# Patient Record
Sex: Male | Born: 1985 | Race: Black or African American | Hispanic: No | Smoking: Current every day smoker
Health system: Southern US, Community
[De-identification: ages and names within clinical notes are randomized; demographics above are authoritative.]

## PROBLEM LIST (undated history)

## (undated) DIAGNOSIS — I10 Essential (primary) hypertension: Secondary | ICD-10-CM

## (undated) DIAGNOSIS — F1910 Other psychoactive substance abuse, uncomplicated: Secondary | ICD-10-CM

## (undated) DIAGNOSIS — K219 Gastro-esophageal reflux disease without esophagitis: Secondary | ICD-10-CM

## (undated) DIAGNOSIS — R569 Unspecified convulsions: Secondary | ICD-10-CM

## (undated) DIAGNOSIS — J45909 Unspecified asthma, uncomplicated: Secondary | ICD-10-CM

## (undated) DIAGNOSIS — K859 Acute pancreatitis without necrosis or infection, unspecified: Secondary | ICD-10-CM

## (undated) DIAGNOSIS — F1020 Alcohol dependence, uncomplicated: Secondary | ICD-10-CM

## (undated) HISTORY — PX: NO PAST SURGERIES: SHX2092

---

## 2004-05-09 ENCOUNTER — Emergency Department: Payer: Self-pay | Admitting: Emergency Medicine

## 2011-08-25 ENCOUNTER — Emergency Department: Payer: Self-pay | Admitting: Internal Medicine

## 2014-08-11 ENCOUNTER — Emergency Department: Payer: Self-pay | Admitting: Student

## 2014-08-13 ENCOUNTER — Emergency Department: Payer: Self-pay | Admitting: Emergency Medicine

## 2014-10-01 LAB — COMPREHENSIVE METABOLIC PANEL
ALT: 60 U/L
ANION GAP: 18 — AB (ref 7–16)
Albumin: 4 g/dL
Alkaline Phosphatase: 159 U/L — ABNORMAL HIGH
BUN: 6 mg/dL
Bilirubin,Total: 0.8 mg/dL
CO2: 22 mmol/L
CREATININE: 0.67 mg/dL
Calcium, Total: 8.9 mg/dL
Chloride: 97 mmol/L — ABNORMAL LOW
Glucose: 126 mg/dL — ABNORMAL HIGH
POTASSIUM: 2.9 mmol/L — AB
SGOT(AST): 172 U/L — ABNORMAL HIGH
SODIUM: 137 mmol/L
Total Protein: 7.9 g/dL

## 2014-10-01 LAB — CBC WITH DIFFERENTIAL/PLATELET
BASOS PCT: 0.3 %
Basophil #: 0.1 10*3/uL (ref 0.0–0.1)
EOS PCT: 0 %
Eosinophil #: 0 10*3/uL (ref 0.0–0.7)
HCT: 52 % (ref 40.0–52.0)
HGB: 17.4 g/dL (ref 13.0–18.0)
Lymphocyte #: 1.9 10*3/uL (ref 1.0–3.6)
Lymphocyte %: 11.1 %
MCH: 32.9 pg (ref 26.0–34.0)
MCHC: 33.4 g/dL (ref 32.0–36.0)
MCV: 99 fL (ref 80–100)
Monocyte #: 0.6 x10 3/mm (ref 0.2–1.0)
Monocyte %: 3.8 %
NEUTROS PCT: 84.8 %
Neutrophil #: 14.1 10*3/uL — ABNORMAL HIGH (ref 1.4–6.5)
Platelet: 206 10*3/uL (ref 150–440)
RBC: 5.28 10*6/uL (ref 4.40–5.90)
RDW: 15.4 % — ABNORMAL HIGH (ref 11.5–14.5)
WBC: 16.7 10*3/uL — AB (ref 3.8–10.6)

## 2014-10-01 LAB — TROPONIN I: Troponin-I: <0.03 ng/mL

## 2014-10-01 LAB — LIPASE, BLOOD: Lipase: 189 U/L — ABNORMAL HIGH

## 2014-10-02 ENCOUNTER — Inpatient Hospital Stay
Admit: 2014-10-02 | Disposition: A | Discharge status: Home / Self Care | Payer: Self-pay | Attending: Internal Medicine | Admitting: Internal Medicine

## 2014-10-02 LAB — BASIC METABOLIC PANEL
ANION GAP: 8 (ref 7–16)
CREATININE: 0.68 mg/dL
Calcium, Total: 8 mg/dL — ABNORMAL LOW
Chloride: 101 mmol/L
Co2: 27 mmol/L
EGFR (African American): >60
EGFR (Non-African Amer.): >60
GLUCOSE: 102 mg/dL — AB
POTASSIUM: 3.6 mmol/L
SODIUM: 136 mmol/L

## 2014-10-02 LAB — URINALYSIS, COMPLETE
BLOOD: NEGATIVE
Bilirubin,UR: NEGATIVE
Glucose,UR: 50 mg/dL (ref 0–75)
Hyaline Cast: 16
LEUKOCYTE ESTERASE: NEGATIVE
NITRITE: NEGATIVE
Ph: 5 (ref 4.5–8.0)
Protein: 100
RBC,UR: 1 /HPF (ref 0–5)
Specific Gravity: 1.026 (ref 1.003–1.030)
Squamous Epithelial: <1

## 2014-10-02 LAB — ETHANOL: Ethanol: 114 mg/dL

## 2014-10-02 LAB — DRUG SCREEN, URINE
Amphetamines, Ur Screen: NEGATIVE
Barbiturates, Ur Screen: NEGATIVE
Benzodiazepine, Ur Scrn: POSITIVE
Cannabinoid 50 Ng, Ur ~~LOC~~: POSITIVE
Cocaine Metabolite,Ur ~~LOC~~: NEGATIVE
MDMA (Ecstasy)Ur Screen: NEGATIVE
METHADONE, UR SCREEN: NEGATIVE
OPIATE, UR SCREEN: POSITIVE
PHENCYCLIDINE (PCP) UR S: NEGATIVE
TRICYCLIC, UR SCREEN: NEGATIVE

## 2014-10-02 LAB — LIPASE, BLOOD: LIPASE: 132 U/L — AB

## 2014-10-02 LAB — CLOSTRIDIUM DIFFICILE(ARMC)

## 2014-10-03 LAB — CBC WITH DIFFERENTIAL/PLATELET
BASOS ABS: 0 10*3/uL (ref 0.0–0.1)
BASOS PCT: 0.2 %
EOS ABS: 0.2 10*3/uL (ref 0.0–0.7)
Eosinophil %: 1.4 %
HCT: 36.6 % — ABNORMAL LOW (ref 40.0–52.0)
HGB: 12.3 g/dL — AB (ref 13.0–18.0)
Lymphocyte #: 1.4 10*3/uL (ref 1.0–3.6)
Lymphocyte %: 9.5 %
MCH: 32.8 pg (ref 26.0–34.0)
MCHC: 33.6 g/dL (ref 32.0–36.0)
MCV: 98 fL (ref 80–100)
Monocyte #: 0.8 x10 3/mm (ref 0.2–1.0)
Monocyte %: 5.4 %
NEUTROS PCT: 83.5 %
Neutrophil #: 12.4 10*3/uL — ABNORMAL HIGH (ref 1.4–6.5)
Platelet: 114 10*3/uL — ABNORMAL LOW (ref 150–440)
RBC: 3.75 10*6/uL — ABNORMAL LOW (ref 4.40–5.90)
RDW: 15 % — AB (ref 11.5–14.5)
WBC: 14.8 10*3/uL — ABNORMAL HIGH (ref 3.8–10.6)

## 2014-10-03 LAB — MAGNESIUM: Magnesium: 1.4 mg/dL — ABNORMAL LOW

## 2014-10-03 LAB — BASIC METABOLIC PANEL
Anion Gap: 10 (ref 7–16)
BUN: <5 mg/dL
CALCIUM: 8.3 mg/dL — AB
Chloride: 101 mmol/L
Co2: 27 mmol/L
Creatinine: 0.64 mg/dL
EGFR (African American): >60
EGFR (Non-African Amer.): >60
Glucose: 57 mg/dL — ABNORMAL LOW
POTASSIUM: 2.8 mmol/L — AB
SODIUM: 138 mmol/L

## 2014-10-03 LAB — LIPASE, BLOOD: LIPASE: 89 U/L — AB

## 2014-10-04 LAB — WBCS, STOOL

## 2014-10-06 LAB — STOOL CULTURE

## 2014-10-07 LAB — CULTURE, BLOOD (SINGLE)

## 2014-10-30 NOTE — H&P (Signed)
PATIENT NAME:  Johnathan Rivera, Johnathan Rivera MR#:  [id number] DATE OF BIRTH:  Jul 04, 1985  DATE OF ADMISSION:  10/02/2014  REFERRING DOCTOR: Caryn Bee A. Paduchowski, MD  PRIMARY CARE PHYSICIAN: None.   ADMITTING DOCTOR: Crissie Figures, MD  CHIEF COMPLAINT: Abdominal pain with associated nausea, vomiting and diarrhea since the morning.    HISTORY OF PRESENT ILLNESS: This is a 29 year old African American male with a past medical history of hypertension, not on any medications; alcohol abuse; history of recreational drug usage, that is marijuana usage, who presents to the Emergency Room with complaints of epigastric pain associated with nausea, vomiting and diarrhea since yesterday morning. The patient stated that he consumed alcohol that is liquor 24 hours prior following which he developed some upper abdominal pain with associated nausea and vomiting and also developed some diarrhea. Hence, he came to the Emergency Room for further evaluation. He denies any fever. No blood in the vomitus or stools. No mucus in the vomitus or stools. The patient stated he has been doing fine before the onset of the symptoms. He denies any urinary symptoms. No chest pain. No shortness of breath. No cough. No focal weakness or numbness. No headaches. In the Emergency Room, the patient was evaluated by the ED physician and was noted to have lab abnormalities significant for leukocytosis, elevated lipase of 189 and elevated liver functions with an AST of 172 and alkaline phosphatase of 159. The patient underwent a right upper quadrant abdominal sonogram which was reported as heterogeneous appearance of the pancreas, consistent with acute pancreatitis. The patient was also noted to have hypokalemia. In view of these abnormalities, the hospitalist service was consulted for further evaluation and management. The patient received multiple doses of IV pain medications and IV fluids in the Emergency Room. He still remains tachycardic, but he  is afebrile and the blood pressure is slightly high in the range of 185/120s.   PAST MEDICAL HISTORY:  1.  Hypertension, not on any medications.  2.  Alcohol usage on a daily basis.  3.  History of marijuana usage.  4.  History of anxiety disorder, not on any medications.   PAST SURGICAL HISTORY: No history of any surgeries.   HOME MEDICATIONS: No home medications.   ALLERGIES: No known drug allergies.   SOCIAL HISTORY: He is married but in the process of getting divorced. He lives at home and works at [employer]. He has a history of smoking of less than 1 pack per day and history of alcohol usage on a daily basis. He uses marijuana. The last time he used was yesterday. Denies any IV drug usage.   FAMILY HISTORY: Significant for father and paternal grandmother with some cancers. Diabetes and hypertension runs on the maternal side.   REVIEW OF SYSTEMS:  CONSTITUTIONAL: Negative for fever, chills, fatigue or generalized weakness.  EYES: Negative for blurred vision or double vision. No pain. No redness. No discharge.  ENT: Negative for ear pain, tinnitus, hearing loss, epistaxis or nasal discharge.  RESPIRATORY: Negative for cough, wheezing, dyspnea, hemoptysis or painful respiration.  CARDIOVASCULAR: Negative for chest pain, palpitations, dizziness, syncopal episodes, orthopnea, dyspnea on exertion or pedal edema.  GASTROINTESTINAL: Positive for nausea, vomiting or diarrhea. Abdominal pain since from yesterday morning as noted in the history of present illness. No blood or mucus in the stools.  GENITOURINARY: Negative for dysuria, frequency, urgency or hematuria.  ENDOCRINE: Negative for polyuria, nocturia or heat or cold intolerance.  HEMATOLOGIC/LYMPHATIC: Negative for any easy bruising, bleeding or  swollen glands.  INTEGUMENTARY: Negative for acne, skin rash or lesions.  MUSCULOSKELETAL: Negative for neck or back pain. No history of arthritis or gout.  NEUROLOGIC: Negative for focal  weakness or numbness. No history of CVA, TIA or seizure disorder.  PSYCHIATRIC: Positive for history of anxiety disorder, not on any medications.   PHYSICAL EXAMINATION:  VITAL SIGNS: Temperature 97.3 degrees Fahrenheit, pulse rate on arrival 143 per minute, current pulse rate 114 per minute, respirations 18 per minute, blood pressure 149/105, O2 saturation 97% on room air.  GENERAL: Well developed, well nourished, alert lying in the bed, in mild distress because of abdominal pain.  HEENT: Head: Atraumatic, normocephalic. Eyes: Pupils are equal and reactive to light and accommodation. No conjunctival pallor. No icterus. Extraocular movements intact. Nose: No drainage. No lesions. Ears: No drainage. No external lesions. Oral cavity: No mucosal lesions. No exudates.  NECK: Supple. No JVD. No thyromegaly. No carotid bruits. Range of motion of neck within normal limits.  RESPIRATORY: Good respiratory effort. Not using accessory muscles of respiration. Bilateral vesicular breath sounds present. No rales or rhonchi.  CARDIOVASCULAR: S1, S2 regular. Tachycardia present. No murmurs, gallops or clicks. Pulses equal at carotid, scattered, femoral and pedal pulses. No peripheral edema.  GASTROINTESTINAL: Abdomen is soft. Diffuse tenderness in the epigastrium present. No guarding. No rigidity. Bowel sounds present and equal in all 4 quadrants.  GENITOURINARY: Deferred.  MUSCULOSKELETAL: No joint tenderness or effusion. Range of motion is adequate. Strength and tone equal bilaterally.  SKIN: Inspection within normal limits. No obvious wounds.  LYMPHATIC: No cervical lymphadenopathy.  VASCULAR: Good dorsalis pedis and posterior tibial pulses.  NEUROLOGIC: Alert, awake and oriented x3. Cranial nerves II through XII grossly intact. No sensory deficit. Motor strength is 5/5 in both upper and lower extremities. DTRs 2+ bilaterally and symmetrical. Plantars are downgoing.  PSYCHIATRIC: Alert, awake and oriented x3.  Judgment and insight adequate. Memory and mood within normal limits.   ANCILLARY DATA:  LABORATORY DATA: Serum glucose 126, BUN 6, creatinine 0.67, sodium 137, potassium 2.9, chloride 97, bicarbonate 22, total calcium 8.9. Lipase 189. Total protein 7.9, albumin 4.0, total bilirubin 0.8, alkaline phosphatase 159, AST 172, ALT 60. Troponin less than 0.03. WBC 16.7, hemoglobin 17.4, hematocrit 52.0, platelet count 206,000.   IMAGING STUDIES:  ULTRASOUND OF THE RIGHT UPPER QUADRANT:  1.  Gallbladder unremarkable in appearance.  2.  Somewhat heterogeneous appearance of the pancreas with trace free fluid surrounding the liver above the pancreas and in the right lower quadrant, likely reflects acute pancreatitis.  3.  Diffuse fatty infiltration within the liver.   EKG: Sinus tachycardia with a ventricular rate of 118 beats per minute. No acute ST-T changes.    ASSESSMENT AND PLAN: This is a 29 year old African American male with a history of alcohol abuse, hypertension and marijuana usage who presents with epigastric abdominal pain with associated nausea, vomiting and diarrhea of 1 day duration who was found to have an elevated lipase, leukocytosis and right upper quadrant sonogram findings consistent with acute pancreatitis.  1.  Acute pancreatitis, likely secondary due to chronic ethanol usage. The plan is to admit to telemetry, n.p.o., intravenous fluids, followup lipase.  2.  Nausea, vomiting and diarrhea of 1-day duration, likely gastritis secondary due to chronic alcohol usage. Rule out infectious causes of diarrhea. The plan is for IV hydration. We will keep him n.p.o. Check stool for stool culture and sensitivity, WBCs and Clostridium difficile toxin.  3.  Hypokalemia, secondary to gastrointestinal losses. The plan  is to replace potassium, telemetry monitoring and follow BMP.  4.  Ethanol usage on a daily base. The plan is to check alcohol levels. We will place on CIWA protocol and monitor closely  for any withdrawal.  5.  History of hypertension, not on any medications. The plan for blood pressure mild elevation is that we will continue with CIWA protocol and use clonidine as needed. Monitor blood pressure closely.  6.  History of marijuana usage, last used 24 hours prior. The plan is to check a urine drug screen and follow up accordingly.  7.  Leukocytosis. No history of fever. No recent illness. No symptoms suggestive of any infection. Likely secondary to stress. We will obtain blood cultures, monitor temperature curves and follow up CBC.  8.  Deep vein thrombosis prophylaxis, on subcutaneous Lovenox.  9.  Gastrointestinal prophylaxis, on PPI.   CODE STATUS: Full code.   TIME SPENT: 50 minutes.    ____________________________ Crissie Figures, MD enr:JT D: 10/02/2014 04:04:05 ET T: 10/02/2014 07:27:56 ET JOB#: [id number]  cc: Crissie Figures, MD, <Dictator> Crissie Figures MD ELECTRONICALLY SIGNED 10/02/2014 18:28

## 2014-10-30 NOTE — Discharge Summary (Signed)
PATIENT NAME:  Johnathan Rivera, Johnathan M MR#:  [id number] DATE OF BIRTH:  Nov 21, 1985  DATE OF ADMISSION:  10/02/2014 DATE OF DISCHARGE:  10/03/2014  ADMISSION DIAGNOSIS:  Acute pancreatitis.  DISCHARGE DIAGNOSES:   1.  Acute pancreatitis.  2.  Alcohol dependence. 3.  Electrolyte abnormalities including low potassium and magnesium.   CONSULTATIONS: None.   PHYSICAL EXAMINATION AT DISCHARGE:  VITAL SIGNS: Temperature is 98.9, pulse is 97, respirations 18, blood pressure 150/89, 99 room air.  GENERAL: The patient was alert, oriented, and in acute distress.  ABDOMEN: Bowel sounds positive. Nontender, nondistended. No hepatosplenomegaly. No organomegaly. No rebound or guarding.  CARDIOVASCULAR: Regular rate and rhythm. No murmurs, gallops, or rubs.  NEUROLOGIC:  Cranial nerves II through XII are intact. No focal deficits. LUNGS:  Clear to auscultation without crackles, rales, or rhonchi.   LABORATORY DATA:  White blood cells 14.8, hemoglobin 12.3, hematocrit 37, platelets are 114, 000.  Sodium 138, potassium 2.8 which was repleted prior to discharge, chloride 101, bicarbonate 27, BUN less than 5, creatinine 0.64, lipase 89.  Magnesium 1.4.   Stool and Clostridium difficile were negative.   Blood culture negative.   HOSPITAL COURSE: A 29 year old male with alcohol dependence who presented with abdominal pain, found to have acute pancreatitis. For further details, please see further dictation.   1.  Acute pancreatitis. The patient had a right upper quadrant ultrasound which showed no evidence of gallstones, but was consistent with pancreatitis. He was placed on pancreatitis protocol including n.p.o., IV fluids, pain control. He was tolerating his diet, abdominal pain free.  His pancreatitis was secondary to alcohol dependence. 2.  Alcohol dependency.  Patient was placed on CIWA protocol.  3.  Electrolyte abnormalities due to alcohol dependency as well as pancreatitis which was repleted  aggressively.   DISCHARGE MEDICATIONS: None.   DISCHARGE DIET: Regular.   DISCHARGE ACTIVITY: As tolerated.   DISCHARGE FOLLOWUP:  The patient will need to follow up with his PCP in the community. The patient was encouraged to stop drinking and case manager was consulted to hand out information to this patient for alcohol dependence. The patient will be discharged.   TIME SPENT: Approximately 40 minutes on discharge.    ____________________________ Meylin Stenzel P. Juliene PinaMody, MD spm:sp D: 10/03/2014 11:48:32 ET T: 10/03/2014 12:10:00 ET JOB#: [id number]  cc: Mathew Storck P. Juliene PinaMody, MD, <Dictator> Janyth ContesSITAL P Nasirah Sachs MD ELECTRONICALLY SIGNED 10/03/2014 12:43

## 2015-01-04 ENCOUNTER — Inpatient Hospital Stay: Payer: Self-pay

## 2015-01-04 ENCOUNTER — Inpatient Hospital Stay
Admission: EM | Admit: 2015-01-04 | Discharge: 2015-01-06 | DRG: 982 | Disposition: A | Discharge status: Home / Self Care | Payer: Self-pay | Attending: Internal Medicine | Admitting: Internal Medicine

## 2015-01-04 DIAGNOSIS — F1020 Alcohol dependence, uncomplicated: Secondary | ICD-10-CM | POA: Diagnosis present

## 2015-01-04 DIAGNOSIS — F10239 Alcohol dependence with withdrawal, unspecified: Secondary | ICD-10-CM | POA: Diagnosis present

## 2015-01-04 DIAGNOSIS — S43492A Other sprain of left shoulder joint, initial encounter: Secondary | ICD-10-CM | POA: Diagnosis present

## 2015-01-04 DIAGNOSIS — F1010 Alcohol abuse, uncomplicated: Secondary | ICD-10-CM

## 2015-01-04 DIAGNOSIS — Z6823 Body mass index (BMI) 23.0-23.9, adult: Secondary | ICD-10-CM

## 2015-01-04 DIAGNOSIS — W1830XA Fall on same level, unspecified, initial encounter: Secondary | ICD-10-CM | POA: Diagnosis present

## 2015-01-04 DIAGNOSIS — R569 Unspecified convulsions: Principal | ICD-10-CM

## 2015-01-04 DIAGNOSIS — F1910 Other psychoactive substance abuse, uncomplicated: Secondary | ICD-10-CM | POA: Insufficient documentation

## 2015-01-04 DIAGNOSIS — F411 Generalized anxiety disorder: Secondary | ICD-10-CM | POA: Diagnosis present

## 2015-01-04 DIAGNOSIS — F410 Panic disorder [episodic paroxysmal anxiety] without agoraphobia: Secondary | ICD-10-CM | POA: Diagnosis present

## 2015-01-04 DIAGNOSIS — F419 Anxiety disorder, unspecified: Secondary | ICD-10-CM | POA: Diagnosis present

## 2015-01-04 DIAGNOSIS — F17210 Nicotine dependence, cigarettes, uncomplicated: Secondary | ICD-10-CM | POA: Diagnosis present

## 2015-01-04 DIAGNOSIS — R52 Pain, unspecified: Secondary | ICD-10-CM

## 2015-01-04 DIAGNOSIS — S01512A Laceration without foreign body of oral cavity, initial encounter: Secondary | ICD-10-CM | POA: Diagnosis present

## 2015-01-04 DIAGNOSIS — E872 Acidosis: Secondary | ICD-10-CM | POA: Diagnosis present

## 2015-01-04 DIAGNOSIS — K760 Fatty (change of) liver, not elsewhere classified: Secondary | ICD-10-CM | POA: Diagnosis present

## 2015-01-04 DIAGNOSIS — Y92009 Unspecified place in unspecified non-institutional (private) residence as the place of occurrence of the external cause: Secondary | ICD-10-CM

## 2015-01-04 DIAGNOSIS — R7989 Other specified abnormal findings of blood chemistry: Secondary | ICD-10-CM | POA: Insufficient documentation

## 2015-01-04 DIAGNOSIS — F10280 Alcohol dependence with alcohol-induced anxiety disorder: Secondary | ICD-10-CM

## 2015-01-04 DIAGNOSIS — R000 Tachycardia, unspecified: Secondary | ICD-10-CM

## 2015-01-04 DIAGNOSIS — K7010 Alcoholic hepatitis without ascites: Secondary | ICD-10-CM | POA: Diagnosis present

## 2015-01-04 DIAGNOSIS — R945 Abnormal results of liver function studies: Secondary | ICD-10-CM

## 2015-01-04 DIAGNOSIS — E876 Hypokalemia: Secondary | ICD-10-CM | POA: Diagnosis present

## 2015-01-04 DIAGNOSIS — F10939 Alcohol use, unspecified with withdrawal, unspecified: Secondary | ICD-10-CM | POA: Diagnosis present

## 2015-01-04 DIAGNOSIS — S4292XA Fracture of left shoulder girdle, part unspecified, initial encounter for closed fracture: Secondary | ICD-10-CM | POA: Diagnosis present

## 2015-01-04 HISTORY — DX: Other psychoactive substance abuse, uncomplicated: F19.10

## 2015-01-04 HISTORY — DX: Unspecified convulsions: R56.9

## 2015-01-04 LAB — URINE DRUG SCREEN, QUALITATIVE (ARMC ONLY)
AMPHETAMINES, UR SCREEN: POSITIVE — AB
Barbiturates, Ur Screen: NOT DETECTED
Benzodiazepine, Ur Scrn: POSITIVE — AB
Cannabinoid 50 Ng, Ur ~~LOC~~: POSITIVE — AB
Cocaine Metabolite,Ur ~~LOC~~: NOT DETECTED
MDMA (Ecstasy)Ur Screen: NOT DETECTED
Methadone Scn, Ur: POSITIVE — AB
OPIATE, UR SCREEN: NOT DETECTED
Phencyclidine (PCP) Ur S: NOT DETECTED
Tricyclic, Ur Screen: POSITIVE — AB

## 2015-01-04 LAB — BASIC METABOLIC PANEL
Anion gap: 12 (ref 5–15)
BUN: 5 mg/dL — AB (ref 6–20)
CALCIUM: 7.9 mg/dL — AB (ref 8.9–10.3)
CO2: 23 mmol/L (ref 22–32)
Chloride: 98 mmol/L — ABNORMAL LOW (ref 101–111)
Creatinine, Ser: 0.75 mg/dL (ref 0.61–1.24)
GFR calc Af Amer: >60 mL/min (ref >60)
GFR calc non Af Amer: >60 mL/min (ref >60)
GLUCOSE: 74 mg/dL (ref 65–99)
Potassium: 3 mmol/L — ABNORMAL LOW (ref 3.5–5.1)
Sodium: 133 mmol/L — ABNORMAL LOW (ref 135–145)

## 2015-01-04 LAB — COMPREHENSIVE METABOLIC PANEL
ALBUMIN: 4.4 g/dL (ref 3.5–5.0)
ALK PHOS: 173 U/L — AB (ref 38–126)
ALT: 127 U/L — ABNORMAL HIGH (ref 17–63)
AST: 1064 U/L — AB (ref 15–41)
Anion gap: 23 — ABNORMAL HIGH (ref 5–15)
BILIRUBIN TOTAL: 1 mg/dL (ref 0.3–1.2)
BUN: 5 mg/dL — AB (ref 6–20)
CO2: 17 mmol/L — ABNORMAL LOW (ref 22–32)
CREATININE: 0.92 mg/dL (ref 0.61–1.24)
Calcium: 9.5 mg/dL (ref 8.9–10.3)
Chloride: 96 mmol/L — ABNORMAL LOW (ref 101–111)
GFR calc Af Amer: >60 mL/min (ref >60)
GFR calc non Af Amer: >60 mL/min (ref >60)
Glucose, Bld: 97 mg/dL (ref 65–99)
Potassium: 3.4 mmol/L — ABNORMAL LOW (ref 3.5–5.1)
Sodium: 136 mmol/L (ref 135–145)
Total Protein: 8.2 g/dL — ABNORMAL HIGH (ref 6.5–8.1)

## 2015-01-04 LAB — LACTIC ACID, PLASMA: Lactic Acid, Venous: 0.8 mmol/L (ref 0.5–2.0)

## 2015-01-04 LAB — MRSA PCR SCREENING: MRSA by PCR: NEGATIVE

## 2015-01-04 LAB — GLUCOSE, CAPILLARY
GLUCOSE-CAPILLARY: 94 mg/dL (ref 65–99)
Glucose-Capillary: 81 mg/dL (ref 65–99)

## 2015-01-04 LAB — ACETAMINOPHEN LEVEL: Acetaminophen (Tylenol), Serum: <10 ug/mL — ABNORMAL LOW (ref 10–30)

## 2015-01-04 LAB — SALICYLATE LEVEL: Salicylate Lvl: <4 mg/dL (ref 2.8–30.0)

## 2015-01-04 LAB — MAGNESIUM: Magnesium: 1.9 mg/dL (ref 1.7–2.4)

## 2015-01-04 LAB — CBC
HCT: 43.4 % (ref 40.0–52.0)
Hemoglobin: 15 g/dL (ref 13.0–18.0)
MCH: 33.7 pg (ref 26.0–34.0)
MCHC: 34.5 g/dL (ref 32.0–36.0)
MCV: 97.8 fL (ref 80.0–100.0)
PLATELETS: 211 10*3/uL (ref 150–440)
RBC: 4.44 MIL/uL (ref 4.40–5.90)
RDW: 14.7 % — ABNORMAL HIGH (ref 11.5–14.5)
WBC: 12.7 10*3/uL — AB (ref 3.8–10.6)

## 2015-01-04 LAB — ETHANOL: ALCOHOL ETHYL (B): 32 mg/dL — AB (ref <5)

## 2015-01-04 LAB — TROPONIN I
Troponin I: <0.03 ng/mL (ref <0.031)
Troponin I: <0.03 ng/mL (ref <0.031)

## 2015-01-04 LAB — RAPID HIV SCREEN (HIV 1/2 AB+AG)
HIV 1/2 Antibodies: NONREACTIVE
HIV-1 P24 Antigen - HIV24: NONREACTIVE

## 2015-01-04 LAB — LIPASE, BLOOD: LIPASE: 68 U/L — AB (ref 22–51)

## 2015-01-04 LAB — PHOSPHORUS: PHOSPHORUS: 2.7 mg/dL (ref 2.5–4.6)

## 2015-01-04 LAB — PROTIME-INR
INR: 0.99
PROTHROMBIN TIME: 13.3 s (ref 11.4–15.0)

## 2015-01-04 MED ORDER — FOLIC ACID 1 MG PO TABS
1.0000 mg | ORAL_TABLET | Freq: Every day | ORAL | Status: DC
  Administered 2015-01-04 – 2015-01-06 (×3): 1 mg via ORAL
  Filled 2015-01-04 (×3): qty 1

## 2015-01-04 MED ORDER — DEXMEDETOMIDINE HCL IN NACL 400 MCG/100ML IV SOLN
0.2000 ug/kg/h | INTRAVENOUS | Status: AC
  Administered 2015-01-04: 0.5 ug/kg/h via INTRAVENOUS
  Filled 2015-01-04: qty 100
  Filled 2015-01-04: qty 50
  Filled 2015-01-04 (×2): qty 100

## 2015-01-04 MED ORDER — LORAZEPAM 2 MG/ML IJ SOLN
INTRAMUSCULAR | Status: AC
  Filled 2015-01-04: qty 1

## 2015-01-04 MED ORDER — LORAZEPAM 2 MG/ML IJ SOLN: 1.0000 mg | Freq: Once | INTRAMUSCULAR | Status: DC

## 2015-01-04 MED ORDER — LORAZEPAM 2 MG/ML IJ SOLN: 1.0000 mg | Freq: Four times a day (QID) | INTRAMUSCULAR | Status: DC | PRN

## 2015-01-04 MED ORDER — AMOXICILLIN-POT CLAVULANATE 875-125 MG PO TABS
1.0000 | ORAL_TABLET | Freq: Two times a day (BID) | ORAL | Status: DC
  Administered 2015-01-04 – 2015-01-05 (×3): 1 via ORAL
  Filled 2015-01-04 (×5): qty 1

## 2015-01-04 MED ORDER — ACETAMINOPHEN 325 MG PO TABS
650.0000 mg | ORAL_TABLET | Freq: Four times a day (QID) | ORAL | Status: DC | PRN
  Administered 2015-01-05 (×2): 650 mg via ORAL
  Filled 2015-01-04 (×2): qty 2

## 2015-01-04 MED ORDER — POTASSIUM CHLORIDE CRYS ER 20 MEQ PO TBCR
40.0000 meq | EXTENDED_RELEASE_TABLET | ORAL | Status: AC
  Administered 2015-01-04 (×3): 40 meq via ORAL
  Filled 2015-01-04 (×2): qty 2

## 2015-01-04 MED ORDER — LORAZEPAM 2 MG/ML IJ SOLN
2.0000 mg | INTRAMUSCULAR | Status: DC | PRN
  Administered 2015-01-05: 2 mg via INTRAVENOUS
  Filled 2015-01-04: qty 1

## 2015-01-04 MED ORDER — ONDANSETRON HCL 4 MG PO TABS: 4.0000 mg | ORAL_TABLET | Freq: Four times a day (QID) | ORAL | Status: DC | PRN

## 2015-01-04 MED ORDER — LIDOCAINE-EPINEPHRINE 2 %-1:100000 IJ SOLN
1.7000 mL | Freq: Once | INTRAMUSCULAR | Status: AC
Start: 2015-01-04 — End: 2015-01-04
  Administered 2015-01-04: 1.7 mL via INTRADERMAL
  Filled 2015-01-04: qty 1.7

## 2015-01-04 MED ORDER — VITAMIN B-1 100 MG PO TABS: 100.0000 mg | ORAL_TABLET | Freq: Every day | ORAL | Status: DC

## 2015-01-04 MED ORDER — LORAZEPAM 2 MG/ML IJ SOLN
1.0000 mg | Freq: Once | INTRAMUSCULAR | Status: AC
  Administered 2015-01-04: 1 mg via INTRAVENOUS

## 2015-01-04 MED ORDER — NITROGLYCERIN 2 % TD OINT
0.5000 [in_us] | TOPICAL_OINTMENT | Freq: Four times a day (QID) | TRANSDERMAL | Status: DC
  Administered 2015-01-04 – 2015-01-06 (×8): 0.5 [in_us] via TOPICAL
  Filled 2015-01-04 (×7): qty 1

## 2015-01-04 MED ORDER — VITAMIN B-1 100 MG PO TABS
100.0000 mg | ORAL_TABLET | Freq: Every day | ORAL | Status: DC
  Administered 2015-01-04 – 2015-01-06 (×3): 100 mg via ORAL
  Filled 2015-01-04 (×3): qty 1

## 2015-01-04 MED ORDER — LORAZEPAM 2 MG/ML IJ SOLN
INTRAMUSCULAR | Status: AC
  Administered 2015-01-04: 1 mg via INTRAVENOUS
  Filled 2015-01-04: qty 1

## 2015-01-04 MED ORDER — ONDANSETRON HCL 4 MG/2ML IJ SOLN: 4.0000 mg | Freq: Four times a day (QID) | INTRAMUSCULAR | Status: DC | PRN

## 2015-01-04 MED ORDER — THIAMINE HCL 100 MG/ML IJ SOLN: 100.0000 mg | Freq: Every day | INTRAMUSCULAR | Status: DC

## 2015-01-04 MED ORDER — SODIUM CHLORIDE 0.9 % IV BOLUS (SEPSIS)
1000.0000 mL | Freq: Once | INTRAVENOUS | Status: AC
  Administered 2015-01-04: 1000 mL via INTRAVENOUS

## 2015-01-04 MED ORDER — ACETAMINOPHEN 650 MG RE SUPP: 650.0000 mg | Freq: Four times a day (QID) | RECTAL | Status: DC | PRN

## 2015-01-04 MED ORDER — MAGNESIUM HYDROXIDE 400 MG/5ML PO SUSP
30.0000 mL | Freq: Every day | ORAL | Status: DC | PRN
Start: 2015-01-04 — End: 2015-01-06

## 2015-01-04 MED ORDER — ADULT MULTIVITAMIN W/MINERALS CH: 1.0000 | ORAL_TABLET | Freq: Every day | ORAL | Status: DC

## 2015-01-04 MED ORDER — SODIUM CHLORIDE 0.9 % IV SOLN
1000.0000 mg | Freq: Once | INTRAVENOUS | Status: AC
  Administered 2015-01-04: 1000 mg via INTRAVENOUS
  Filled 2015-01-04: qty 10

## 2015-01-04 MED ORDER — THIAMINE HCL 100 MG/ML IJ SOLN
Freq: Once | INTRAVENOUS | Status: AC
  Administered 2015-01-04: 13:00:00 via INTRAVENOUS
  Filled 2015-01-04: qty 1000

## 2015-01-04 MED ORDER — LORAZEPAM 1 MG PO TABS: 1.0000 mg | ORAL_TABLET | Freq: Four times a day (QID) | ORAL | Status: DC | PRN

## 2015-01-04 MED ORDER — LIDOCAINE HCL 1 % IJ SOLN
INTRAMUSCULAR | Status: AC
  Filled 2015-01-04: qty 10

## 2015-01-04 MED ORDER — FOLIC ACID 1 MG PO TABS: 1.0000 mg | ORAL_TABLET | Freq: Every day | ORAL | Status: DC

## 2015-01-04 NOTE — Progress Notes (Signed)
   01/04/15 1000  Clinical Encounter Type  Visited With Patient  Visit Type Spiritual support;Initial  Referral From Family  Consult/Referral To Chaplain  Spiritual Encounters  Spiritual Needs Emotional;Grief support;Prayer  Stress Factors  Patient Stress Factors Health changes;Family relationships  Family Stress Factors Health changes;Family relationships  Advance Directives (For Healthcare)  Does patient have an advance directive? No   Chaplain provided empathic listening, therapeutic presence, emotional support and prayer.  AD [phone]

## 2015-01-04 NOTE — ED Provider Notes (Signed)
[facility] Emergency Department Provider Note  ____________________________________________  Time seen: 750  I have reviewed the triage vital signs and the nursing notes.   HISTORY  Chief Complaint Seizures     HPI Johnathan Rivera is a 29 y.o. male who had an seizure or 2 this morning. He was lying on the couch apparently in the next thing he remembers his mother was "hollering, yelling", for him to wake up. A family member called 911 for ambulance and the patient suspects he had another seizure before the ambulance arrived. He reports he had 1 seizure previously. [place] [facility] and released from the emergency department.  The patient has history of alcohol abuse. He reports he does not drink as much as he used to since the seizure earlier this year. Yesterday he drank approximately a pint of "cheap wine". When he had the previous seizure, he had not had something to drink in a number of days and the clinicians at that time thought that that contributed to his seizure.  Patient reports he has problems with anxiety. He is not on any medications for this but occasionally takes Klonopin that is offered to him by a friend. The last Klonopin was on Friday.  Patient is alert and communicative at this time. He denies headache, chest pain, belly pain. He does report his left shoulder hurts.   Past Medical History  Diagnosis Date  . Seizures    One prior seizure Alcohol abuse Anxiety There are no active problems to display for this patient.   History reviewed. No pertinent past surgical history.  Current Outpatient Rx  Name  Route  Sig  Dispense  Refill  . clonazePAM (KLONOPIN) 2 MG tablet   Oral   Take 2 mg by mouth 2 (two) times daily.           Allergies Review of patient's allergies indicates no known allergies.  History reviewed. No pertinent family history.  Social  History History  Substance Use Topics  . Smoking status: Unknown If Ever Smoked  . Smokeless tobacco: Not on file  . Alcohol Use: Yes    Review of Systems  Constitutional: Negative for fever. ENT: Negative for sore throat. Cardiovascular: Negative for chest pain. - Notable tachycardia currently. Respiratory: Negative for shortness of breath. Gastrointestinal: Negative for abdominal pain, vomiting and diarrhea. Genitourinary: Negative for dysuria. Musculoskeletal: Left shoulder pain. Skin: Negative for rash. Neurological: Notable for seizure today and one previous seizure earlier this year. See history of present illness  10-point ROS otherwise negative.  ____________________________________________   PHYSICAL EXAM:  VITAL SIGNS: ED Triage Vitals  Enc Vitals Group     BP --      Pulse --      Resp --      Temp --      Temp src --      SpO2 --      Weight --      Height --      Head Cir --      Peak Flow --      Pain Score --      Pain Loc --      Pain Edu? --      Excl. in GC? --     Constitutional:  Alert and oriented. Well-nourished, well-developed. Tachycardic. Slightly tremulous, otherwise no acute distress ENT   Head: Normocephalic and atraumatic.   Nose: No congestion/rhinnorhea.   Mouth/Throat:  Mucous membranes are moist. There is a laceration to the right tongue. Cardiovascular: Tachycardic at a rate of 150, regular rhythm, no murmur noted Respiratory:  Normal respiratory effort, no tachypnea.    Breath sounds are clear and equal bilaterally.  Gastrointestinal: Soft and nontender. No distention.  Back: No muscle spasm, no tenderness, no CVA tenderness. Musculoskeletal: No deformity noted. Minimal tenderness to palpation left shoulder, but discomfort with movement. Neurologic:  Normal speech and language. No gross focal neurologic deficits are appreciated.  5 over 5 strength in all 4 extremities, equal grip strength, limited ability to assess  for pronator drift due to pain in left shoulder and discomfort with elevating the arm. Sensation is intact. Cranial nerves II through XII are intact. Skin:  Skin is warm, dry. No rash noted. Psychiatric: Mood and affect are normal. Speech and behavior are normal.  ____________________________________________    LABS (pertinent positives/negatives)  CBC: White blood cell count 12.7, hemoglobin 15 A call level XXXII Salicylate and Tylenol levels are both negative   ____________________________________________   EKG  ED ECG REPORT I, Tya Haughey W, the attending physician, personally viewed and interpreted this ECG.   Date: 01/04/2015  EKG Time: 751  Rate: 156  Rhythm: Sinus tachycardia  Axis: Normal  Intervals: Normal  ST&T Change: None noted   ____________________________________________ ____________________________________________    INITIAL IMPRESSION / ASSESSMENT AND PLAN / ED COURSE  Pertinent labs & imaging results that were available during my care of the patient were reviewed by me and considered in my medical decision making (see chart for details).  Pleasant, alert, 29 year old male with history of one prior seizure, now with 2 seizures this morning. He has a bit of a tremor but is otherwise neurologically intact. We are sending for records, including a report from a head CT, to [facility].  I treated the patient with 1 mg of Ativan IV. He is receiving IV fluids due to his ongoing tachycardia in the 150s.  ----------------------------------------- 8:52 AM on 01/04/2015 -----------------------------------------  Heart rate is now 07/31/1935 after Ativan and approximately 750 ML's of normal saline. We'll continue IV fluids.  Patient has a notable laceration to his right tongue. This continues to ooze. I have taken a thin band of Surgicel and applied it to the laceration area. If this does not control the bleeding, we'll consider suturing the tongue, but  the bleeding currently is mild.  ----------------------------------------- 9:45 AM on 01/04/2015 -----------------------------------------  Records from [place] from February 18 had been reviewed. He had a negative head CT at that time. I will not repeat the head CT today as symptoms are similar.  Patient remains alert and without seizure. His heart rate remains in the 130s. Due to this ongoing tachycardia and his not fully evaluated seizure disorder, I discussed admission to the hospital with Dr. Allena Katz.  ____________________________________________   FINAL CLINICAL IMPRESSION(S) / ED DIAGNOSES  Final diagnoses:  Seizure  Tongue laceration, initial encounter  Tachycardia      Darien Ramus, MD 01/04/15 [phone]

## 2015-01-04 NOTE — Progress Notes (Signed)
Informed Dr. Clent RidgesWalsh of patient's results of his x-ray of left arm as well as his tongue continues to bleed. She ordered ortho and ENT consults

## 2015-01-04 NOTE — Consult Note (Signed)
  ORTHOPAEDIC CONSULTATION  REQUESTING PHYSICIAN: Gale Journeyatherine P Walsh, MD  Chief Complaint: Left shoulder pain  HPI: Johnathan Rivera is a 29 y.o. male who complains of  Left shoulder pain after a seizure today witnessed by his mother.  She says he fell against a coffee table. Exam and x-rays in ER showed pain left shoulder without dislocation but chip fx inferiorly.    Past Medical History  Diagnosis Date  . Seizures   . Polysubstance abuse    History reviewed. No pertinent past surgical history. History   Social History  . Marital Status: Legally Separated    Spouse Name: N/A  . Number of Children: 1  . Years of Education: N/A   Social History Main Topics  . Smoking status: Current Every Day Smoker -- 1.00 packs/day    Types: Cigarettes  . Smokeless tobacco: Not on file  . Alcohol Use: 16.8 oz/week    28 Glasses of wine per week  . Drug Use: Yes    Special: Marijuana, Benzodiazepines  . Sexual Activity: Yes   Other Topics Concern  . None   Social History Narrative   This with his mother and has one son age 555   Family History  Problem Relation Age of Onset  . Diabetes Mother   . Liver disease Neg Hx   . Colon cancer Neg Hx    No Known Allergies Prior to Admission medications   Medication Sig Start Date End Date Taking? Authorizing Provider  clonazePAM (KLONOPIN) 2 MG tablet Take 2 mg by mouth 2 (two) times daily.   Yes Historical Provider, MD   Dg Shoulder Left  01/04/2015   CLINICAL DATA:  Status post seizure with a fall today. Left shoulder pain. Initial encounter.  EXAM: LEFT SHOULDER - 2+ VIEW  COMPARISON:  None.  FINDINGS: The humerus is located and the acromioclavicular joint is intact. Small calcifications are seen along the inferior and medial aspect of the humeral neck. Imaged left lung and ribs appear normal.  IMPRESSION: Small ossific fragments projecting along the medial aspect of the humeral neck cannot be definitively characterized. The could be due  to a remote trauma or may represent fragments from acute fracture. CT or MRI could be used for further evaluation.   Electronically Signed   By: Drusilla Kannerhomas  Dalessio M.D.   On: 01/04/2015 13:48    Positive ROS: All other systems have been reviewed and were otherwise negative with the exception of those mentioned in the HPI and as above.  Physical Exam: General: Alert, no acute distress Cardiovascular: No pedal edema Respiratory: No cyanosis, no use of accessory musculature GI: No organomegaly, abdomen is soft and non-tender Skin: No lesions in the area of chief complaint Neurologic: Sensation intact distally Psychiatric: Patient is competent for consent with normal mood and affect Lymphatic: No axillary or cervical lymphadenopathy  MUSCULOSKELETAL: Patient awake and cooperative.  csm good distally left arm.  Pain with range of motion of left shoulder.  No bruising noted.  Appears to be located.  No crepitus.    Assessment: Sprain left shoulder with chip fx.  No evidence of posterior dislocation on x-ray or exam.  Plan: Ice and shoulder immobilizer.  Re-evaluate tomorrow.      Valinda HoarMILLER,Rada Zegers E, MD [phone]   01/04/2015 6:17 PM

## 2015-01-04 NOTE — Consult Note (Signed)
Gastroenterology Consultation  Referring Provider:     Dr Clent Ridges Primary Care Physician:  Evelene Croon, MD Primary Gastroenterologist:  n/a         Reason for Consultation:     Elevated LFTS  Date of Admission:  01/04/2015 Date of Consultation:  01/04/2015        HPI:   Johnathan Rivera is a 29 y.o. male admitted with seizure felt to be due to alcohol withdrawal.  Patient has long-standing history of chronic polysubstance abuse. He had a positive urine drug screen as below. He had denies any history of liver disease. He was consuming more than 20 ounces of wine daily. He reports history of this since age 89 and had recently tried to cut back. He denies any recent IV drug use does have history of intranasal drug use. He also admits to taking his friends "nerve pills". He denies any tattoos or blood transfusions. He has never been told he had hepatitis before. He is slightly jaundiced. His AST, ALT & alkaline phosphatase are significantly elevated. He denies any new medications. He denies any significant acetaminophen use.  Past Medical History  Diagnosis Date  . Seizures   . Polysubstance abuse     History reviewed. No pertinent past surgical history.  Prior to Admission medications   Medication Sig Start Date End Date Taking? Authorizing Provider  clonazePAM (KLONOPIN) 2 MG tablet Take 2 mg by mouth 2 (two) times daily.   Yes Historical Provider, MD    Family History  Problem Relation Age of Onset  . Diabetes Mother   . Liver disease Neg Hx   . Colon cancer Neg Hx      History   Social History Narrative   This with his mother and has one son age 57   History  Substance Use Topics  . Smoking status: Current Every Day Smoker -- 1.00 packs/day    Types: Cigarettes  . Smokeless tobacco: Not on file  . Alcohol Use: 16.8 oz/week    28 Glasses of wine per week    Allergies as of 01/04/2015  . (No Known Allergies)    Review of Systems:    All systems reviewed and  negative except where noted in HPI.   Physical Exam:  Vital signs in last 24 hours: Temp:  [98.7 F (37.1 C)-99.2 F (37.3 C)] 99.2 F (37.3 C) (07/06 1600) Pulse Rate:  [101-153] 101 (07/06 1600) Resp:  [10-23] 23 (07/06 1600) BP: (133-176)/(97-132) 133/97 mmHg (07/06 1600) SpO2:  [95 %-100 %] 100 % (07/06 1600) Weight:  [175 lb (79.379 kg)-175 lb 0.7 oz (79.4 kg)] 175 lb 0.7 oz (79.4 kg) (07/06 1135) Last BM Date: 01/04/15 Body mass index is 23.74 kg/(m^2). General:   Alert,  Well-developed, well-nourished, pleasant and cooperative in NAD Head:  Normocephalic and atraumatic. Eyes:  +MILD icterus.   Conjunctiva pink. Ears:  Normal auditory acuity. Nose:  No deformity, discharge, or lesions. Mouth:  + Right lateral tongue with large hematoma. oropharynx pink & moist. Neck:  Supple; no masses or thyromegaly. Lungs:  Respirations even and unlabored.  Clear throughout to auscultation.   No wheezes, crackles, or rhonchi. No acute distress. Heart:  Regular rate and rhythm; no murmurs, clicks, rubs, or gallops. Abdomen:  Normal bowel sounds.  No bruits.  Soft, non-tender and non-distended without masses, hepatosplenomegaly or hernias noted.  No guarding or rebound tenderness.     Rectal:  Deferred.  Msk:  Symmetrical without gross deformities.  Good, equal  movement & strength bilaterally. Pulses:  Normal pulses noted. Extremities:  No clubbing or edema.  No cyanosis. Neurologic:  Alert and oriented x3;  grossly normal neurologically. Skin:  Intact without significant lesions or rashes. +mild jaundice. Lymph Nodes:  No significant cervical adenopathy. Psych:  Alert and cooperative. Normal mood and affect.  LAB RESULTS:  Recent Labs  01/04/15 0747  WBC 12.7*  HGB 15.0  HCT 43.4  PLT 211   BMET  Recent Labs  01/04/15 0747 01/04/15 1416  NA 136 133*  K 3.4* 3.0*  CL 96* 98*  CO2 17* 23  GLUCOSE 97 74  BUN 5* 5*  CREATININE 0.92 0.75  CALCIUM 9.5 7.9*   LFT  Recent  Labs  01/04/15 0747 01/04/15 1154  PROT 8.2*  --   ALBUMIN 4.4  --   AST 1064*  --   ALT 127*  --   ALKPHOS 173*  --   BILITOT 1.0  --   LIPASE  --  68*   PT/INR  Recent Labs  01/04/15 1154  LABPROT 13.3  INR 0.99   Component     Latest Ref Rng 10/02/2014 01/04/2015  Tricyclic, Ur Screen     NONE DETECTED NEGATIVE POSITIVE (A)  Amphetamines, Ur Screen     NONE DETECTED NEGATIVE POSITIVE (A)  MDMA (Ecstasy)Ur Screen     NONE DETECTED NEGATIVE NONE DETECTED  Cocaine Metabolite,Ur [place]     NONE DETECTED NEGATIVE NONE DETECTED  Opiate, Ur Screen     NONE DETECTED POSITIVE NONE DETECTED  Phencyclidine (PCP) Ur S     NONE DETECTED NEGATIVE NONE DETECTED  Cannabinoid 50 Ng, Ur [place] [address]     NONE DETECTED POSITIVE POSITIVE (A)  Barbiturates, Ur Screen     NONE DETECTED NEGATIVE NONE DETECTED  Benzodiazepine, Ur Scrn     NONE DETECTED POSITIVE POSITIVE (A)  Methadone Scn, Ur     NONE DETECTED  POSITIVE (A)   STUDIES: Dg Shoulder Left  01/04/2015   CLINICAL DATA:  Status post seizure with a fall today. Left shoulder pain. Initial encounter.  EXAM: LEFT SHOULDER - 2+ VIEW  COMPARISON:  None.  FINDINGS: The humerus is located and the acromioclavicular joint is intact. Small calcifications are seen along the inferior and medial aspect of the humeral neck. Imaged left lung and ribs appear normal.  IMPRESSION: Small ossific fragments projecting along the medial aspect of the humeral neck cannot be definitively characterized. The could be due to a remote trauma or may represent fragments from acute fracture. CT or MRI could be used for further evaluation.   Electronically Signed   By: Drusilla Kannerhomas  Dalessio M.D.   On: 01/04/2015 13:48     Impression / Plan:   Johnathan Rivera is a 29 y.o. y/o male with seizure found to have significantly elevated AST, ALT and alkaline phosphatase to a lesser degree. He has history of polysubstance abuse. Differentials include alcoholic hepatitis, secondary to  ischemia or drug effect, or acute viral hepatitis. His bilirubin is low and discriminate function is low. He would not be a candidate for steroidal for alcoholic hepatitis. He will have abdominal ultrasound for further imaging and labs.  Plan: #1 polysubstance abuse cessation was urged #2 continue supportive measures #3 acute hepatitis panel, HIV #4 recheck LFTs and INR in the morning  Thank you for involving me in the care of this patient.  The care of Johnathan Rivera will be discussed in direct collaboration with Dr Midge Miniumarren Wohl, Attending Gastroenterologist.  LOS: 0 days  Lorenza Burton, NP  01/04/2015, 4:12 PM [facility]  [address] [place], [place] [address] Phone: [phone] Fax : [phone]

## 2015-01-04 NOTE — ED Notes (Signed)
MD at bedside to assess tongue laceration to right side of tongue approx 1 inch and currently.

## 2015-01-04 NOTE — ED Notes (Signed)
Seizure precautions began, blankets padding side rails.

## 2015-01-04 NOTE — Consult Note (Signed)
Johnathan Rivera, [place] [id number] August 30, 1985 Johnathan Nearing, MD  Reason for Consult: Tongue laceration with bleeding Requesting Physician: Aldean Jewett, MD Consulting Physician: Johnathan Nearing, MD  HPI: This 29 y.o. year old male was admitted on 01/04/2015 for Seizure [R56.9] Tachycardia [R00.0] Tongue laceration, initial encounter [S01.512A]. The seizure occurred due to alcohol withdrawal. Apparently he bit his tongue during the seizure sustaining a laceration that has continued to slowly bleed since his admission. I am consulted to evaluate the laceration.  Medications:  Current Facility-Administered Medications  Medication Dose Route Frequency Provider Last Rate Last Dose  . acetaminophen (TYLENOL) tablet 650 mg  650 mg Oral Q6H PRN Aldean Jewett, MD       Or  . acetaminophen (TYLENOL) suppository 650 mg  650 mg Rectal Q6H PRN Aldean Jewett, MD      . dexmedetomidine (PRECEDEX) 400 MCG/100ML (4 mcg/mL) infusion  0.2-0.7 mcg/kg/hr Intravenous Continuous Aldean Jewett, MD 9.9 mL/hr at 01/04/15 1151 0.5 mcg/kg/hr at 01/04/15 1151  . folic acid (FOLVITE) tablet 1 mg  1 mg Oral Daily Aldean Jewett, MD   1 mg at 01/04/15 1314  . LORazepam (ATIVAN) 2 MG/ML injection           . LORazepam (ATIVAN) injection 2-3 mg  2-3 mg Intravenous Q1H PRN Aldean Jewett, MD      . magnesium hydroxide (MILK OF MAGNESIA) suspension 30 mL  30 mL Oral Daily PRN Aldean Jewett, MD      . nitroGLYCERIN (NITROGLYN) 2 % ointment 0.5 inch  0.5 inch Topical 4 times per day Aldean Jewett, MD   0.5 inch at 01/04/15 1818  . ondansetron (ZOFRAN) tablet 4 mg  4 mg Oral Q6H PRN Aldean Jewett, MD       Or  . ondansetron [facility]) injection 4 mg  4 mg Intravenous Q6H PRN Aldean Jewett, MD      . potassium chloride SA (K-DUR,KLOR-CON) CR tablet 40 mEq  40 mEq Oral Q4H Aldean Jewett, MD   40 mEq at 01/04/15 1625  . thiamine (VITAMIN B-1) tablet 100 mg  100 mg Oral Daily Aldean Jewett, MD   100 mg at 01/04/15 1314  .  Medications Prior to Admission  Medication Sig Dispense Refill  . clonazePAM (KLONOPIN) 2 MG tablet Take 2 mg by mouth 2 (two) times daily.      Allergies: No Known Allergies  PMH:  Past Medical History  Diagnosis Date  . Seizures   . Polysubstance abuse     Fam Hx:  Family History  Problem Relation Age of Onset  . Diabetes Mother   . Liver disease Neg Hx   . Colon cancer Neg Hx     Soc Hx:  History   Social History  . Marital Status: Legally Separated    Spouse Name: N/A  . Number of Children: 1  . Years of Education: N/A   Occupational History  . Not on file.   Social History Main Topics  . Smoking status: Current Every Day Smoker -- 1.00 packs/day    Types: Cigarettes  . Smokeless tobacco: Not on file  . Alcohol Use: 16.8 oz/week    28 Glasses of wine per week  . Drug Use: Yes    Special: Marijuana, Benzodiazepines  . Sexual Activity: Yes   Other Topics Concern  . Not on file   Social History Narrative   This with his mother and has one son age  5    PSH: History reviewed. No pertinent past surgical history.. Procedures since admission: No admission procedures for hospital encounter.  ROS: Review of systems normal other than 12 systems except per HPI.  PHYSICAL EXAM  Vitals: Blood pressure 133/97, pulse 102, temperature 99.2 F (37.3 C), temperature source Oral, resp. rate 18, height 6' (1.829 m), weight 79.4 kg (175 lb 0.7 oz), SpO2 100 %.. General: Well-developed, Well-nourished in no acute distress Mood: Mood and affect well adjusted, pleasant and cooperative. Orientation: Grossly alert and oriented. Vocal Quality: No hoarseness. Communicates verbally. head and Face: NCAT. No facial asymmetry. No visible skin lesions. No significant facial scars. No tenderness with sinus percussion. Facial strength normal and symmetric. Ears: External ears with normal landmarks, no lesions. External auditory canals free of  infection, cerumen impaction or lesions. Tympanic membranes intact with good landmarks and normal mobility on pneumatic otoscopy. No middle ear effusion. Hearing: Speech reception grossly normal. Nose: External nose normal with midline dorsum and no lesions or deformity. Nasal Cavity reveals essentially midline septum with normal inferior turbinates. No significant mucosal congestion or erythema. Nasal secretions are minimal and clear. No polyps seen on anterior rhinoscopy. Oral Cavity/ Oropharynx: Lips are normal with no lesions. Teeth no frank dental caries. Gingiva healthy with no lesions or gingivitis. Oropharynx including buccal mucosa, floor of mouth, hard and soft palate, uvula and posterior pharynx free of exudates, erythema or lesions with normal symmetry and hydration. There is a 3 cm laceration horizontally across the lateral tongue on the right side with slow venous oozing. The laceration appears to be under a centimeter deep. The tongue is swollen on the right side but not tense with no evidence of deep hematoma. Left side of the tongue is soft. Indirect Laryngoscopy/Nasopharyngoscopy: Visualization of the larynx, hypopharynx and nasopharynx is not possible in this setting with routine examination. Neck: Supple and symmetric with no palpable masses, tenderness or crepitance. The trachea is midline. Thyroid gland is soft, nontender and symmetric with no masses or enlargement. Parotid and submandibular glands are soft, nontender and symmetric, without masses. Lymphatic: Cervical lymph nodes are without palpable lymphadenopathy or tenderness. Respiratory: Normal respiratory effort without labored breathing. Cardiovascular: Carotid pulse shows regular rate and rhythm Neurologic: Cranial Nerves II through XII are grossly intact. Eyes: Gaze and Ocular Motility are grossly normal. PERRLA. No visible nystagmus.  MEDICAL DECISION MAKING: Data Review:  Results for orders placed or performed during  the hospital encounter of 01/04/15 (from the past 48 hour(s))  MRSA PCR Screening     Status: None   Collection Time: 01/04/15  7:45 AM  Result Value Ref Range   MRSA by PCR NEGATIVE NEGATIVE    Comment:        The GeneXpert MRSA Assay (FDA approved for NASAL specimens only), is one component of a comprehensive MRSA colonization surveillance program. It is not intended to diagnose MRSA infection nor to guide or monitor treatment for MRSA infections.   Acetaminophen level     Status: Abnormal   Collection Time: 01/04/15  7:47 AM  Result Value Ref Range   Acetaminophen (Tylenol), Serum <10 (L) 10 - 30 ug/mL    Comment:        THERAPEUTIC CONCENTRATIONS VARY SIGNIFICANTLY. A RANGE OF 10-30 ug/mL MAY BE AN EFFECTIVE CONCENTRATION FOR MANY PATIENTS. HOWEVER, SOME ARE BEST TREATED AT CONCENTRATIONS OUTSIDE THIS RANGE. ACETAMINOPHEN CONCENTRATIONS >150 ug/mL AT 4 HOURS AFTER INGESTION AND >50 ug/mL AT 12 HOURS AFTER INGESTION ARE OFTEN ASSOCIATED WITH TOXIC REACTIONS.  Ethanol     Status: Abnormal   Collection Time: 01/04/15  7:47 AM  Result Value Ref Range   Alcohol, Ethyl (B) 32 (H) <5 mg/dL    Comment:        LOWEST DETECTABLE LIMIT FOR SERUM ALCOHOL IS 5 mg/dL FOR MEDICAL PURPOSES ONLY   Salicylate level     Status: None   Collection Time: 01/04/15  7:47 AM  Result Value Ref Range   Salicylate Lvl <2.3 2.8 - 30.0 mg/dL  CBC     Status: Abnormal   Collection Time: 01/04/15  7:47 AM  Result Value Ref Range   WBC 12.7 (H) 3.8 - 10.6 K/uL   RBC 4.44 4.40 - 5.90 MIL/uL   Hemoglobin 15.0 13.0 - 18.0 g/dL   HCT 43.4 40.0 - 52.0 %   MCV 97.8 80.0 - 100.0 fL   MCH 33.7 26.0 - 34.0 pg   MCHC 34.5 32.0 - 36.0 g/dL   RDW 14.7 (H) 11.5 - 14.5 %   Platelets 211 150 - 440 K/uL  Comprehensive metabolic panel     Status: Abnormal   Collection Time: 01/04/15  7:47 AM  Result Value Ref Range   Sodium 136 135 - 145 mmol/L   Potassium 3.4 (L) 3.5 - 5.1 mmol/L   Chloride 96  (L) 101 - 111 mmol/L   CO2 17 (L) 22 - 32 mmol/L   Glucose, Bld 97 65 - 99 mg/dL   BUN 5 (L) 6 - 20 mg/dL   Creatinine, Ser 0.92 0.61 - 1.24 mg/dL   Calcium 9.5 8.9 - 10.3 mg/dL   Total Protein 8.2 (H) 6.5 - 8.1 g/dL   Albumin 4.4 3.5 - 5.0 g/dL   AST 1064 (H) 15 - 41 U/L   ALT 127 (H) 17 - 63 U/L   Alkaline Phosphatase 173 (H) 38 - 126 U/L   Total Bilirubin 1.0 0.3 - 1.2 mg/dL   GFR calc non Af Amer >60 >60 mL/min   GFR calc Af Amer >60 >60 mL/min    Comment: (NOTE) The eGFR has been calculated using the CKD EPI equation. This calculation has not been validated in all clinical situations. eGFR's persistently <60 mL/min signify possible Chronic Kidney Disease.    Anion gap 23 (H) 5 - 15  Magnesium     Status: None   Collection Time: 01/04/15  7:47 AM  Result Value Ref Range   Magnesium 1.9 1.7 - 2.4 mg/dL  Glucose, capillary     Status: None   Collection Time: 01/04/15  8:05 AM  Result Value Ref Range   Glucose-Capillary 94 65 - 99 mg/dL  Urine Drug Screen, Qualitative ([facility] only)     Status: Abnormal   Collection Time: 01/04/15  8:51 AM  Result Value Ref Range   Tricyclic, Ur Screen POSITIVE (A) NONE DETECTED   Amphetamines, Ur Screen POSITIVE (A) NONE DETECTED   MDMA (Ecstasy)Ur Screen NONE DETECTED NONE DETECTED   Cocaine Metabolite,Ur [place] NONE DETECTED NONE DETECTED   Opiate, Ur Screen NONE DETECTED NONE DETECTED   Phencyclidine (PCP) Ur S NONE DETECTED NONE DETECTED   Cannabinoid 50 Ng, Ur [place] POSITIVE (A) NONE DETECTED   Barbiturates, Ur Screen NONE DETECTED NONE DETECTED   Benzodiazepine, Ur Scrn POSITIVE (A) NONE DETECTED   Methadone Scn, Ur POSITIVE (A) NONE DETECTED    Comment: (NOTE) 557  Tricyclics, urine               Cutoff 1000 ng/mL 200  Amphetamines, urine  Cutoff 1000 ng/mL 300  MDMA (Ecstasy), urine           Cutoff 500 ng/mL 400  Cocaine Metabolite, urine       Cutoff 300 ng/mL 500  Opiate, urine                   Cutoff 300 ng/mL 600   Phencyclidine (PCP), urine      Cutoff 25 ng/mL 700  Cannabinoid, urine              Cutoff 50 ng/mL 800  Barbiturates, urine             Cutoff 200 ng/mL 900  Benzodiazepine, urine           Cutoff 200 ng/mL 1000 Methadone, urine                Cutoff 300 ng/mL 1100 1200 The urine drug screen provides only a preliminary, unconfirmed 1300 analytical test result and should not be used for non-medical 1400 purposes. Clinical consideration and professional judgment should 1500 be applied to any positive drug screen result due to possible 1600 interfering substances. A more specific alternate chemical method 1700 must be used in order to obtain a confirmed analytical result.  1800 Gas chromato graphy / mass spectrometry (GC/MS) is the preferred 1900 confirmatory method.   Glucose, capillary     Status: None   Collection Time: 01/04/15 11:44 AM  Result Value Ref Range   Glucose-Capillary 81 65 - 99 mg/dL  Protime-INR     Status: None   Collection Time: 01/04/15 11:54 AM  Result Value Ref Range   Prothrombin Time 13.3 11.4 - 15.0 seconds   INR 0.99   Lactic acid, plasma     Status: None   Collection Time: 01/04/15 11:54 AM  Result Value Ref Range   Lactic Acid, Venous 0.8 0.5 - 2.0 mmol/L  Phosphorus     Status: None   Collection Time: 01/04/15 11:54 AM  Result Value Ref Range   Phosphorus 2.7 2.5 - 4.6 mg/dL  Lipase, blood     Status: Abnormal   Collection Time: 01/04/15 11:54 AM  Result Value Ref Range   Lipase 68 (H) 22 - 51 U/L  Troponin I (q 6hr x 3)     Status: None   Collection Time: 01/04/15 11:54 AM  Result Value Ref Range   Troponin I <0.03 <0.031 ng/mL    Comment:        NO INDICATION OF MYOCARDIAL INJURY.   Basic metabolic panel     Status: Abnormal   Collection Time: 01/04/15  2:16 PM  Result Value Ref Range   Sodium 133 (L) 135 - 145 mmol/L   Potassium 3.0 (L) 3.5 - 5.1 mmol/L   Chloride 98 (L) 101 - 111 mmol/L   CO2 23 22 - 32 mmol/L   Glucose, Bld 74 65 -  99 mg/dL   BUN 5 (L) 6 - 20 mg/dL   Creatinine, Ser 0.75 0.61 - 1.24 mg/dL   Calcium 7.9 (L) 8.9 - 10.3 mg/dL   GFR calc non Af Amer >60 >60 mL/min   GFR calc Af Amer >60 >60 mL/min    Comment: (NOTE) The eGFR has been calculated using the CKD EPI equation. This calculation has not been validated in all clinical situations. eGFR's persistently <60 mL/min signify possible Chronic Kidney Disease.    Anion gap 12 5 - 15  . Dg Shoulder Left  01/04/2015   CLINICAL DATA:  Status post seizure with a fall today. Left shoulder pain. Initial encounter.  EXAM: LEFT SHOULDER - 2+ VIEW  COMPARISON:  None.  FINDINGS: The humerus is located and the acromioclavicular joint is intact. Small calcifications are seen along the inferior and medial aspect of the humeral neck. Imaged left lung and ribs appear normal.  IMPRESSION: Small ossific fragments projecting along the medial aspect of the humeral neck cannot be definitively characterized. The could be due to a remote trauma or may represent fragments from acute fracture. CT or MRI could be used for further evaluation.   Electronically Signed   By: Inge Rise M.D.   On: 01/04/2015 13:48  .  01/04/2015  6:20 PM    Pre-Op Diagnosis:  Right oral tongue laceration Post-op Diagnosis: Same  Procedure:   Simple closure of right oral tongue laceration (3 cm) Surgeon:  Johnathan Rivera  Anesthesia:  Local 1% lidocaine with epinephrine 1 100,000  EBL:  Minimal  Complications:  None  Findings: 3 cm horizontal tongue laceration on the lateral aspect of the right oral tongue, under a centimeter deep  Procedure: After discussing the procedure with the patient, the right side of the tongue was injected with 1% lidocaine with epinephrine 1:100,000 for anesthesia as well as to help with hemostasis. There was some old blood clot at the edge of the wound which was debrided away. The wound was inspected and there was no evidence of any deep hematoma formation. 3  simple interrupted sutures with buried knots were placed, using 3-0 Vicryl. This loosely closed the wound providing some compression and hemostasis, but still allowed some egress of any small amounts of blood for drainage purposes. The patient tolerated the procedure well.  ASSESSMENT: Right oral tongue laceration PLAN: Simple closure of the laceration was performed at bedside. Would recommend Augmentin 805 mg by mouth twice a day for 10 days to reduce risk of infection in the wound. The sutures are absorbable, so no follow-up is necessary for removal. He may eat a soft diet.   Johnathan Nearing, MD 01/04/2015 6:19 PM

## 2015-01-04 NOTE — ED Notes (Signed)
Pt here via [facility] from home c/o seizure like activity. Hx of seizures. Mother reports to EMS that last time patient had seizure was related to alcohol withdrawal. Last known alcohol use yesterday, also mariajuana use and klonopin use recently. Pt has dried blood to mouth, also c/o left shoulder pain since seizure. Pt fell off couch at time of seizure. Pt alert and oriented X4, slow to orient to month. Pt tachycardic at time of EMS arrival and at time of hospital arrival. Pt states that he is anxious.

## 2015-01-04 NOTE — H&P (Signed)
[facility] - [place] at [facility]   PATIENT NAME: Johnathan Rivera    MR#:  [id number]  DATE OF BIRTH:  Oct 16, 1985  DATE OF ADMISSION:  01/04/2015  PRIMARY CARE PHYSICIAN: Evelene Croon, MD   REQUESTING/REFERRING PHYSICIAN: Dr Carollee Massed   CHIEF COMPLAINT:   Chief Complaint  Patient presents with  . Seizures    HISTORY OF PRESENT ILLNESS:  Johnathan Rivera  is a 29 y.o. male with a known history of alcohol abuse and alcohol withdrawal seizures presents today after seizure this morning. He reports that he has been trying to cut down on his alcohol intake. Current average alcohol intake is one bottle of wine daily, this is down from prior consumption. He has not had a drink in about 24 hours. This morning he woke up and was speaking to his mother when he stopped speaking and his body became very tight, he began shaking slid slowly to his side and then onto the floor where he laid until EMS arrived about 5 minutes later. He remembers his mother yelling for him to wake up and EMS arriving. He was not confused upon waking, no neurologic changes. Does have a large laceration on the right side of his tongue.  On emergency room evaluation he is hypertensive at 176/114 and tachycardic at 121. He feels shaky and anxious and is withdrawing from alcohol at this time. He is being admitted to the stepdown unit for treatment of alcohol withdrawal. He reports that he would like to quit drinking and be treated for anxiety. He has been self-medicating with Klonopin which she obtains from a friend as well his alcohol.  PAST MEDICAL HISTORY:   Past Medical History  Diagnosis Date  . Seizures     PAST SURGICAL HISTORY:  History reviewed. No pertinent past surgical history.  SOCIAL HISTORY:   History  Substance Use Topics  . Smoking status: Current Every Day Smoker -- 1.00 packs/day    Types: Cigarettes  . Smokeless tobacco: Not on file  . Alcohol Use: 16.8  oz/week    28 Glasses of wine per week    FAMILY HISTORY:   Family History  Problem Relation Age of Onset  . Diabetes Mother   . Cirrhosis Other     DRUG ALLERGIES:  No Known Allergies  REVIEW OF SYSTEMS:   Review of Systems  Constitutional: Positive for weight loss, malaise/fatigue and diaphoresis. Negative for fever and chills.  HENT: Negative for congestion and hearing loss.   Eyes: Negative for blurred vision and pain.  Respiratory: Negative for cough, hemoptysis, sputum production, shortness of breath and stridor.   Cardiovascular: Negative for chest pain, palpitations, orthopnea and leg swelling.  Gastrointestinal: Negative for nausea, vomiting, abdominal pain, diarrhea, constipation and blood in stool.  Genitourinary: Negative for dysuria and frequency.  Musculoskeletal: Negative for myalgias, back pain, joint pain and neck pain.  Skin: Negative for rash.  Neurological: Positive for tremors and seizures. Negative for dizziness, tingling, sensory change, speech change, focal weakness, loss of consciousness and headaches.  Endo/Heme/Allergies: Does not bruise/bleed easily.  Psychiatric/Behavioral: Positive for substance abuse. Negative for depression, suicidal ideas and hallucinations. The patient is nervous/anxious.     MEDICATIONS AT HOME:   Prior to Admission medications   Medication Sig Start Date End Date Taking? Authorizing Provider  clonazePAM (KLONOPIN) 2 MG tablet Take 2 mg by mouth 2 (two) times daily.   Yes Historical Provider, MD      VITAL SIGNS:  Blood pressure 176/114, pulse  121, temperature 99 F (37.2 C), temperature source Oral, resp. rate 16, height 6' (1.829 m), weight 79.379 kg (175 lb), SpO2 98 %.  PHYSICAL EXAMINATION:  GENERAL:  29 y.o.-year-old patient lying in the bed with no acute distress.  EYES: Pupils equal, round, reactive to light and accommodation. No scleral icterus. Extraocular muscles intact.  HEENT: Head atraumatic,  normocephalic. Oropharynx is full of bright red blood there is a laceration on the right side of the tongue NECK:  Supple, no jugular venous distention. No thyroid enlargement, no tenderness.  LUNGS: Normal breath sounds bilaterally, no wheezing, rales,rhonchi or crepitation. No use of accessory muscles of respiration.  CARDIOVASCULAR: S1, S2 normal. No murmurs, rubs, or gallops. Tachycardic, regular  ABDOMEN: Soft, nontender, nondistended. Bowel sounds present. No organomegaly or mass. No guarding or rebound EXTREMITIES: No pedal edema, cyanosis, or clubbing. Peripheral pulses are 2+ NEUROLOGIC: Cranial nerves II through XII are intact. Muscle strength 5/5 in all extremities. Sensation intact. Gait not checked. Tremulous PSYCHIATRIC: The patient is alert and oriented x 3. Anxious SKIN: No obvious rash, lesion, or ulcer.   LABORATORY PANEL:   CBC  Recent Labs Lab 01/04/15 0747  WBC 12.7*  HGB 15.0  HCT 43.4  PLT 211   ------------------------------------------------------------------------------------------------------------------  Chemistries   Recent Labs Lab 01/04/15 0747  NA 136  K 3.4*  CL 96*  CO2 17*  GLUCOSE 97  BUN 5*  CREATININE 0.92  CALCIUM 9.5  MG 1.9  AST 1064*  ALT 127*  ALKPHOS 173*  BILITOT 1.0   ------------------------------------------------------------------------------------------------------------------  Cardiac Enzymes No results for input(s): TROPONINI in the last 168 hours. ------------------------------------------------------------------------------------------------------------------  RADIOLOGY:  No results found.  EKG:   Sinus tachycardia  IMPRESSION AND PLAN:   Principal Problem:   Alcohol withdrawal seizure Active Problems:   Alcohol abuse   Anxiety   Alcoholic hepatitis  Problem #1 seizure due to alcohol withdrawal: He has had one seizure in the past in February of this year also related to alcohol withdrawal. He was  not admitted at that time. He is not post ictal. He has had no further seizure events in the emergency room. He is being admitted with seizure precautions. He will be treated for alcohol withdrawal. He has received 1 g of Keppra in the emergency room. I will not continue this as I believe his symptoms are more likely due to alcohol withdrawal. No CT this admission as there is a recent CT from February [facility] which was normal.  #2 alcohol abuse with current withdrawal: Patient would like to stop drinking. He is currently in withdrawal and has had one or 2 seizures already this morning. He is being placed on the step down unit CIWA protocol with every one hour assessments and IV Ativan as needed. Will also receive thiamine and folate replacement. Social work consult placed for current substance abuse. Of note his UDS is also positive for amphetamine, benzodiazepine, methadone, cannabinoids, try cyclic antidepressives.  #3 generalized anxiety disorder: Patient reports that without the use of alcohol or Klonopin he has significant anxiety that impacts his daily life. Psychiatry has been consult and for further evaluation and treatment  #4 alcoholic hepatitis: Transaminitis in typical pattern with greatly elevated AST indicative of alcoholic hepatitis. Gastroenterology consultation. Will check a PT. Bilirubin is normal. No indication for steroid treatment at this time.  #5 hypokalemia: Likely due to poor nutrition. Will also check phosphate and magnesium. Replace potassium. Monitor for refeeding syndrome  #6 anion gap metabolic acidosis:  Likely due to alcohol and possible lactic acidosis after seizure activity. Continue hydration and recheck labs this afternoon.  prophylaxis: SCDs for DVT prophylaxis. Hold pharmacological DVT prophylaxis due to current bleeding in the mouth and high risk of seizure with injury. Protonix for GI prophylaxis   All the records are reviewed and case discussed with  ED provider. Management plans discussed with the patient, family and they are in agreement. Data than 50% of time spent in care coordination and counseling. Care plan discussed with the patient and his mother at the bedside on admission.  CODE STATUS: FULL  TOTAL TIME TAKING CARE OF THIS PATIENT: 55 minutes.    Elby Showers M.D on 01/04/2015 at 11:04 AM  Between 7am to 6pm - Pager - [phone]  After 6pm go to [email or web address] - password [email or web address] [facility]  [facility]  Office  [phone]  CC: Primary care physician; Evelene Croon, MD

## 2015-01-04 NOTE — ED Notes (Addendum)
Pt appears anxious, shaking in hands with movements. Mother states that patient has had shakes since childhood. Pt appears tearful. States last use of alcohol was yesterday, reports that it was 1 pint of "cheap wine" and took a friends klonopin on Monday for anxiety; hx of such. Mother at bedside with patient now. Pt back to baseline per family. Pt has no recollection of what happened between time prior to seizure and until EMS arrival. Dried blood to mouth, pt thinks he may have bit tongue when he seized.

## 2015-01-04 NOTE — ED Notes (Signed)
MD at bedside. 

## 2015-01-04 NOTE — Progress Notes (Signed)
Dr. Willeen CassBennett (ENT) in to assess patient and sutured the laceration to the right side of his tongue. Dr. Hyacinth MeekerMiller (ortho) assessed his left shoulder and ordered an elastic shoulder mobilizer. Patient's mother in the room and updated on care

## 2015-01-04 NOTE — Progress Notes (Signed)
Patient remains hypertensive with blood pressure of 175/115. Dr. Clent RidgesWalsh notified. I also notified her of patient's complaint of pain in left arm and he reports he fell on arm this morning during the seizure.

## 2015-01-04 NOTE — Progress Notes (Signed)
   01/04/15 2030  Clinical Encounter Type  Visited With Family  Visit Type Follow-up  Referral From Chaplain  Consult/Referral To Chaplain  Spiritual Encounters  Spiritual Needs Emotional;Prayer  Stress Factors  Family Stress Factors Exhausted;Health changes  Met w/patient's mother. Provided pastoral care and prayer. Chap. Rona Tomson G. [place]

## 2015-01-04 NOTE — ED Notes (Signed)
Admitting doctor at bedside 

## 2015-01-04 NOTE — Consult Note (Signed)
[facility] Psychiatry Consult   Reason for Consult:  To evaluate the patient with alcoholism and anxiety. Referring Physician:  Dr. Volanda Napoleon Patient Identification: Johnathan Rivera MRN:  [id number] Principal Diagnosis: Alcohol withdrawal seizure Diagnosis:   Patient Active Problem List   Diagnosis Date Noted  . Alcohol abuse [F10.10] 01/04/2015  . Alcohol withdrawal seizure [F10.239] 01/04/2015  . Anxiety [F41.9] 01/04/2015  . Alcoholic hepatitis [R91.63] 01/04/2015    Total Time spent with patient: 45 minutes  Subjective:   Johnathan Rivera is a 29 y.o. male patient admitted with seizures in the context of alcohol withdrawal..  HPI:   Identifying data. Johnathan Rivera is a 29 year old male with history of alcoholism and substance use.  Chief complaint. I was trying to slow down.  History of present illness. Johnathan Rivera Drinking at the Age of 68 and Has Been Consuming Alcohol at the Rate of At Least 1 Bottle of Wine Daily. Recently he was trying to curb his drinking and was admitted to [facility] with alcohol withdrawal seizures. In addition to alcohol the patient is positive for multiple other substances including benzodiazepines. He reports severe anxiety for which he finds drinking helpful. On occasion when having a panic attack he would take clonazepam that is prescribed for her friend. He minimizes other substance use even though is positive for opiates and the stimulants. He does realize that drinking is a problem and would like to stop. Prior to coming to the hospital with his mother they found the program in the [place] [facility] that he wants to participate in following discharge. He is not interested in any other treatment. He has a friend who completed the program and has been very successful. The patient denies any symptoms of depression or psychosis. He denies symptoms suggestive of bipolar mania. He endorses anxiety with panic  attacks.  Past psychiatric history. He's never been seen by a psychiatrist in the community. He does not take psychotherapy medications that are prescribed. He denies suicide attempts. He has never been hospitalized. He went to substance abuse treatment program in [facility] a few years back and was able to maintain sobriety for several months.  Family psychiatric history. Nonreported.   social history. He lives with his mother. He does all the jobs and provides transportation for friends and neighbors as he owns a car. He has not been employed lately.    Past Medical History:  Past Medical History  Diagnosis Date  . Seizures    History reviewed. No pertinent past surgical history. Family History:  Family History  Problem Relation Age of Onset  . Diabetes Mother   . Cirrhosis Other    Social History:  History  Alcohol Use  . 16.8 oz/week  . 28 Glasses of wine per week     History  Drug Use  . Yes  . Special: Marijuana, Benzodiazepines    History   Social History  . Marital Status: Legally Separated    Spouse Name: N/A  . Number of Children: N/A  . Years of Education: N/A   Social History Main Topics  . Smoking status: Current Every Day Smoker -- 1.00 packs/day    Types: Cigarettes  . Smokeless tobacco: Not on file  . Alcohol Use: 16.8 oz/week    28 Glasses of wine per week  . Drug Use: Yes    Special: Marijuana, Benzodiazepines  . Sexual Activity: Not on file   Other Topics Concern  . None  Social History Narrative  . None   Additional Social History:                          Allergies:  No Known Allergies  Labs:  Results for orders placed or performed during the hospital encounter of 01/04/15 (from the past 48 hour(s))  MRSA PCR Screening     Status: None   Collection Time: 01/04/15  7:45 AM  Result Value Ref Range   MRSA by PCR NEGATIVE NEGATIVE    Comment:        The GeneXpert MRSA Assay (FDA approved for NASAL specimens only), is  one component of a comprehensive MRSA colonization surveillance program. It is not intended to diagnose MRSA infection nor to guide or monitor treatment for MRSA infections.   Acetaminophen level     Status: Abnormal   Collection Time: 01/04/15  7:47 AM  Result Value Ref Range   Acetaminophen (Tylenol), Serum <10 (L) 10 - 30 ug/mL    Comment:        THERAPEUTIC CONCENTRATIONS VARY SIGNIFICANTLY. A RANGE OF 10-30 ug/mL MAY BE AN EFFECTIVE CONCENTRATION FOR MANY PATIENTS. HOWEVER, SOME ARE BEST TREATED AT CONCENTRATIONS OUTSIDE THIS RANGE. ACETAMINOPHEN CONCENTRATIONS >150 ug/mL AT 4 HOURS AFTER INGESTION AND >50 ug/mL AT 12 HOURS AFTER INGESTION ARE OFTEN ASSOCIATED WITH TOXIC REACTIONS.   Ethanol     Status: Abnormal   Collection Time: 01/04/15  7:47 AM  Result Value Ref Range   Alcohol, Ethyl (B) 32 (H) <5 mg/dL    Comment:        LOWEST DETECTABLE LIMIT FOR SERUM ALCOHOL IS 5 mg/dL FOR MEDICAL PURPOSES ONLY   Salicylate level     Status: None   Collection Time: 01/04/15  7:47 AM  Result Value Ref Range   Salicylate Lvl <1.7 2.8 - 30.0 mg/dL  CBC     Status: Abnormal   Collection Time: 01/04/15  7:47 AM  Result Value Ref Range   WBC 12.7 (H) 3.8 - 10.6 K/uL   RBC 4.44 4.40 - 5.90 MIL/uL   Hemoglobin 15.0 13.0 - 18.0 g/dL   HCT 43.4 40.0 - 52.0 %   MCV 97.8 80.0 - 100.0 fL   MCH 33.7 26.0 - 34.0 pg   MCHC 34.5 32.0 - 36.0 g/dL   RDW 14.7 (H) 11.5 - 14.5 %   Platelets 211 150 - 440 K/uL  Comprehensive metabolic panel     Status: Abnormal   Collection Time: 01/04/15  7:47 AM  Result Value Ref Range   Sodium 136 135 - 145 mmol/L   Potassium 3.4 (L) 3.5 - 5.1 mmol/L   Chloride 96 (L) 101 - 111 mmol/L   CO2 17 (L) 22 - 32 mmol/L   Glucose, Bld 97 65 - 99 mg/dL   BUN 5 (L) 6 - 20 mg/dL   Creatinine, Ser 0.92 0.61 - 1.24 mg/dL   Calcium 9.5 8.9 - 10.3 mg/dL   Total Protein 8.2 (H) 6.5 - 8.1 g/dL   Albumin 4.4 3.5 - 5.0 g/dL   AST 1064 (H) 15 - 41 U/L   ALT  127 (H) 17 - 63 U/L   Alkaline Phosphatase 173 (H) 38 - 126 U/L   Total Bilirubin 1.0 0.3 - 1.2 mg/dL   GFR calc non Af Amer >60 >60 mL/min   GFR calc Af Amer >60 >60 mL/min    Comment: (NOTE) The eGFR has been calculated using the CKD EPI equation.  This calculation has not been validated in all clinical situations. eGFR's persistently <60 mL/min signify possible Chronic Kidney Disease.    Anion gap 23 (H) 5 - 15  Magnesium     Status: None   Collection Time: 01/04/15  7:47 AM  Result Value Ref Range   Magnesium 1.9 1.7 - 2.4 mg/dL  Glucose, capillary     Status: None   Collection Time: 01/04/15  8:05 AM  Result Value Ref Range   Glucose-Capillary 94 65 - 99 mg/dL  Urine Drug Screen, Qualitative ([facility] only)     Status: Abnormal   Collection Time: 01/04/15  8:51 AM  Result Value Ref Range   Tricyclic, Ur Screen POSITIVE (A) NONE DETECTED   Amphetamines, Ur Screen POSITIVE (A) NONE DETECTED   MDMA (Ecstasy)Ur Screen NONE DETECTED NONE DETECTED   Cocaine Metabolite,Ur [place] NONE DETECTED NONE DETECTED   Opiate, Ur Screen NONE DETECTED NONE DETECTED   Phencyclidine (PCP) Ur S NONE DETECTED NONE DETECTED   Cannabinoid 50 Ng, Ur [place] POSITIVE (A) NONE DETECTED   Barbiturates, Ur Screen NONE DETECTED NONE DETECTED   Benzodiazepine, Ur Scrn POSITIVE (A) NONE DETECTED   Methadone Scn, Ur POSITIVE (A) NONE DETECTED    Comment: (NOTE) 893  Tricyclics, urine               Cutoff 1000 ng/mL 200  Amphetamines, urine             Cutoff 1000 ng/mL 300  MDMA (Ecstasy), urine           Cutoff 500 ng/mL 400  Cocaine Metabolite, urine       Cutoff 300 ng/mL 500  Opiate, urine                   Cutoff 300 ng/mL 600  Phencyclidine (PCP), urine      Cutoff 25 ng/mL 700  Cannabinoid, urine              Cutoff 50 ng/mL 800  Barbiturates, urine             Cutoff 200 ng/mL 900  Benzodiazepine, urine           Cutoff 200 ng/mL 1000 Methadone, urine                Cutoff 300 ng/mL 1100 1200 The urine  drug screen provides only a preliminary, unconfirmed 1300 analytical test result and should not be used for non-medical 1400 purposes. Clinical consideration and professional judgment should 1500 be applied to any positive drug screen result due to possible 1600 interfering substances. A more specific alternate chemical method 1700 must be used in order to obtain a confirmed analytical result.  1800 Gas chromato graphy / mass spectrometry (GC/MS) is the preferred 1900 confirmatory method.   Protime-INR     Status: None   Collection Time: 01/04/15 11:54 AM  Result Value Ref Range   Prothrombin Time 13.3 11.4 - 15.0 seconds   INR 0.99   Lactic acid, plasma     Status: None   Collection Time: 01/04/15 11:54 AM  Result Value Ref Range   Lactic Acid, Venous 0.8 0.5 - 2.0 mmol/L  Phosphorus     Status: None   Collection Time: 01/04/15 11:54 AM  Result Value Ref Range   Phosphorus 2.7 2.5 - 4.6 mg/dL  Lipase, blood     Status: Abnormal   Collection Time: 01/04/15 11:54 AM  Result Value Ref Range   Lipase 68 (H) 22 -  51 U/L  Troponin I (q 6hr x 3)     Status: None   Collection Time: 01/04/15 11:54 AM  Result Value Ref Range   Troponin I <0.03 <0.031 ng/mL    Comment:        NO INDICATION OF MYOCARDIAL INJURY.     Vitals: Blood pressure 175/115, pulse 120, temperature 98.7 F (37.1 C), temperature source Oral, resp. rate 14, height 6' (1.829 m), weight 79.4 kg (175 lb 0.7 oz), SpO2 98 %.  Risk to Self: Is patient at risk for suicide?: No Risk to Others:   Prior Inpatient Therapy:   Prior Outpatient Therapy:    Current Facility-Administered Medications  Medication Dose Route Frequency Provider Last Rate Last Dose  . acetaminophen (TYLENOL) tablet 650 mg  650 mg Oral Q6H PRN Aldean Jewett, MD       Or  . acetaminophen (TYLENOL) suppository 650 mg  650 mg Rectal Q6H PRN Aldean Jewett, MD      . dexmedetomidine (PRECEDEX) 400 MCG/100ML (4 mcg/mL) infusion  0.2-0.7  mcg/kg/hr Intravenous Continuous Aldean Jewett, MD 9.9 mL/hr at 01/04/15 1151 0.5 mcg/kg/hr at 01/04/15 1151  . folic acid (FOLVITE) tablet 1 mg  1 mg Oral Daily Aldean Jewett, MD   1 mg at 01/04/15 1314  . LORazepam (ATIVAN) 2 MG/ML injection           . LORazepam (ATIVAN) injection 2-3 mg  2-3 mg Intravenous Q1H PRN Aldean Jewett, MD      . magnesium hydroxide (MILK OF MAGNESIA) suspension 30 mL  30 mL Oral Daily PRN Aldean Jewett, MD      . nitroGLYCERIN (NITROGLYN) 2 % ointment 0.5 inch  0.5 inch Topical 4 times per day Aldean Jewett, MD   0.5 inch at 01/04/15 1315  . ondansetron (ZOFRAN) tablet 4 mg  4 mg Oral Q6H PRN Aldean Jewett, MD       Or  . ondansetron [facility]) injection 4 mg  4 mg Intravenous Q6H PRN Aldean Jewett, MD      . thiamine (VITAMIN B-1) tablet 100 mg  100 mg Oral Daily Aldean Jewett, MD   100 mg at 01/04/15 1314    Musculoskeletal: Strength & Muscle Tone: within normal limits Gait & Station: not tested. Patient leans: not tested  Psychiatric Specialty Exam: Physical Exam  Nursing note and vitals reviewed.   Review of Systems  Gastrointestinal: Positive for nausea and vomiting.  All other systems reviewed and are negative.   Blood pressure 175/115, pulse 120, temperature 98.7 F (37.1 C), temperature source Oral, resp. rate 14, height 6' (1.829 m), weight 79.4 kg (175 lb 0.7 oz), SpO2 98 %.Body mass index is 23.74 kg/(m^2).  General Appearance: Casual  Eye Contact::  Minimal  Speech:  Slow  Volume:  Decreased  Mood:  Anxious  Affect:  Flat  Thought Process:  Goal Directed  Orientation:  Full (Time, Place, and Person)  Thought Content:  WDL  Suicidal Thoughts:  No  Homicidal Thoughts:  No  Memory:  Immediate;   Fair Recent;   Poor Remote;   Fair  Judgement:  Poor  Insight:  Lacking  Psychomotor Activity:  Decreased  Concentration:  Poor  Recall:  Poor  Fund of Knowledge:Fair  Language: Fair  Akathisia:  No  Handed:   Right  AIMS (if indicated):     Assets:  Communication Skills Desire for Improvement Housing Social Support  ADL's:  Intact  Cognition:  WNL  Sleep:      Medical Decision Making: New problem, with additional work up planned, Review of Psycho-Social Stressors (1), Review or order clinical lab tests (1), Review of Medication Regimen & Side Effects (2) and Review of New Medication or Change in Dosage (2)  Treatment Plan Summary: Medication management   1. Please continue alcohol detox.  2. The patient is not interested in psychopharmacology.  3. He made arrangments already to enter alcohol treatment program o his choice and does not need our help.  4. I will sign off.  Plan:  No evidence of imminent risk to self or others at present.   Patient does not meet criteria for psychiatric inpatient admission. Supportive therapy provided about ongoing stressors. Discussed crisis plan, support from social network, calling 911, coming to the Emergency Department, and calling Suicide Hotline.  Disposition: Please discharge as appropriate.  Yordan [place] 01/04/2015 2:26 PM

## 2015-01-05 ENCOUNTER — Inpatient Hospital Stay: Payer: Self-pay

## 2015-01-05 DIAGNOSIS — K7010 Alcoholic hepatitis without ascites: Secondary | ICD-10-CM

## 2015-01-05 LAB — PROTIME-INR
INR: 1.02
PROTHROMBIN TIME: 13.6 s (ref 11.4–15.0)

## 2015-01-05 LAB — COMPREHENSIVE METABOLIC PANEL
ALT: 161 U/L — AB (ref 17–63)
AST: 849 U/L — ABNORMAL HIGH (ref 15–41)
Albumin: 3.5 g/dL (ref 3.5–5.0)
Alkaline Phosphatase: 182 U/L — ABNORMAL HIGH (ref 38–126)
Anion gap: 10 (ref 5–15)
BUN: <5 mg/dL — ABNORMAL LOW (ref 6–20)
CO2: 25 mmol/L (ref 22–32)
Calcium: 8.7 mg/dL — ABNORMAL LOW (ref 8.9–10.3)
Chloride: 99 mmol/L — ABNORMAL LOW (ref 101–111)
Creatinine, Ser: 0.66 mg/dL (ref 0.61–1.24)
GFR calc non Af Amer: >60 mL/min (ref >60)
GLUCOSE: 89 mg/dL (ref 65–99)
POTASSIUM: 3.2 mmol/L — AB (ref 3.5–5.1)
SODIUM: 134 mmol/L — AB (ref 135–145)
TOTAL PROTEIN: 7 g/dL (ref 6.5–8.1)
Total Bilirubin: 1.2 mg/dL (ref 0.3–1.2)

## 2015-01-05 LAB — CBC
HEMATOCRIT: 36.4 % — AB (ref 40.0–52.0)
HEMOGLOBIN: 12.6 g/dL — AB (ref 13.0–18.0)
MCH: 33 pg (ref 26.0–34.0)
MCHC: 34.5 g/dL (ref 32.0–36.0)
MCV: 95.5 fL (ref 80.0–100.0)
Platelets: 125 10*3/uL — ABNORMAL LOW (ref 150–440)
RBC: 3.81 MIL/uL — ABNORMAL LOW (ref 4.40–5.90)
RDW: 14.5 % (ref 11.5–14.5)
WBC: 14 10*3/uL — ABNORMAL HIGH (ref 3.8–10.6)

## 2015-01-05 LAB — HEPATITIS PANEL, ACUTE
HCV AB: 0.1 {s_co_ratio} (ref 0.0–0.9)
HEP A IGM: NEGATIVE
HEP B S AG: NEGATIVE
Hep B C IgM: NEGATIVE

## 2015-01-05 LAB — TROPONIN I

## 2015-01-05 LAB — LIPASE, BLOOD: LIPASE: 34 U/L (ref 22–51)

## 2015-01-05 MED ORDER — HYDRALAZINE HCL 20 MG/ML IJ SOLN: 10.0000 mg | Freq: Four times a day (QID) | INTRAMUSCULAR | Status: DC | PRN

## 2015-01-05 MED ORDER — POTASSIUM CHLORIDE CRYS ER 20 MEQ PO TBCR
40.0000 meq | EXTENDED_RELEASE_TABLET | Freq: Once | ORAL | Status: AC
  Administered 2015-01-05: 40 meq via ORAL
  Filled 2015-01-05: qty 2

## 2015-01-05 NOTE — Progress Notes (Signed)
In to assess patient and he is asleep. Tremors noted. When he did wake up he kept asking me what is that noise. He then got up and started walking around the bed. His heart rate up to 150s. PRN lorazepam given.

## 2015-01-05 NOTE — Progress Notes (Signed)
Sling applied to patient's left arm. He keeps touching his tongue and I educated him not to touch area since he has sutures in which already predisposes him to an infection and his dirty hands increases that risk.

## 2015-01-05 NOTE — Progress Notes (Signed)
Subjective: Pt denies abdominal pain, nausea or vomiting.  Denies pruritis or dark urine.  Objective: Vital signs in last 24 hours: Temp:  [99.1 F (37.3 C)-100.1 F (37.8 C)] 100.1 F (37.8 C) (07/07 0700) Pulse Rate:  [88-120] 104 (07/07 1000) Resp:  [5-23] 20 (07/07 1000) BP: (132-175)/(90-122) 142/90 mmHg (07/07 1000) SpO2:  [95 %-100 %] 100 % (07/07 1000) Last BM Date: 01/05/15 No LMP for male patient. Body mass index is 23.74 kg/(m^2). General:   Alert,  Well-developed, well-nourished, pleasant and cooperative in NAD Head:  Normocephalic and atraumatic. Eyes:  Sclera clear, no icterus.   Conjunctiva pink. Mouth:  No deformity or lesions, oropharynx pink & moist with exception of right lateral tongue repair. Neck:  Supple; no masses or thyromegaly. Heart:  Regular rate and rhythm; no murmurs, clicks, rubs, or gallops. Abdomen:   Normal bowel sounds.  Soft, nontender and nondistended. No masses, hepatosplenomegaly or hernias noted.  No guarding or rebound tenderness.   Msk:  Symmetrical without gross deformities. Good equal movement & strength bilaterally. Pulses:  Normal pulses noted. Extremities:  Without clubbing or edema.  No cyanosis Neurologic:  Alert and  oriented x3;  grossly normal neurologically. Skin:  Intact without significant lesions or rashes. Cervical Nodes:  No significant cervical adenopathy. Psych:  Alert and cooperative. Normal mood and affect.  Intake/Output from previous day: 07/06 0701 - 07/07 0700 In: 1158.8 [P.O.:240; I.V.:918.8] Out: 1504 [Urine:1502; Stool:2]  Lab Results:  Recent Labs  01/04/15 0747 01/05/15 0655  WBC 12.7* 14.0*  HGB 15.0 12.6*  HCT 43.4 36.4*  PLT 211 125*   BMET  Recent Labs  01/04/15 0747 01/04/15 1416 01/05/15 0655  NA 136 133* 134*  K 3.4* 3.0* 3.2*  CL 96* 98* 99*  CO2 17* 23 25  GLUCOSE 97 74 89  BUN 5* 5* <5*  CREATININE 0.92 0.75 0.66  CALCIUM 9.5 7.9* 8.7*   LFT  Recent Labs  01/04/15 0747  01/04/15 1154 01/05/15 0655  PROT 8.2*  --  7.0  ALBUMIN 4.4  --  3.5  AST 1064*  --  849*  ALT 127*  --  161*  ALKPHOS 173*  --  182*  BILITOT 1.0  --  1.2  LIPASE  --  68* 34   PT/INR  Recent Labs  01/04/15 1154 01/05/15 0655  LABPROT 13.3 13.6  INR 0.99 1.02   Hepatitis Panel  Recent Labs  01/04/15 1825  HEPBSAG Negative  HCVAB 0.1  HEPAIGM Negative  HEPBIGM Negative   Studies/Results: [name]  01/05/2015   CLINICAL DATA:  Elevated liver function tests.  EXAM: ULTRASOUND ABDOMEN COMPLETE  COMPARISON:  10/02/2014  FINDINGS: Gallbladder: No gallstones or wall thickening visualized. No sonographic Murphy sign noted.  Common bile duct: Diameter: 3 mm, within normal limits.  Liver: Mildly increased echogenicity of the hepatic parenchyma, consistent with diffuse hepatic steatosis/hepatocellular disease. No focal mass lesion identified.  IVC: No abnormality visualized.  Pancreas: Not visualized due to overlying bowel gas.  Spleen: Size and appearance within normal limits.  Right Kidney: Length: 11.2 cm. Echogenicity within normal limits. No mass or hydronephrosis visualized.  Left Kidney: Length: 12.0 cm. Echogenicity within normal limits. No mass or hydronephrosis visualized.  Abdominal aorta: No aneurysm visualized, although incompletely visualized due to overlying bowel gas.  Other findings: None.  IMPRESSION: No evidence of cholelithiasis or biliary ductal dilatation.  Stable mild diffuse hepatic steatosis/hepatocellular disease.   Electronically Signed   By: Alver SorrowJohn  Stahl M.D.  On: 01/05/2015 09:04   Dg Shoulder Left  01/04/2015   CLINICAL DATA:  Status post seizure with a fall today. Left shoulder pain. Initial encounter.  EXAM: LEFT SHOULDER - 2+ VIEW  COMPARISON:  None.  FINDINGS: The humerus is located and the acromioclavicular joint is intact. Small calcifications are seen along the inferior and medial aspect of the humeral neck. Imaged left lung and ribs appear  normal.  IMPRESSION: Small ossific fragments projecting along the medial aspect of the humeral neck cannot be definitively characterized. The could be due to a remote trauma or may represent fragments from acute fracture. CT or MRI could be used for further evaluation.   Electronically Signed   By: Drusilla Kanner M.D.   On: 01/04/2015 13:48    Assessment: Acute hepatitis:  Likely ETOH & drug induced.  AST improving.  Viral hepatitis negative.  Hepatic steatosis on ultrasound.  Plan: #1 continue supportive measures #2 follow LFTS to baseline #3 avoid ETOH/illicit drugs  The care of Johnathan Rivera will be discussed in direct collaboration with Dr Midge Minium, Attending Gastroenterologist.  LOS: 1 day  Lorenza Burton  01/05/2015, 11:56 AM [facility]  [address] [place], [place] [address] Phone: [phone] Fax : [phone]

## 2015-01-05 NOTE — Progress Notes (Signed)
Precedex drip has been off since 935 a.m. He is restless at times and needs redirection. He is cooperative and receptive to care and agrees to stay in the hospital until MD discharges.

## 2015-01-05 NOTE — Progress Notes (Signed)
[facility] [place] [facility]                                                                                                                                                                                            Patient Demographics   Johnathan Rivera, is a 29 y.o. male, DOB - 24-Feb-1986, ZOX:[id number]  Admit date - 01/04/2015   Admitting Physician Gale Journeyatherine P Walsh, MD  Outpatient Primary MD for the patient is Evelene CroonNIEMEYER, MEINDERT, MD   LOS - 1  Subjective: Patient was agitated overnight and was on Precedex this morning is more awake interested in possibly going home. He did have low but fevers last night     Review of Systems:   CONSTITUTIONAL: Low-grade fever. No fatigue, weakness. No weight gain, no weight loss.  EYES: No blurry or double vision.  ENT: No tinnitus. No postnasal drip. No redness of the oropharynx.  RESPIRATORY: No cough, no wheeze, no hemoptysis. No dyspnea.  CARDIOVASCULAR: No chest pain. No orthopnea. No palpitations. No syncope.  GASTROINTESTINAL: No nausea, no vomiting or diarrhea. No abdominal pain. No melena or hematochezia.  GENITOURINARY: No dysuria or hematuria.  ENDOCRINE: No polyuria or nocturia. No heat or cold intolerance.  HEMATOLOGY: No anemia. No bruising. No bleeding.  INTEGUMENTARY: No rashes. No lesions.  MUSCULOSKELETAL: No arthritis. No swelling. No gout.  NEUROLOGIC: No numbness, tingling, or ataxia. No seizure-type activity.  PSYCHIATRIC: No anxiety. No insomnia. No ADD.    Vitals:   Filed Vitals:   01/05/15 0700 01/05/15 0800 01/05/15 0900 01/05/15 1000  BP: 147/110 134/101 154/101 142/90  Pulse: 91 95 96 104  Temp: 100.1 F (37.8 C)     TempSrc: Oral     Resp: 16 16 16 20   Height:      Weight:      SpO2: 95% 98% 99% 100%    Wt Readings from Last 3 Encounters:  01/04/15 79.4 kg (175 lb 0.7 oz)     Intake/Output Summary (Last 24 hours) at 01/05/15 1206 Last data filed at 01/05/15 0900   Gross per 24 hour  Intake 1177.07 ml  Output   1504 ml  Net -326.93 ml    Physical Exam:   GENERAL: Well-developed male in no acute distress  HEAD, EYES, EARS, NOSE AND THROAT: Atraumatic, normocephalic. Extraocular muscles are intact. Pupils equal and reactive to light. Sclerae anicteric. No conjunctival injection. No oro-pharyngeal erythema.  NECK: Supple. There is no jugular venous distention. No bruits, no lymphadenopathy, no thyromegaly.  HEART: Regular rate and rhythm, tachycardic. No murmurs, no rubs, no clicks.  LUNGS: Clear to auscultation bilaterally.  No rales or rhonchi. No wheezes.  ABDOMEN: Soft, flat, nontender, nondistended. Has good bowel sounds. No hepatosplenomegaly appreciated.  EXTREMITIES: No evidence of any cyanosis, clubbing, or peripheral edema.  +2 pedal and radial pulses bilaterally.  NEUROLOGIC: The patient is alert, awake, and oriented x3 with no focal motor or sensory deficits appreciated bilaterally.  SKIN: Moist and warm with no rashes appreciated.  Psych: Not anxious, depressed LN: No inguinal LN enlargement    Antibiotics   Anti-infectives    Start     Dose/Rate Route Frequency Ordered Stop   01/04/15 2200  amoxicillin-clavulanate (AUGMENTIN) 875-125 MG per tablet 1 tablet     1 tablet Oral Every 12 hours 01/04/15 1829 01/14/15 2159      Medications   Scheduled Meds: . amoxicillin-clavulanate  1 tablet Oral Q12H  . folic acid  1 mg Oral Daily  . nitroGLYCERIN  0.5 inch Topical 4 times per day  . thiamine  100 mg Oral Daily   Continuous Infusions:  PRN Meds:.acetaminophen **OR** acetaminophen, hydrALAZINE, LORazepam, magnesium hydroxide, ondansetron **OR** ondansetron (ZOFRAN) IV   Data Review:   Micro Results Recent Results (from the past 240 hour(s))  MRSA PCR Screening     Status: None   Collection Time: 01/04/15  7:45 AM  Result Value Ref Range Status   MRSA by PCR NEGATIVE NEGATIVE Final    Comment:        The GeneXpert MRSA  Assay (FDA approved for NASAL specimens only), is one component of a comprehensive MRSA colonization surveillance program. It is not intended to diagnose MRSA infection nor to guide or monitor treatment for MRSA infections.     Radiology Reports US Abdomen Complete  01/05/2015   CLINICAL DATA:  Elevated liver function tests.  EXAM: ULTRASOUND ABDOMEN COMPLETE  COMPARISON:  10/02/2014  FINDINGS: Gallbladder: No gallstones or wall thickening visualized. No sonographic Murphy sign noted.  Common bile duct: Diameter: 3 mm, within normal limits.  Liver: Mildly increased echogenicity of the hepatic parenchyma, consistent with diffuse hepatic steatosis/hepatocellular disease. No focal mass lesion identified.  IVC: No abnormality visualized.  Pancreas: Not visualized due to overlying bowel gas.  Spleen: Size and appearance within normal limits.  Right Kidney: Length: 11.2 cm. Echogenicity within normal limits. No mass or hydronephrosis visualized.  Left Kidney: Length: 12.0 cm. Echogenicity within normal limits. No mass or hydronephrosis visualized.  Abdominal aorta: No aneurysm visualized, although incompletely visualized due to overlying bowel gas.  Other findings: None.  IMPRESSION: No evidence of cholelithiasis or biliary ductal dilatation.  Stable mild diffuse hepatic steatosis/hepatocellular disease.   Electronically Signed   By: Myles Rosenthal M.D.   On: 01/05/2015 09:04   Dg Shoulder Left  01/04/2015   CLINICAL DATA:  Status post seizure with a fall today. Left shoulder pain. Initial encounter.  EXAM: LEFT SHOULDER - 2+ VIEW  COMPARISON:  None.  FINDINGS: The humerus is located and the acromioclavicular joint is intact. Small calcifications are seen along the inferior and medial aspect of the humeral neck. Imaged left lung and ribs appear normal.  IMPRESSION: Small ossific fragments projecting along the medial aspect of the humeral neck cannot be definitively characterized. The could be due to a remote  trauma or may represent fragments from acute fracture. CT or MRI could be used for further evaluation.   Electronically Signed   By: Drusilla Kanner M.D.   On: 01/04/2015 13:48     CBC  Recent Labs Lab 01/04/15 0747 01/05/15 0655  WBC 12.7*  14.0*  HGB 15.0 12.6*  HCT 43.4 36.4*  PLT 211 125*  MCV 97.8 95.5  MCH 33.7 33.0  MCHC 34.5 34.5  RDW 14.7* 14.5    Chemistries   Recent Labs Lab 01/04/15 0747 01/04/15 1416 01/05/15 0655  NA 136 133* 134*  K 3.4* 3.0* 3.2*  CL 96* 98* 99*  CO2 17* 23 25  GLUCOSE 97 74 89  BUN 5* 5* <5*  CREATININE 0.92 0.75 0.66  CALCIUM 9.5 7.9* 8.7*  MG 1.9  --   --   AST 1064*  --  849*  ALT 127*  --  161*  ALKPHOS 173*  --  182*  BILITOT 1.0  --  1.2   ------------------------------------------------------------------------------------------------------------------ estimated creatinine clearance is 146.8 mL/min (by C-G formula based on Cr of 0.66). ------------------------------------------------------------------------------------------------------------------ No results for input(s): HGBA1C in the last 72 hours. ------------------------------------------------------------------------------------------------------------------ No results for input(s): CHOL, HDL, LDLCALC, TRIG, CHOLHDL, LDLDIRECT in the last 72 hours. ------------------------------------------------------------------------------------------------------------------ No results for input(s): TSH, T4TOTAL, T3FREE, THYROIDAB in the last 72 hours.  Invalid input(s): FREET3 ------------------------------------------------------------------------------------------------------------------ No results for input(s): VITAMINB12, FOLATE, FERRITIN, TIBC, IRON, RETICCTPCT in the last 72 hours.  Coagulation profile  Recent Labs Lab 01/04/15 1154 01/05/15 0655  INR 0.99 1.02    No results for input(s): DDIMER in the last 72 hours.  Cardiac Enzymes  Recent Labs Lab  01/04/15 1154 01/04/15 1825 01/05/15 0057  TROPONINI <0.03 <0.03 <0.03   ------------------------------------------------------------------------------------------------------------------ Invalid input(s): POCBNP    Assessment & Plan   Problem #1 seizure due to alcohol withdrawal: Continue to monitor off antiseizure medications.  #2 alcohol abuse with current withdrawal: Siva protocol.  #3 generalized anxiety disorder: Psych input appreciated  #4 alcoholic hepatitis: Transaminitis in typical pattern with greatly elevated AST indicative of alcoholic hepatitis.    #5 hypokalemia: Give him another dose of potassium   #6 anion gap metabolic acidosis: Likely due to alcohol and possible lactic acidosis after seizure activity. Resolved prophylaxis: SCDs for   #7. Sprain left shoulder with chip fx. ice and shoulder immobilizer recommended  #8. Tongue laceration status post suturing by ENT  DVT prophylaxis. Hold pharmacological DVT prophylaxis due to current bleeding in the mouth and high risk of seizure with injury. Protonix for GI prophylaxis Likely discharge tomorrow     Code Status Orders        Start     Ordered   01/04/15 1253  Full code   Continuous     01/04/15 1252           Consults  psychiatry, orthopedics DVT Prophylaxis SCDs  Lab Results  Component Value Date   PLT 125* 01/05/2015     Time Spent in minutes  45 minutes  Greater than 50% of time spent in care coordination and counseling.   Auburn Bilberry M.D on 01/05/2015 at 12:06 PM  Between 7am to 6pm - Pager - [phone]  After 6pm go to [email or web address] - password [email or web address] [facility]  [facility]   Office  [phone]

## 2015-01-05 NOTE — Progress Notes (Signed)
Pt slept most of shift, no signs of detoxification noted during this shift. Pt aao x 4. md aware of b/p.

## 2015-01-05 NOTE — Progress Notes (Signed)
Patient in bed eating lunch. No tremors noted. His mother is at his bedside. PRN lorazepam is effective.

## 2015-01-06 MED ORDER — ATENOLOL 50 MG PO TABS: 50.0000 mg | ORAL_TABLET | Freq: Every day | ORAL | Status: DC

## 2015-01-06 MED ORDER — AMOXICILLIN-POT CLAVULANATE 875-125 MG PO TABS: 1.0000 | ORAL_TABLET | Freq: Two times a day (BID) | ORAL | Status: AC

## 2015-01-06 MED ORDER — ATENOLOL 50 MG PO TABS
50.0000 mg | ORAL_TABLET | Freq: Every day | ORAL | Status: DC
  Administered 2015-01-06: 50 mg via ORAL
  Filled 2015-01-06: qty 1

## 2015-01-06 MED ORDER — CLONAZEPAM 2 MG PO TABS: 2.0000 mg | ORAL_TABLET | Freq: Two times a day (BID) | ORAL | Status: DC

## 2015-01-06 MED ORDER — ACETAMINOPHEN 325 MG PO TABS: 650.0000 mg | ORAL_TABLET | Freq: Four times a day (QID) | ORAL | Status: DC | PRN

## 2015-01-06 NOTE — Discharge Summary (Signed)
Johnathan Rivera, 29 y.o., DOB Sep 18, 1985, MRN [id number]. Admission date: 01/04/2015 Discharge Date 01/06/2015 Primary MD Evelene CroonNIEMEYER, MEINDERT, MD Admitting Physician Gale Journeyatherine P Walsh, MD  Admission Diagnosis  Seizure [R56.9] Tachycardia [R00.0] Tongue laceration, initial encounter [S01.512A]  Discharge Diagnosis   Principal Problem:   Alcohol dependence Active Problems:   Alcohol abuse   Alcohol withdrawal seizure   Anxiety   Alcoholic hepatitis   Elevated LFTs   Polysubstance abuse Left shourder sprain with chip fx Hypokalemia Anion gap metabolic acidosis        Hospital Course Johnathan Rivera is a 29 y.o. male with a known history of alcohol abuse and alcohol withdrawal seizures presents today after seizure this morning. He reported that he has been trying to cut down on his alcohol intake. Current average alcohol intake is one bottle of wine daily, this is down from prior consumption. He has not had a drink in about 24 hours prior to admission. The day of admission morning he woke up and was speaking to his mother when he stopped speaking and his body became very tight, he began shaking slid slowly to his side and then onto the floor where he laid until EMS arrived about 5 minutes later. He remembers his mother yelling for him to wake up and EMS arriving. He was not confused upon waking, no neurologic changes. Patient was admitted to the hospital for alcohol withdrawal seizures. There was some concern that he was detoxing therefore am he was started on a Precedex drip and a Sever protocol. Patient also had a laceration on his tongue wrist was sutured I Dr. Geanie LoganPaul Bennett. He recommended soft diet and antabiotic's to prevent infection. Patient also was seen by psychiatry who told the psychiatrist that he plans to do outpatient detox and has ordered a set that up. Patient was also having pain on the left shoulder he had x-ray which showed a chip fracture with left shoulder sprain he  was seen by Dr. Hyacinth MeekerMiller orthopedics who recommended ice and a brace. He'll follow up with him as outpatient. Patient has no further evidence of seizure and is not having any evidence of detoxifications. He is stable for discharge           Consults  orthopedic surgery, psychiatry  Significant Tests:  See full reports for all details    [name]  01/05/2015   CLINICAL DATA:  Elevated liver function tests.  EXAM: ULTRASOUND ABDOMEN COMPLETE  COMPARISON:  10/02/2014  FINDINGS: Gallbladder: No gallstones or wall thickening visualized. No sonographic Murphy sign noted.  Common bile duct: Diameter: 3 mm, within normal limits.  Liver: Mildly increased echogenicity of the hepatic parenchyma, consistent with diffuse hepatic steatosis/hepatocellular disease. No focal mass lesion identified.  IVC: No abnormality visualized.  Pancreas: Not visualized due to overlying bowel gas.  Spleen: Size and appearance within normal limits.  Right Kidney: Length: 11.2 cm. Echogenicity within normal limits. No mass or hydronephrosis visualized.  Left Kidney: Length: 12.0 cm. Echogenicity within normal limits. No mass or hydronephrosis visualized.  Abdominal aorta: No aneurysm visualized, although incompletely visualized due to overlying bowel gas.  Other findings: None.  IMPRESSION: No evidence of cholelithiasis or biliary ductal dilatation.  Stable mild diffuse hepatic steatosis/hepatocellular disease.   Electronically Signed   By: Myles RosenthalJohn  Stahl M.D.   On: 01/05/2015 09:04   Dg Shoulder Left  01/04/2015   CLINICAL DATA:  Status post seizure with a fall today. Left shoulder pain. Initial encounter.  EXAM: LEFT SHOULDER -  2+ VIEW  COMPARISON:  None.  FINDINGS: The humerus is located and the acromioclavicular joint is intact. Small calcifications are seen along the inferior and medial aspect of the humeral neck. Imaged left lung and ribs appear normal.  IMPRESSION: Small ossific fragments projecting along the medial  aspect of the humeral neck cannot be definitively characterized. The could be due to a remote trauma or may represent fragments from acute fracture. CT or MRI could be used for further evaluation.   Electronically Signed   By: Drusilla Kanner M.D.   On: 01/04/2015 13:48       Today   Subjective:   Johnathan Rivera feels better denies any complaints feels little anxious  Objective:   Blood pressure 154/104, pulse 104, temperature 97.5 F (36.4 C), temperature source Axillary, resp. rate 14, height 6' (1.829 m), weight 79.4 kg (175 lb 0.7 oz), SpO2 97 %.  .  Intake/Output Summary (Last 24 hours) at 01/06/15 1200 Last data filed at 01/05/15 2100  Gross per 24 hour  Intake      0 ml  Output    450 ml  Net   -450 ml    Exam VITAL SIGNS: Blood pressure 154/104, pulse 104, temperature 97.5 F (36.4 C), temperature source Axillary, resp. rate 14, height 6' (1.829 m), weight 79.4 kg (175 lb 0.7 oz), SpO2 97 %.  GENERAL:  29 y.o.-year-old patient lying in the bed with no acute distress.  EYES: Pupils equal, round, reactive to light and accommodation. No scleral icterus. Extraocular muscles intact.  HEENT: Head atraumatic, normocephalic. Oropharynx and nasopharynx clear.  NECK:  Supple, no jugular venous distention. No thyroid enlargement, no tenderness.  LUNGS: Normal breath sounds bilaterally, no wheezing, rales,rhonchi or crepitation. No use of accessory muscles of respiration.  CARDIOVASCULAR: S1, S2 normal. No murmurs, rubs, or gallops.  ABDOMEN: Soft, nontender, nondistended. Bowel sounds present. No organomegaly or mass.  EXTREMITIES: No pedal edema, cyanosis, or clubbing.  NEUROLOGIC: Cranial nerves II through XII are intact. Muscle strength 5/5 in all extremities. Sensation intact. Gait not checked.  PSYCHIATRIC: The patient is alert and oriented x 3.  SKIN: No obvious rash, lesion, or ulcer.   Data Review     CBC w Diff: Lab Results  Component Value Date   WBC 14.0*  01/05/2015   WBC 14.8* 10/03/2014   HGB 12.6* 01/05/2015   HGB 12.3* 10/03/2014   HCT 36.4* 01/05/2015   HCT 36.6* 10/03/2014   PLT 125* 01/05/2015   PLT 114* 10/03/2014   LYMPHOPCT 9.5 10/03/2014   MONOPCT 5.4 10/03/2014   EOSPCT 1.4 10/03/2014   BASOPCT 0.2 10/03/2014   CMP: Lab Results  Component Value Date   NA 134* 01/05/2015   NA 138 10/03/2014   K 3.2* 01/05/2015   K 2.8* 10/03/2014   CL 99* 01/05/2015   CL 101 10/03/2014   CO2 25 01/05/2015   CO2 27 10/03/2014   BUN <5* 01/05/2015   BUN <5 10/03/2014   CREATININE 0.66 01/05/2015   CREATININE 0.64 10/03/2014   PROT 7.0 01/05/2015   PROT 7.9 10/01/2014   ALBUMIN 3.5 01/05/2015   ALBUMIN 4.0 10/01/2014   BILITOT 1.2 01/05/2015   ALKPHOS 182* 01/05/2015   ALKPHOS 159* 10/01/2014   AST 849* 01/05/2015   AST 172* 10/01/2014   ALT 161* 01/05/2015   ALT 60 10/01/2014  .  Micro Results Recent Results (from the past 240 hour(s))  MRSA PCR Screening     Status: None   Collection Time:  01/04/15  7:45 AM  Result Value Ref Range Status   MRSA by PCR NEGATIVE NEGATIVE Final    Comment:        The GeneXpert MRSA Assay (FDA approved for NASAL specimens only), is one component of a comprehensive MRSA colonization surveillance program. It is not intended to diagnose MRSA infection nor to guide or monitor treatment for MRSA infections.         Code Status Orders        Start     Ordered   01/04/15 1253  Full code   Continuous     01/04/15 1252          Follow-up Information    Follow up In 7 days.      Follow up with Evelene Croon, MD.   Specialty:  Family Medicine   Contact information:   [facility] [place] [address] [phone]       Follow up with dr. .      Follow up with Valinda Hoar, MD In 10 days.   Specialty:  Specialist   Contact information:   [address] [place] [address] [phone]       Discharge Medications     Medication List     TAKE these medications        acetaminophen 325 MG tablet  Commonly known as:  TYLENOL  Take 2 tablets (650 mg total) by mouth every 6 (six) hours as needed for mild pain (or Fever >/= 101).     amoxicillin-clavulanate 875-125 MG per tablet  Commonly known as:  AUGMENTIN  Take 1 tablet by mouth every 12 (twelve) hours.     atenolol 50 MG tablet  Commonly known as:  TENORMIN  Take 1 tablet (50 mg total) by mouth daily.     clonazePAM 2 MG tablet  Commonly known as:  KLONOPIN  Take 1 tablet (2 mg total) by mouth 2 (two) times daily.           Total Time in preparing paper work, data evaluation and todays exam - 35 minutes  Auburn Bilberry M.D on 01/06/2015 at 12:00 PM  [facility]   Office  [phone]

## 2015-01-06 NOTE — Progress Notes (Signed)
Discharge instructions given with verbalized understanding from patient.  VSS.  Patient denies pain at this time.  Patient taken to cancer center by volunteer where mother works to be taken home in personal vehicle.

## 2015-01-06 NOTE — Discharge Instructions (Signed)
°  DIET:  Soft diet until tongue heals  DISCHARGE CONDITION:  stable  ACTIVITY:  Activity as tolerated, keep left shoulder sling in place  OXYGEN:  Home Oxygen: No.   Oxygen Delivery: room air  DISCHARGE LOCATION:  home    ADDITIONAL DISCHARGE INSTRUCTION:pt needs to get out patient detox treatment, stop drinking   If you experience worsening of your admission symptoms, develop shortness of breath, life threatening emergency, suicidal or homicidal thoughts you must seek medical attention immediately by calling 911 or calling your MD immediately  if symptoms less severe.  You Must read complete instructions/literature along with all the possible adverse reactions/side effects for all the Medicines you take and that have been prescribed to you. Take any new Medicines after you have completely understood and accpet all the possible adverse reactions/side effects.   Please note  You were cared for by a hospitalist during your hospital stay. If you have any questions about your discharge medications or the care you received while you were in the hospital after you are discharged, you can call the unit and asked to speak with the hospitalist on call if the hospitalist that took care of you is not available. Once you are discharged, your primary care physician will handle any further medical issues. Please note that NO REFILLS for any discharge medications will be authorized once you are discharged, as it is imperative that you return to your primary care physician (or establish a relationship with a primary care physician if you do not have one) for your aftercare needs so that they can reassess your need for medications and monitor your lab values.

## 2015-03-20 ENCOUNTER — Encounter: Payer: Self-pay | Admitting: Emergency Medicine

## 2015-03-20 ENCOUNTER — Emergency Department
Admission: EM | Admit: 2015-03-20 | Discharge: 2015-03-20 | Disposition: A | Discharge status: Home / Self Care | Payer: Self-pay | Attending: Emergency Medicine | Admitting: Emergency Medicine

## 2015-03-20 DIAGNOSIS — R197 Diarrhea, unspecified: Secondary | ICD-10-CM | POA: Insufficient documentation

## 2015-03-20 DIAGNOSIS — E876 Hypokalemia: Secondary | ICD-10-CM

## 2015-03-20 DIAGNOSIS — F1029 Alcohol dependence with unspecified alcohol-induced disorder: Secondary | ICD-10-CM

## 2015-03-20 DIAGNOSIS — K859 Acute pancreatitis without necrosis or infection, unspecified: Secondary | ICD-10-CM

## 2015-03-20 DIAGNOSIS — Z720 Tobacco use: Secondary | ICD-10-CM | POA: Insufficient documentation

## 2015-03-20 DIAGNOSIS — Z79899 Other long term (current) drug therapy: Secondary | ICD-10-CM | POA: Insufficient documentation

## 2015-03-20 HISTORY — DX: Acute pancreatitis without necrosis or infection, unspecified: K85.90

## 2015-03-20 LAB — CBC
HEMATOCRIT: 47.5 % (ref 40.0–52.0)
HEMOGLOBIN: 16.3 g/dL (ref 13.0–18.0)
MCH: 33.1 pg (ref 26.0–34.0)
MCHC: 34.4 g/dL (ref 32.0–36.0)
MCV: 96.4 fL (ref 80.0–100.0)
Platelets: 285 10*3/uL (ref 150–440)
RBC: 4.92 MIL/uL (ref 4.40–5.90)
RDW: 14.6 % — ABNORMAL HIGH (ref 11.5–14.5)
WBC: 8.9 10*3/uL (ref 3.8–10.6)

## 2015-03-20 LAB — LIPASE, BLOOD: Lipase: 110 U/L — ABNORMAL HIGH (ref 22–51)

## 2015-03-20 MED ORDER — ONDANSETRON HCL 4 MG PO TABS: ORAL_TABLET | ORAL | Status: DC

## 2015-03-20 MED ORDER — OXYCODONE-ACETAMINOPHEN 5-325 MG PO TABS
2.0000 | ORAL_TABLET | Freq: Once | ORAL | Status: AC
  Administered 2015-03-20: 2 via ORAL
  Filled 2015-03-20: qty 2

## 2015-03-20 MED ORDER — POTASSIUM CHLORIDE CRYS ER 20 MEQ PO TBCR
40.0000 meq | EXTENDED_RELEASE_TABLET | Freq: Once | ORAL | Status: AC
  Administered 2015-03-20: 40 meq via ORAL
  Filled 2015-03-20: qty 2

## 2015-03-20 MED ORDER — POTASSIUM CHLORIDE CRYS ER 20 MEQ PO TBCR: 20.0000 meq | EXTENDED_RELEASE_TABLET | Freq: Two times a day (BID) | ORAL | Status: DC

## 2015-03-20 NOTE — ED Notes (Addendum)
Pt c/o upper abd. Pain for a couple of weeks with n,v, hx of pancreatitis, also states he is a daily drinker of alcohol, states he does not eat food he only drinks alcohol, last drank alcohol this am

## 2015-03-20 NOTE — ED Provider Notes (Signed)
[facility] Emergency Department Provider Note  ____________________________________________  Time seen: Approximately 6:31 PM  I have reviewed the triage vital signs and the nursing notes.   HISTORY  Chief Complaint Abdominal Pain    HPI Johnathan Rivera is a 29 y.o. male with a history of polysubstance abuse and multiple episodes of pancreatitis who presents reporting several weeks of persistent abdominal pain with nausea but no vomiting.  He states that he drinks a lot of alcohol every day.  He knows that this is bad for his pancreatitis but continues to do it anyway.  He states he has had severe pancreatitis in the past requiring hospitalization, but he came in today because he is just frustrated.  When I went into evaluate the patient and he was lying prone on the bed asleep and when I gently awaken him he said that he was in a great deal of pain.  He continues to complain of intermittent nausea but states that he has had no vomiting.  He has had some diarrhea recently.  Other than the abdominal pain which she describes as sharp, severe, stabbing, located in his epigastrium, he has had no other symptoms including no fever/chills, chest pain, shortness of breath.   Past Medical History  Diagnosis Date  . Seizures   . Polysubstance abuse   . Pancreatitis     Patient Active Problem List   Diagnosis Date Noted  . Alcohol abuse 01/04/2015  . Alcohol withdrawal seizure 01/04/2015  . Anxiety 01/04/2015  . Alcoholic hepatitis 01/04/2015  . Alcohol dependence 01/04/2015  . Elevated LFTs   . Polysubstance abuse     History reviewed. No pertinent past surgical history.  Current Outpatient Rx  Name  Route  Sig  Dispense  Refill  . acetaminophen (TYLENOL) 325 MG tablet   Oral   Take 2 tablets (650 mg total) by mouth every 6 (six) hours as needed for mild pain (or Fever >/= 101).   1 tablet   0   . atenolol (TENORMIN) 50 MG tablet   Oral   Take 1  tablet (50 mg total) by mouth daily.   30 tablet   0   . clonazePAM (KLONOPIN) 2 MG tablet   Oral   Take 1 tablet (2 mg total) by mouth 2 (two) times daily.   30 tablet   0   . ondansetron (ZOFRAN) 4 MG tablet      Take 1-2 tabs by mouth every 8 hours as needed for nausea/vomiting   30 tablet   0   . potassium chloride SA (KLOR-CON M20) 20 MEQ tablet   Oral   Take 1 tablet (20 mEq total) by mouth 2 (two) times daily.   14 tablet   0     Allergies Review of patient's allergies indicates no known allergies.  Family History  Problem Relation Age of Onset  . Diabetes Mother   . Liver disease Neg Hx   . Colon cancer Neg Hx     Social History Social History  Substance Use Topics  . Smoking status: Current Every Day Smoker -- 1.00 packs/day    Types: Cigarettes  . Smokeless tobacco: None  . Alcohol Use: 1.2 oz/week    2 Glasses of wine per week    Review of Systems Constitutional: No fever/chills Eyes: No visual changes. ENT: No sore throat. Cardiovascular: Denies chest pain. Respiratory: Denies shortness of breath. Gastrointestinal: Severe epigastric abdominal pain, nausea, no vomiting, occasional diarrhea. Genitourinary: Negative for  dysuria. Musculoskeletal: Negative for back pain. Skin: Negative for rash. Neurological: Negative for headaches, focal weakness or numbness.  10-point ROS otherwise negative.  ____________________________________________   PHYSICAL EXAM:  VITAL SIGNS: ED Triage Vitals  Enc Vitals Group     BP 03/20/15 1642 137/97 mmHg     Pulse Rate 03/20/15 1642 111     Resp 03/20/15 1642 18     Temp 03/20/15 1642 98.4 F (36.9 C)     Temp Source 03/20/15 1642 Oral     SpO2 03/20/15 1642 99 %     Weight 03/20/15 1642 170 lb (77.111 kg)     Height 03/20/15 1642 6' (1.829 m)     Head Cir --      Peak Flow --      Pain Score 03/20/15 1643 10     Pain Loc --      Pain Edu? --      Excl. in GC? --     Constitutional: Alert and  oriented. Well appearing and in no acute distress.  Vital first entered the room, the patient was lying prone asleep and I woke him gently for the exam and interview; he stated he was in severe pain. Eyes: Conjunctivae are normal. PERRL. EOMI. no scleral icterus Head: Atraumatic. Nose: No congestion/rhinnorhea. Mouth/Throat: Mucous membranes are moist.  Oropharynx non-erythematous. Neck: No stridor.   Cardiovascular: Normal rate, regular rhythm. Grossly normal heart sounds.  Good peripheral circulation. Respiratory: Normal respiratory effort.  No retractions. Lungs CTAB. Gastrointestinal: Soft with mild tenderness to palpation of the abdomen in general and moderate tenderness of the epigastrium. No distention. No abdominal bruits. No CVA tenderness.  No tenderness at McBurney's point and negative Murphy's sign. Musculoskeletal: No lower extremity tenderness nor edema.  No joint effusions. Neurologic:  Normal speech and language. No gross focal neurologic deficits are appreciated.  Skin:  Skin is warm, dry and intact. No rash noted. Psychiatric: Mood and affect are normal. Speech and behavior are normal. ____________________________________________   LABS (all labs ordered are listed, but only abnormal results are displayed)  Labs Reviewed  LIPASE, BLOOD - Abnormal; Notable for the following:    Lipase 110 (*)    All other components within normal limits  COMPREHENSIVE METABOLIC PANEL - Abnormal; Notable for the following:    Potassium 2.8 (*)    Chloride 100 (*)    Glucose, Bld 124 (*)    BUN <5 (*)    Calcium 8.8 (*)    AST 103 (*)    Alkaline Phosphatase 158 (*)    All other components within normal limits  CBC - Abnormal; Notable for the following:    RDW 14.6 (*)    All other components within normal limits   ____________________________________________  EKG  ED ECG REPORT I, FORBACH, CORY, the attending physician, personally viewed and interpreted this ECG.   Date:  03/20/2015  EKG Time: 16:52  Rate: 114  Rhythm: sinus tachycardia  Axis: Normal  Intervals:Normal  ST&T Change: No ischemic changes  ____________________________________________  RADIOLOGY   No results found.  ____________________________________________   PROCEDURES  Procedure(s) performed: None  Critical Care performed: No ____________________________________________   INITIAL IMPRESSION / ASSESSMENT AND PLAN / ED COURSE  Pertinent labs & imaging results that were available during my care of the patient were reviewed by me and considered in my medical decision making (see chart for details).  The patient has had symptoms for weeks and continues to drink heavily every day.  I do  not believe that his elevated lipase today represents acute pancreatitis; I think he suffers from subacute or chronic pancreatitis.  He does not appear acutely ill at this time.  He is slightly tachycardic but I suspect he is suffering more from alcohol withdrawal then he is from severe appendicitis.  We had a lengthy and very calm and reasonable discussion.  He recognizes that he has a problem with alcohol abuse and that it is hurting him both literally and figuratively.  We discussed, and I offered, inpatient admission for symptom control, but the patient states that he does not want to stay in the hospital.  He states that he has dealt with it so long that he will continue to do so.  I recommended that he contact an outpatient treatment facility as well as a GI doctor and I gave him names of both.  I gave him 1 dose of pain medication in the emergency department but I do not feel comfortable prescribing narcotics for this patient with an no substance abuse history.  He is not asking for any narcotic prescription states that he feels considerably better after a dose of pain medicine in the emergency department.  I gave him my usual and customary return precautions for pancreatitis.  I also gave him a  potassium supplement and a prescription for additional Klor-Con tablets.  He left ambulatory and no apparent distress and with no difficulty.  ____________________________________________  FINAL CLINICAL IMPRESSION(S) / ED DIAGNOSES  Final diagnoses:  Subacute pancreatitis  Hypokalemia  Alcohol dependence with unspecified alcohol-induced disorder      NEW MEDICATIONS STARTED DURING THIS VISIT:  Discharge Medication List as of 03/20/2015  8:01 PM    START taking these medications   Details  ondansetron (ZOFRAN) 4 MG tablet Take 1-2 tabs by mouth every 8 hours as needed for nausea/vomiting, Print    potassium chloride SA (KLOR-CON M20) 20 MEQ tablet Take 1 tablet (20 mEq total) by mouth 2 (two) times daily., Starting 03/20/2015, Until Discontinued, Print         Loleta Rose, MD 03/20/15 2217

## 2015-03-20 NOTE — Discharge Instructions (Signed)
As we discussed, your lipase is slightly elevated today which suggests pancreatitis, but your symptoms have been going on for weeks and the rest her labs are reassuring.  We discussed treatment in the hospital but you said you would rather pursue outpatient treatment which we believe is a reasonable option.  Remember that as long as you keep drinking alcohol you continue having problems with her pancreas.  We gave you the name and number of a gastroenterologist (GI doctor) who may be able to help with the pancreatitis as well as information about an alcohol detox facility here in [place] ([facility] [place]).  You mentioned that you have some other ideas about a facility in [place] that may be able to help; we recommend that you do follow up with this at the next available opportunity.  Please return to the Emergency Department with new or worsening symptoms that concern you.   Acute Pancreatitis Acute pancreatitis is a disease in which the pancreas becomes suddenly irritated (inflamed). The pancreas is a large gland behind your stomach. The pancreas makes enzymes that help digest food. The pancreas also makes 2 hormones that help control your blood sugar. Acute pancreatitis happens when the enzymes attack and damage the pancreas. Most attacks last a couple of days and can cause serious problems. HOME CARE  Follow your doctor's diet instructions. You may need to avoid alcohol and limit fat in your diet.  Eat small meals often.  Drink enough fluids to keep your pee (urine) clear or pale yellow.  Only take medicines as told by your doctor.  Avoid drinking alcohol if it caused your disease.  Do not smoke.  Get plenty of rest.  Check your blood sugar at home as told by your doctor.  Keep all doctor visits as told. GET HELP IF:  You do not get better as quickly as expected.  You have new or worsening symptoms.  You have lasting pain, weakness, or feel sick to your  stomach (nauseous).  You get better and then have another pain attack. GET HELP RIGHT AWAY IF:   You are unable to eat or keep fluids down.  Your pain becomes severe.  You have a fever or lasting symptoms for more than 2 to 3 days.  You have a fever and your symptoms suddenly get worse.  Your skin or the white part of your eyes turn yellow (jaundice).  You throw up (vomit).  You feel dizzy, or you pass out (faint).  Your blood sugar is high (over 300 mg/dL). MAKE SURE YOU:   Understand these instructions.  Will watch your condition.  Will get help right away if you are not doing well or get worse. Document Released: 12/04/2007 Document Revised: 11/01/2013 Document Reviewed: 09/26/2011 [facility] Patient Information 2015 [place], [place]. This information is not intended to replace advice given to you by your health care provider. Make sure you discuss any questions you have with your health care provider.  Finding Treatment for Alcohol and Drug Addiction It can be hard to find the right place to get professional treatment. Here are some important things to consider:  There are different types of treatment to choose from.  Some programs are live-in (residential) while others are not (outpatient). Sometimes a combination is offered.  No single type of program is right for everyone.  Most treatment programs involve a combination of education, counseling, and a 12-step, spiritually-based approach.  There are non-spiritually based programs (not 12-step).  Some treatment programs are  government sponsored. They are geared for patients without private insurance.  Treatment programs can vary in many respects such as:  Cost and types of insurance accepted.  Types of on-site medical services offered.  Length of stay, setting, and size.  Overall philosophy of treatment. A person may need specialized treatment or have needs not addressed by all programs. For example,  adolescents need treatment appropriate for their age. Other people have secondary disorders that must be managed as well. Secondary conditions can include mental illness, such as depression or diabetes. Often, a period of detoxification from alcohol or drugs is needed. This requires medical supervision and not all programs offer this. THINGS TO CONSIDER WHEN SELECTING A TREATMENT PROGRAM   Is the program certified by the appropriate government agency? Even private programs must be certified and employ certified professionals.  Does the program accept your insurance? If not, can a payment plan be set up?  Is the facility clean, organized, and well run? Do they allow you to speak with graduates who can share their treatment experience with you? Can you tour the facility? Can you meet with staff?  Does the program meet the full range of individual needs?  Does the treatment program address sexual orientation and physical disabilities? Do they provide age, gender, and culturally appropriate treatment services?  Is treatment available in languages other than English?  Is long-term aftercare support or guidance encouraged and provided?  Is assessment of an individual's treatment plan ongoing to ensure it meets changing needs?  Does the program use strategies to encourage reluctant patients to remain in treatment long enough to increase the likelihood of success?  Does the program offer counseling (individual or group) and other behavioral therapies?  Does the program offer medicine as part of the treatment regimen, if needed?  Is there ongoing monitoring of possible relapse? Is there a defined relapse prevention program? Are services or referrals offered to family members to ensure they understand addiction and the recovery process? This would help them support the recovering individual.  Are 12-step meetings held at the center or is transport available for patients to attend outside  meetings? In countries outside of the [place]. and [place], Magazine features editor for contact information for services in your area. Document Released: 05/16/2005 Document Revised: 09/09/2011 Document Reviewed: 11/26/2007 [facility] Patient Information 2015 [place], [place]. This information is not intended to replace advice given to you by your health care provider. Make sure you discuss any questions you have with your health care provider.  Alcohol Use Disorder Alcohol use disorder is a mental disorder. It is not a one-time incident of heavy drinking. Alcohol use disorder is the excessive and uncontrollable use of alcohol over time that leads to problems with functioning in one or more areas of daily living. People with this disorder risk harming themselves and others when they drink to excess. Alcohol use disorder also can cause other mental disorders, such as mood and anxiety disorders, and serious physical problems. People with alcohol use disorder often misuse other drugs.  Alcohol use disorder is common and widespread. Some people with this disorder drink alcohol to cope with or escape from negative life events. Others drink to relieve chronic pain or symptoms of mental illness. People with a family history of alcohol use disorder are at higher risk of losing control and using alcohol to excess.  SYMPTOMS  Signs and symptoms of alcohol use disorder may include the following:   Consumption ofalcohol inlarger amounts or over a longer period  of time than intended.  Multiple unsuccessful attempts to cutdown or control alcohol use.   A great deal of time spent obtaining alcohol, using alcohol, or recovering from the effects of alcohol (hangover).  A strong desire or urge to use alcohol (cravings).   Continued use of alcohol despite problems at work, school, or home because of alcohol use.   Continued use of alcohol despite problems in relationships because of alcohol use.  Continued use of  alcohol in situations when it is physically hazardous, such as driving a car.  Continued use of alcohol despite awareness of a physical or psychological problem that is likely related to alcohol use. Physical problems related to alcohol use can involve the brain, heart, liver, stomach, and intestines. Psychological problems related to alcohol use include intoxication, depression, anxiety, psychosis, delirium, and dementia.   The need for increased amounts of alcohol to achieve the same desired effect, or a decreased effect from the consumption of the same amount of alcohol (tolerance).  Withdrawal symptoms upon reducing or stopping alcohol use, or alcohol use to reduce or avoid withdrawal symptoms. Withdrawal symptoms include:  Racing heart.  Hand tremor.  Difficulty sleeping.  Nausea.  Vomiting.  Hallucinations.  Restlessness.  Seizures. DIAGNOSIS Alcohol use disorder is diagnosed through an assessment by your health care provider. Your health care provider may start by asking three or four questions to screen for excessive or problematic alcohol use. To confirm a diagnosis of alcohol use disorder, at least two symptoms must be present within a 31-month period. The severity of alcohol use disorder depends on the number of symptoms:  Mild--two or three.  Moderate--four or five.  Severe--six or more. Your health care provider may perform a physical exam or use results from lab tests to see if you have physical problems resulting from alcohol use. Your health care provider may refer you to a mental health professional for evaluation. TREATMENT  Some people with alcohol use disorder are able to reduce their alcohol use to low-risk levels. Some people with alcohol use disorder need to quit drinking alcohol. When necessary, mental health professionals with specialized training in substance use treatment can help. Your health care provider can help you decide how severe your alcohol use  disorder is and what type of treatment you need. The following forms of treatment are available:   Detoxification. Detoxification involves the use of prescription medicines to prevent alcohol withdrawal symptoms in the first week after quitting. This is important for people with a history of symptoms of withdrawal and for heavy drinkers who are likely to have withdrawal symptoms. Alcohol withdrawal can be dangerous and, in severe cases, cause death. Detoxification is usually provided in a hospital or in-patient substance use treatment facility.  Counseling or talk therapy. Talk therapy is provided by substance use treatment counselors. It addresses the reasons people use alcohol and ways to keep them from drinking again. The goals of talk therapy are to help people with alcohol use disorder find healthy activities and ways to cope with life stress, to identify and avoid triggers for alcohol use, and to handle cravings, which can cause relapse.  Medicines.Different medicines can help treat alcohol use disorder through the following actions:  Decrease alcohol cravings.  Decrease the positive reward response felt from alcohol use.  Produce an uncomfortable physical reaction when alcohol is used (aversion therapy).  Support groups. Support groups are run by people who have quit drinking. They provide emotional support, advice, and guidance. These forms of treatment  are often combined. Some people with alcohol use disorder benefit from intensive combination treatment provided by specialized substance use treatment centers. Both inpatient and outpatient treatment programs are available. Document Released: 07/25/2004 Document Revised: 11/01/2013 Document Reviewed: 09/24/2012 [facility] Patient Information 2015 [place], [place]. This information is not intended to replace advice given to you by your health care provider. Make sure you discuss any questions you have with your health care provider.

## 2015-03-21 ENCOUNTER — Emergency Department: Payer: Self-pay

## 2015-03-21 ENCOUNTER — Inpatient Hospital Stay
Admission: EM | Admit: 2015-03-21 | Discharge: 2015-03-23 | DRG: 896 | Disposition: A | Discharge status: Home / Self Care | Payer: Self-pay | Attending: Internal Medicine | Admitting: Internal Medicine

## 2015-03-21 ENCOUNTER — Encounter: Payer: Self-pay | Admitting: Emergency Medicine

## 2015-03-21 DIAGNOSIS — F10939 Alcohol use, unspecified with withdrawal, unspecified: Secondary | ICD-10-CM | POA: Diagnosis present

## 2015-03-21 DIAGNOSIS — E876 Hypokalemia: Secondary | ICD-10-CM | POA: Diagnosis present

## 2015-03-21 DIAGNOSIS — F17210 Nicotine dependence, cigarettes, uncomplicated: Secondary | ICD-10-CM | POA: Diagnosis present

## 2015-03-21 DIAGNOSIS — R000 Tachycardia, unspecified: Secondary | ICD-10-CM | POA: Diagnosis present

## 2015-03-21 DIAGNOSIS — R569 Unspecified convulsions: Secondary | ICD-10-CM | POA: Diagnosis present

## 2015-03-21 DIAGNOSIS — Z801 Family history of malignant neoplasm of trachea, bronchus and lung: Secondary | ICD-10-CM

## 2015-03-21 DIAGNOSIS — Z833 Family history of diabetes mellitus: Secondary | ICD-10-CM

## 2015-03-21 DIAGNOSIS — F10930 Alcohol use, unspecified with withdrawal, uncomplicated: Secondary | ICD-10-CM

## 2015-03-21 DIAGNOSIS — F10230 Alcohol dependence with withdrawal, uncomplicated: Secondary | ICD-10-CM

## 2015-03-21 DIAGNOSIS — K852 Alcohol induced acute pancreatitis without necrosis or infection: Secondary | ICD-10-CM | POA: Diagnosis present

## 2015-03-21 DIAGNOSIS — K7010 Alcoholic hepatitis without ascites: Secondary | ICD-10-CM | POA: Diagnosis present

## 2015-03-21 DIAGNOSIS — F10239 Alcohol dependence with withdrawal, unspecified: Principal | ICD-10-CM | POA: Diagnosis present

## 2015-03-21 DIAGNOSIS — R7989 Other specified abnormal findings of blood chemistry: Secondary | ICD-10-CM | POA: Diagnosis present

## 2015-03-21 DIAGNOSIS — Z79899 Other long term (current) drug therapy: Secondary | ICD-10-CM

## 2015-03-21 DIAGNOSIS — I10 Essential (primary) hypertension: Secondary | ICD-10-CM | POA: Diagnosis present

## 2015-03-21 DIAGNOSIS — J45909 Unspecified asthma, uncomplicated: Secondary | ICD-10-CM | POA: Diagnosis present

## 2015-03-21 HISTORY — DX: Unspecified asthma, uncomplicated: J45.909

## 2015-03-21 HISTORY — DX: Alcohol dependence, uncomplicated: F10.20

## 2015-03-21 LAB — COMPREHENSIVE METABOLIC PANEL
ALBUMIN: 3.7 g/dL (ref 3.5–5.0)
ALBUMIN: 3.7 g/dL (ref 3.5–5.0)
ALT: 24 U/L (ref 17–63)
ALT: 32 U/L (ref 17–63)
ANION GAP: 15 (ref 5–15)
AST: 103 U/L — ABNORMAL HIGH (ref 15–41)
AST: 207 U/L — AB (ref 15–41)
Alkaline Phosphatase: 158 U/L — ABNORMAL HIGH (ref 38–126)
Alkaline Phosphatase: 168 U/L — ABNORMAL HIGH (ref 38–126)
Anion gap: 17 — ABNORMAL HIGH (ref 5–15)
BILIRUBIN TOTAL: 0.9 mg/dL (ref 0.3–1.2)
BUN: <5 mg/dL — ABNORMAL LOW (ref 6–20)
BUN: 7 mg/dL (ref 6–20)
CALCIUM: 8.8 mg/dL — AB (ref 8.9–10.3)
CHLORIDE: 100 mmol/L — AB (ref 101–111)
CO2: 26 mmol/L (ref 22–32)
CO2: 24 mmol/L (ref 22–32)
CREATININE: 0.72 mg/dL (ref 0.61–1.24)
Calcium: 8.5 mg/dL — ABNORMAL LOW (ref 8.9–10.3)
Chloride: 100 mmol/L — ABNORMAL LOW (ref 101–111)
Creatinine, Ser: 0.71 mg/dL (ref 0.61–1.24)
GFR calc Af Amer: >60 mL/min (ref >60)
GFR calc Af Amer: >60 mL/min (ref >60)
GFR calc non Af Amer: >60 mL/min (ref >60)
GLUCOSE: 124 mg/dL — AB (ref 65–99)
GLUCOSE: 90 mg/dL (ref 65–99)
POTASSIUM: 2.8 mmol/L — AB (ref 3.5–5.1)
Potassium: 2.8 mmol/L — CL (ref 3.5–5.1)
Sodium: 141 mmol/L (ref 135–145)
Sodium: 141 mmol/L (ref 135–145)
Total Bilirubin: 0.6 mg/dL (ref 0.3–1.2)
Total Protein: 7.8 g/dL (ref 6.5–8.1)
Total Protein: 7.6 g/dL (ref 6.5–8.1)

## 2015-03-21 LAB — TROPONIN I: Troponin I: <0.03 ng/mL (ref <0.031)

## 2015-03-21 LAB — PROTIME-INR
INR: 1.01
PROTHROMBIN TIME: 13.5 s (ref 11.4–15.0)

## 2015-03-21 LAB — CBC
HEMATOCRIT: 43.1 % (ref 40.0–52.0)
Hemoglobin: 14.8 g/dL (ref 13.0–18.0)
MCH: 33.1 pg (ref 26.0–34.0)
MCHC: 34.3 g/dL (ref 32.0–36.0)
MCV: 96.7 fL (ref 80.0–100.0)
PLATELETS: 271 10*3/uL (ref 150–440)
RBC: 4.46 MIL/uL (ref 4.40–5.90)
RDW: 14.5 % (ref 11.5–14.5)
WBC: 11.8 10*3/uL — AB (ref 3.8–10.6)

## 2015-03-21 LAB — LIPASE, BLOOD: LIPASE: 122 U/L — AB (ref 22–51)

## 2015-03-21 LAB — SALICYLATE LEVEL

## 2015-03-21 LAB — LIPID PANEL
CHOL/HDL RATIO: 2.9 ratio
Cholesterol: 142 mg/dL (ref 0–200)
HDL: 49 mg/dL (ref >40)
LDL CALC: 54 mg/dL (ref 0–99)
TRIGLYCERIDES: 193 mg/dL — AB (ref <150)
VLDL: 39 mg/dL (ref 0–40)

## 2015-03-21 LAB — MAGNESIUM: Magnesium: 1.5 mg/dL — ABNORMAL LOW (ref 1.7–2.4)

## 2015-03-21 LAB — GLUCOSE, CAPILLARY: GLUCOSE-CAPILLARY: 100 mg/dL — AB (ref 65–99)

## 2015-03-21 LAB — ACETAMINOPHEN LEVEL: Acetaminophen (Tylenol), Serum: <10 ug/mL — ABNORMAL LOW (ref 10–30)

## 2015-03-21 LAB — ETHANOL: Alcohol, Ethyl (B): 83 mg/dL — ABNORMAL HIGH (ref <5)

## 2015-03-21 MED ORDER — SODIUM CHLORIDE 0.9 % IJ SOLN
3.0000 mL | Freq: Two times a day (BID) | INTRAMUSCULAR | Status: DC
  Administered 2015-03-21 – 2015-03-23 (×5): 3 mL via INTRAVENOUS

## 2015-03-21 MED ORDER — LORAZEPAM 2 MG/ML IJ SOLN: 0.0000 mg | Freq: Four times a day (QID) | INTRAMUSCULAR | Status: DC

## 2015-03-21 MED ORDER — LORAZEPAM 2 MG/ML IJ SOLN
2.0000 mg | Freq: Once | INTRAMUSCULAR | Status: AC
  Administered 2015-03-21: 2 mg via INTRAVENOUS

## 2015-03-21 MED ORDER — LORAZEPAM 2 MG/ML IJ SOLN
2.0000 mg | Freq: Once | INTRAMUSCULAR | Status: AC
  Administered 2015-03-21: 2 mg via INTRAVENOUS
  Filled 2015-03-21: qty 1

## 2015-03-21 MED ORDER — POTASSIUM CHLORIDE CRYS ER 20 MEQ PO TBCR
20.0000 meq | EXTENDED_RELEASE_TABLET | Freq: Two times a day (BID) | ORAL | Status: DC
  Administered 2015-03-21 (×2): 20 meq via ORAL
  Filled 2015-03-21 (×2): qty 1

## 2015-03-21 MED ORDER — VITAMIN B-1 100 MG PO TABS
100.0000 mg | ORAL_TABLET | Freq: Every day | ORAL | Status: DC
  Administered 2015-03-21: 100 mg via ORAL
  Filled 2015-03-21: qty 1

## 2015-03-21 MED ORDER — CLONIDINE HCL 0.1 MG PO TABS
ORAL_TABLET | ORAL | Status: AC
  Filled 2015-03-21: qty 1

## 2015-03-21 MED ORDER — SODIUM CHLORIDE 0.9 % IV BOLUS (SEPSIS)
1000.0000 mL | Freq: Once | INTRAVENOUS | Status: AC
  Administered 2015-03-21: 1000 mL via INTRAVENOUS

## 2015-03-21 MED ORDER — LABETALOL HCL 5 MG/ML IV SOLN
10.0000 mg | INTRAVENOUS | Status: DC | PRN
  Administered 2015-03-22 (×2): 10 mg via INTRAVENOUS
  Filled 2015-03-21 (×2): qty 4

## 2015-03-21 MED ORDER — ENOXAPARIN SODIUM 40 MG/0.4ML ~~LOC~~ SOLN
40.0000 mg | SUBCUTANEOUS | Status: DC
  Administered 2015-03-21 – 2015-03-23 (×3): 40 mg via SUBCUTANEOUS
  Filled 2015-03-21 (×3): qty 0.4

## 2015-03-21 MED ORDER — SODIUM CHLORIDE 0.9 % IV SOLN
INTRAVENOUS | Status: AC
  Administered 2015-03-21: 09:00:00 via INTRAVENOUS

## 2015-03-21 MED ORDER — CLONIDINE HCL 0.1 MG PO TABS
0.1000 mg | ORAL_TABLET | Freq: Once | ORAL | Status: AC
  Administered 2015-03-21: 0.1 mg via ORAL

## 2015-03-21 MED ORDER — PANTOPRAZOLE SODIUM 40 MG IV SOLR
40.0000 mg | Freq: Two times a day (BID) | INTRAVENOUS | Status: DC
  Administered 2015-03-21 – 2015-03-22 (×3): 40 mg via INTRAVENOUS
  Filled 2015-03-21 (×3): qty 40

## 2015-03-21 MED ORDER — FOLIC ACID 1 MG PO TABS
1.0000 mg | ORAL_TABLET | Freq: Every day | ORAL | Status: DC
  Administered 2015-03-22 – 2015-03-23 (×2): 1 mg via ORAL
  Filled 2015-03-21 (×2): qty 1

## 2015-03-21 MED ORDER — POTASSIUM CHLORIDE 10 MEQ/100ML IV SOLN
10.0000 meq | INTRAVENOUS | Status: AC
  Administered 2015-03-21 (×3): 10 meq via INTRAVENOUS
  Filled 2015-03-21 (×5): qty 100

## 2015-03-21 MED ORDER — LORAZEPAM 2 MG PO TABS
ORAL_TABLET | ORAL | Status: AC
  Administered 2015-03-21: 2 mg via ORAL
  Filled 2015-03-21: qty 1

## 2015-03-21 MED ORDER — LORAZEPAM 2 MG PO TABS: 0.0000 mg | ORAL_TABLET | Freq: Two times a day (BID) | ORAL | Status: DC

## 2015-03-21 MED ORDER — THIAMINE HCL 100 MG/ML IJ SOLN
100.0000 mg | Freq: Every day | INTRAMUSCULAR | Status: DC
  Filled 2015-03-21: qty 1

## 2015-03-21 MED ORDER — ATENOLOL 50 MG PO TABS
50.0000 mg | ORAL_TABLET | Freq: Every day | ORAL | Status: DC
  Administered 2015-03-21 – 2015-03-23 (×3): 50 mg via ORAL
  Filled 2015-03-21 (×3): qty 1

## 2015-03-21 MED ORDER — LORAZEPAM 2 MG/ML IJ SOLN: 0.0000 mg | Freq: Two times a day (BID) | INTRAMUSCULAR | Status: DC

## 2015-03-21 MED ORDER — MORPHINE SULFATE (PF) 2 MG/ML IV SOLN
2.0000 mg | INTRAVENOUS | Status: DC | PRN
  Administered 2015-03-21 (×2): 2 mg via INTRAVENOUS
  Filled 2015-03-21 (×3): qty 1

## 2015-03-21 MED ORDER — ADULT MULTIVITAMIN W/MINERALS CH
1.0000 | ORAL_TABLET | Freq: Every day | ORAL | Status: DC
  Administered 2015-03-22 – 2015-03-23 (×2): 1 via ORAL
  Filled 2015-03-21 (×2): qty 1

## 2015-03-21 MED ORDER — VITAMIN B-1 100 MG PO TABS
100.0000 mg | ORAL_TABLET | Freq: Every day | ORAL | Status: DC
  Administered 2015-03-22 – 2015-03-23 (×2): 100 mg via ORAL
  Filled 2015-03-21 (×2): qty 1

## 2015-03-21 MED ORDER — FOLIC ACID 1 MG PO TABS
1.0000 mg | ORAL_TABLET | Freq: Every day | ORAL | Status: DC
  Administered 2015-03-21: 1 mg via ORAL
  Filled 2015-03-21: qty 1

## 2015-03-21 MED ORDER — ONDANSETRON HCL 4 MG/2ML IJ SOLN
4.0000 mg | Freq: Once | INTRAMUSCULAR | Status: AC
  Administered 2015-03-21: 4 mg via INTRAVENOUS
  Filled 2015-03-21: qty 2

## 2015-03-21 MED ORDER — ONDANSETRON HCL 4 MG/2ML IJ SOLN
4.0000 mg | Freq: Four times a day (QID) | INTRAMUSCULAR | Status: DC | PRN
  Administered 2015-03-22: 4 mg via INTRAVENOUS
  Filled 2015-03-21: qty 2

## 2015-03-21 MED ORDER — MORPHINE SULFATE (PF) 4 MG/ML IV SOLN
INTRAVENOUS | Status: AC
  Administered 2015-03-21: 4 mg
  Filled 2015-03-21: qty 1

## 2015-03-21 MED ORDER — LORAZEPAM 2 MG PO TABS
0.0000 mg | ORAL_TABLET | Freq: Four times a day (QID) | ORAL | Status: DC
Start: 2015-03-21 — End: 2015-03-21
  Administered 2015-03-21: 2 mg via ORAL
  Administered 2015-03-21: 1 mg via ORAL
  Filled 2015-03-21: qty 1

## 2015-03-21 MED ORDER — MORPHINE SULFATE (PF) 2 MG/ML IV SOLN
2.0000 mg | INTRAVENOUS | Status: DC | PRN
  Administered 2015-03-21 – 2015-03-23 (×18): 2 mg via INTRAVENOUS
  Filled 2015-03-21 (×16): qty 1

## 2015-03-21 MED ORDER — ONDANSETRON HCL 4 MG PO TABS: 4.0000 mg | ORAL_TABLET | Freq: Four times a day (QID) | ORAL | Status: DC | PRN

## 2015-03-21 MED ORDER — NICOTINE 10 MG IN INHA
1.0000 | RESPIRATORY_TRACT | Status: DC | PRN
  Administered 2015-03-22: 1 via RESPIRATORY_TRACT
  Filled 2015-03-21: qty 36

## 2015-03-21 MED ORDER — THIAMINE HCL 100 MG/ML IJ SOLN: 100.0000 mg | Freq: Every day | INTRAMUSCULAR | Status: DC

## 2015-03-21 MED ORDER — ADULT MULTIVITAMIN W/MINERALS CH
1.0000 | ORAL_TABLET | Freq: Every day | ORAL | Status: DC
  Administered 2015-03-21: 1 via ORAL
  Filled 2015-03-21: qty 1

## 2015-03-21 MED ORDER — INFLUENZA VAC SPLIT QUAD 0.5 ML IM SUSY
0.5000 mL | PREFILLED_SYRINGE | INTRAMUSCULAR | Status: DC
  Filled 2015-03-21: qty 0.5

## 2015-03-21 MED ORDER — MAGNESIUM SULFATE 2 GM/50ML IV SOLN
2.0000 g | Freq: Once | INTRAVENOUS | Status: AC
  Administered 2015-03-21: 2 g via INTRAVENOUS
  Filled 2015-03-21: qty 50

## 2015-03-21 MED ORDER — CLONAZEPAM 1 MG PO TABS
2.0000 mg | ORAL_TABLET | Freq: Two times a day (BID) | ORAL | Status: DC
  Administered 2015-03-21 – 2015-03-23 (×5): 2 mg via ORAL
  Filled 2015-03-21: qty 4
  Filled 2015-03-21: qty 2
  Filled 2015-03-21 (×2): qty 4
  Filled 2015-03-21: qty 2

## 2015-03-21 MED ORDER — POTASSIUM CHLORIDE 10 MEQ/100ML IV SOLN
10.0000 meq | Freq: Once | INTRAVENOUS | Status: AC
  Administered 2015-03-21: 10 meq via INTRAVENOUS
  Filled 2015-03-21: qty 100

## 2015-03-21 MED ORDER — LORAZEPAM 2 MG/ML IJ SOLN
1.0000 mg | Freq: Four times a day (QID) | INTRAMUSCULAR | Status: DC | PRN
  Administered 2015-03-21: 2 mg via INTRAVENOUS
  Administered 2015-03-22: 1 mg via INTRAVENOUS
  Filled 2015-03-21 (×2): qty 1

## 2015-03-21 MED ORDER — IOHEXOL 300 MG/ML  SOLN
100.0000 mL | Freq: Once | INTRAMUSCULAR | Status: AC | PRN
  Administered 2015-03-21: 100 mL via INTRAVENOUS

## 2015-03-21 MED ORDER — LORAZEPAM 2 MG/ML IJ SOLN
INTRAMUSCULAR | Status: AC
  Administered 2015-03-21: 2 mg via INTRAVENOUS
  Filled 2015-03-21: qty 1

## 2015-03-21 MED ORDER — LORAZEPAM 1 MG PO TABS
1.0000 mg | ORAL_TABLET | Freq: Four times a day (QID) | ORAL | Status: DC | PRN
  Administered 2015-03-21 – 2015-03-23 (×2): 1 mg via ORAL
  Filled 2015-03-21: qty 2
  Filled 2015-03-21: qty 1

## 2015-03-21 MED ORDER — HYDRALAZINE HCL 25 MG PO TABS
25.0000 mg | ORAL_TABLET | Freq: Three times a day (TID) | ORAL | Status: DC
  Administered 2015-03-21 – 2015-03-23 (×7): 25 mg via ORAL
  Filled 2015-03-21 (×7): qty 1

## 2015-03-21 NOTE — H&P (Signed)
[facility] - [place] at [facility]   PATIENT NAME: Johnathan Rivera    MR#:  [id number]  DATE OF BIRTH:  11-22-1985  DATE OF ADMISSION:  03/21/2015  PRIMARY CARE PHYSICIAN: Evelene Croon, MD   REQUESTING/REFERRING PHYSICIAN: Dr. Zenda Alpers  CHIEF COMPLAINT:   Chief Complaint  Patient presents with  . Seizures  . Alcohol Problem   ongoing abdominal pain and nausea  HISTORY OF PRESENT ILLNESS:  Vearl Allbaugh  is a 29 y.o. male with a known history of alcohol dependence, chronic pancreatitis and alcoholic hepatitis presents to the emergency room with the complaints of seizure episode. Patient was seen in the ED earlier yesterday for ongoing abdominal pain with nausea and diagnosed to have pancreatitis, was sent home with a referral to undergo detox. Patient developed seizure episode hence brought to the emergency room for further evaluation later in the night. 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 Patient continues to have ongoing epigastric pain with some nausea but denies any vomitings. Patient was evaluated by the ED physician and noted to have fluctuating blood pressures related to alcohol withdrawals. Workup revealed WC of 11.8, potassium 2.8, magnesium 1.5, lipase 122, alkaline phosphatase 168, AST/ALT 207/32, troponin less than 0.03, EtOH level 83. EKG with sinus tachycardia with ventricular rate of 1 25 bpm. CT of the abdomen and the pelvis minimal peripancreatic edema compatible with acute pancreatitis and diffuse fatty liver. Patient was placed on CIWA protocol, was given Ativan for agitation. Hospitalist service was consulted for further management. Patient denies any history of fever or chills, states still has nausea and abdominal pain but denies any vomitings. No chest pain or shortness of breath or diarrhea or dysuria.  PAST MEDICAL HISTORY:   Past Medical History  Diagnosis Date  . Seizures   . Polysubstance abuse   .  Pancreatitis   . Asthma   . Alcohol dependence     PAST SURGICAL HISTORY:   Past Surgical History  Procedure Laterality Date  . No past surgeries      SOCIAL HISTORY:   Social History  Substance Use Topics  . Smoking status: Current Every Day Smoker -- 1.00 packs/day    Types: Cigarettes  . Smokeless tobacco: Not on file  . Alcohol Use: 1.2 oz/week    2 Glasses of wine per week    FAMILY HISTORY:   Family History  Problem Relation Age of Onset  . Diabetes Mother   . Liver disease Neg Hx   . Colon cancer Neg Hx   . Lung cancer Father     DRUG ALLERGIES:  No Known Allergies  REVIEW OF SYSTEMS:   Review of Systems  Constitutional: Negative for fever, chills and malaise/fatigue.  HENT: Negative for ear pain, hearing loss, nosebleeds, sore throat and tinnitus.   Eyes: Negative for blurred vision, double vision, pain, discharge and redness.  Respiratory: Negative for cough, hemoptysis, sputum production, shortness of breath and wheezing.   Cardiovascular: Negative for chest pain, palpitations, orthopnea and leg swelling.  Gastrointestinal: Positive for nausea, vomiting and abdominal pain. Negative for diarrhea, constipation, blood in stool and melena.  Genitourinary: Negative for dysuria, urgency, frequency and hematuria.  Musculoskeletal: Negative for back pain, joint pain and neck pain.  Skin: Negative for itching and rash.  Neurological: Positive for seizures. Negative for dizziness, tingling, sensory change and focal weakness.  Endo/Heme/Allergies: Does not bruise/bleed easily.  Psychiatric/Behavioral: Negative for depression. The patient is not nervous/anxious.     MEDICATIONS AT HOME:  Prior to Admission medications   Medication Sig Start Date End Date Taking? Authorizing Provider  acetaminophen (TYLENOL) 325 MG tablet Take 2 tablets (650 mg total) by mouth every 6 (six) hours as needed for mild pain (or Fever >/= 101). 01/06/15   Auburn Bilberry, MD  atenolol  (TENORMIN) 50 MG tablet Take 1 tablet (50 mg total) by mouth daily. 01/06/15   Auburn Bilberry, MD  clonazePAM (KLONOPIN) 2 MG tablet Take 1 tablet (2 mg total) by mouth 2 (two) times daily. 01/06/15   Auburn Bilberry, MD  ondansetron (ZOFRAN) 4 MG tablet Take 1-2 tabs by mouth every 8 hours as needed for nausea/vomiting 03/20/15   Loleta Rose, MD  potassium chloride SA (KLOR-CON M20) 20 MEQ tablet Take 1 tablet (20 mEq total) by mouth 2 (two) times daily. 03/20/15   Loleta Rose, MD      VITAL SIGNS:  Blood pressure 196/108, pulse 134, resp. rate 13, SpO2 99 %.  PHYSICAL EXAMINATION:  Physical Exam  Constitutional: He is oriented to person, place, and time. He appears well-developed and well-nourished.  Mild distress present because of epigastric pain  HENT:  Head: Normocephalic and atraumatic.  Right Ear: External ear normal.  Left Ear: External ear normal.  Nose: Nose normal.  Mouth/Throat: Oropharynx is clear and moist. No oropharyngeal exudate.  Eyes: EOM are normal. Pupils are equal, round, and reactive to light. No scleral icterus.  Neck: Normal range of motion. Neck supple. No JVD present. No thyromegaly present.  Cardiovascular: Regular rhythm, normal heart sounds and intact distal pulses.  Exam reveals no friction rub.   No murmur heard. Tachycardia +  Respiratory: Effort normal and breath sounds normal. No respiratory distress. He has no wheezes. He has no rales. He exhibits no tenderness.  GI: Soft. Bowel sounds are normal. He exhibits no distension and no mass. There is tenderness (epigastrium). There is no rebound and no guarding.  Musculoskeletal: Normal range of motion. He exhibits no edema.  Lymphadenopathy:    He has no cervical adenopathy.  Neurological: He is alert and oriented to person, place, and time. He has normal reflexes. He displays normal reflexes. No cranial nerve deficit. He exhibits normal muscle tone.  Skin: Skin is warm. No rash noted. No erythema.   Psychiatric: He has a normal mood and affect. His behavior is normal. Thought content normal.   LABORATORY PANEL:   CBC  Recent Labs Lab 03/21/15 0145  WBC 11.8*  HGB 14.8  HCT 43.1  PLT 271   ------------------------------------------------------------------------------------------------------------------  Chemistries   Recent Labs Lab 03/21/15 0145  NA 141  K 2.8*  CL 100*  CO2 24  GLUCOSE 90  BUN 7  CREATININE 0.72  CALCIUM 8.5*  MG 1.5*  AST 207*  ALT 32  ALKPHOS 168*  BILITOT 0.9   ------------------------------------------------------------------------------------------------------------------  Cardiac Enzymes  Recent Labs Lab 03/21/15 0145  TROPONINI <0.03   ------------------------------------------------------------------------------------------------------------------  RADIOLOGY:  Ct Abdomen Pelvis W Contrast  03/21/2015   CLINICAL DATA:  Seizure, chronic alcohol use with last ethanol intake 03/20/2015, nausea without vomiting, mid abdominal pain 9.5/10, prior pancreatitis, asthma  EXAM: CT ABDOMEN AND PELVIS WITH CONTRAST  TECHNIQUE: Multidetector CT imaging of the abdomen and pelvis was performed using the standard protocol following bolus administration of intravenous contrast. Sagittal and coronal MPR images reconstructed from axial data set.  CONTRAST:  OMNIPAQUE IOHEXOL 300 MG/ML SOLN IV. Dilute oral contrast.  COMPARISON:  None  FINDINGS: Lung bases clear.  Diffuse fatty infiltration of liver.  Minimal peripancreatic infiltrative changes compatible with acute pancreatitis.  No pancreatitis associated fluid collections or venous occlusion identified.  No pancreatic calcifications, mass, or ductal dilatation seen.  Liver, spleen, kidneys, and adrenal glands unremarkable.  Stomach and small bowel loops normal appearance.  Colon wall appears diffusely slightly prominent though the colon is unopacified and under distended, potentially artifact.   Bladder and ureters unremarkable.  No mass, adenopathy, free air or free fluid.  Osseous structures unremarkable.  IMPRESSION: Minimal peripancreatic edema compatible with acute pancreatitis, recommend correlation with serum lipase/amylase.  Diffuse fatty infiltration of liver.  Portions of the colon wall appear thickened though this potentially an artifact related to underdistention ; if patient has diarrhea or GI symptoms, consider further evaluation.   Electronically Signed   By: Ulyses Southward M.D.   On: 03/21/2015 04:33    EKG:   Orders placed or performed during the hospital encounter of 03/21/15  . ED EKG  . ED EKG  . EKG 12-Lead  . EKG 12-Lead  Sinus tachycardia with ventricular 1 25 bpm.  IMPRESSION AND PLAN:   29 year old male with history of alcohol dependence, alcoholic pancreatitis and hepatitis presents to the emergency room with the complaints of seizure episode and ongoing abdominal pain with nausea and vomiting for the past few days, found to have CT findings compatible with acute pancreatitis with lipase 122. 1. Alcohol withdrawal seizure and alcohol withdrawal symptoms. Plan: Admit to telemetry, continues CIWA protocol, seizure precautions. 2. Acute alcoholic pancreatitis. Plan: Nothing by mouth, IV fluids, pain control medications, IV Protonix. GI consultation for further evaluation and advice. Follow-up lipase and LFTs. 3. Hypokalemia, replace potassium. Follow-up BMP. 4. Hypomagnesemia secondary to chronic alcohol usage. Mag replaced, follow-up mag levels. 5. Alcoholic hepatitis. Monitor LFTs closely.    All the records are reviewed and case discussed with ED provider. Management plans discussed with the patient and  in agreement.  CODE STATUS: Full code  TOTAL TIME TAKING CARE OF THIS PATIENT: 50 minutes.    Jonnie Kind N M.D on 03/21/2015 at 5:30 AM  Between 7am to 6pm - Pager - [phone]  After 6pm go to [email or web address] - password [email or web address] [facility]  [place]  [facility]  Office  [phone]  CC: Primary care physician; Evelene Croon, MD

## 2015-03-21 NOTE — ED Notes (Signed)
Pt brought to ER by [facility] from home. Per EMS pt's sister reports that pt had a seizure. Per EMS sister stated "pt went stiff".  Pt had been seen in this ER around 1500 03/20/15 for possible pancreatitis and was discharged home around 2000 03/20/15.  Pt reports hx of chronic alcohol use, pt reports last drink was 03/20/15 am.  Pt reports nausea but no vomiting. Pt reporting 9.5/10 pain in mid abdomen that is "pretty much there all the time" pt describes the Pain as a sharp stabbing pain. Pt was going to go to a detox program in [place] that had been recommended to him either on 9/21 or 9/22. Pt reports that he mostly  Drinks multiple wines, but does drink liquor at times.

## 2015-03-21 NOTE — Progress Notes (Signed)
[facility] - [place] at [facility]   PATIENT NAME: Johnathan Rivera    MR#:  [id number]  DATE OF BIRTH:  09-Jan-1986  SUBJECTIVE:  CHIEF COMPLAINT:   Chief Complaint  Patient presents with  . Seizures  . Alcohol Problem   came with alcohol withdrawal seizures. Have abdominal pain and pancreatitis for 2 days.   Pain is under control with meds now.  REVIEW OF SYSTEMS:  CONSTITUTIONAL: No fever, fatigue or weakness.  EYES: No blurred or double vision.  EARS, NOSE, AND THROAT: No tinnitus or ear pain.  RESPIRATORY: No cough, shortness of breath, wheezing or hemoptysis.  CARDIOVASCULAR: No chest pain, orthopnea, edema.  GASTROINTESTINAL: No nausea, vomiting, diarrhea , mild abdominal pain.  GENITOURINARY: No dysuria, hematuria.  ENDOCRINE: No polyuria, nocturia,  HEMATOLOGY: No anemia, easy bruising or bleeding SKIN: No rash or lesion. MUSCULOSKELETAL: No joint pain or arthritis.   NEUROLOGIC: No tingling, numbness, weakness.  PSYCHIATRY: No anxiety or depression.   ROS  DRUG ALLERGIES:  No Known Allergies  VITALS:  Blood pressure 147/116, pulse 98, temperature 98.2 F (36.8 C), temperature source Oral, resp. rate 17, height 6' (1.829 m), weight 76.6 kg (168 lb 14 oz), SpO2 98 %.  PHYSICAL EXAMINATION:  GENERAL:  29 y.o.-year-old patient lying in the bed with no acute distress.  EYES: Pupils equal, round, reactive to light and accommodation. No scleral icterus. Extraocular muscles intact.  HEENT: Head atraumatic, normocephalic. Oropharynx and nasopharynx clear.  NECK:  Supple, no jugular venous distention. No thyroid enlargement, no tenderness.  LUNGS: Normal breath sounds bilaterally, no wheezing, rales,rhonchi or crepitation. No use of accessory muscles of respiration.  CARDIOVASCULAR: S1, S2 normal. No murmurs, rubs, or gallops.  ABDOMEN: Soft, mild epigastric tender, nondistended. Bowel sounds present. No organomegaly or mass.  EXTREMITIES: No  pedal edema, cyanosis, or clubbing.  NEUROLOGIC: Cranial nerves II through XII are intact. Muscle strength 5/5 in all extremities. Sensation intact. Gait not checked.  PSYCHIATRIC: The patient is alert and oriented x 3.  SKIN: No obvious rash, lesion, or ulcer.   Physical Exam LABORATORY PANEL:   CBC  Recent Labs Lab 03/21/15 0145  WBC 11.8*  HGB 14.8  HCT 43.1  PLT 271   ------------------------------------------------------------------------------------------------------------------  Chemistries   Recent Labs Lab 03/21/15 0145  NA 141  K 2.8*  CL 100*  CO2 24  GLUCOSE 90  BUN 7  CREATININE 0.72  CALCIUM 8.5*  MG 1.5*  AST 207*  ALT 32  ALKPHOS 168*  BILITOT 0.9   ------------------------------------------------------------------------------------------------------------------  Cardiac Enzymes  Recent Labs Lab 03/21/15 0145  TROPONINI <0.03   ------------------------------------------------------------------------------------------------------------------  RADIOLOGY:  Ct Abdomen Pelvis W Contrast  03/21/2015   CLINICAL DATA:  Seizure, chronic alcohol use with last ethanol intake 03/20/2015, nausea without vomiting, mid abdominal pain 9.5/10, prior pancreatitis, asthma  EXAM: CT ABDOMEN AND PELVIS WITH CONTRAST  TECHNIQUE: Multidetector CT imaging of the abdomen and pelvis was performed using the standard protocol following bolus administration of intravenous contrast. Sagittal and coronal MPR images reconstructed from axial data set.  CONTRAST:  OMNIPAQUE IOHEXOL 300 MG/ML SOLN IV. Dilute oral contrast.  COMPARISON:  None  FINDINGS: Lung bases clear.  Diffuse fatty infiltration of liver.  Minimal peripancreatic infiltrative changes compatible with acute pancreatitis.  No pancreatitis associated fluid collections or venous occlusion identified.  No pancreatic calcifications, mass, or ductal dilatation seen.  Liver, spleen, kidneys, and adrenal glands  unremarkable.  Stomach and small bowel loops normal appearance.  Colon  wall appears diffusely slightly prominent though the colon is unopacified and under distended, potentially artifact.  Bladder and ureters unremarkable.  No mass, adenopathy, free air or free fluid.  Osseous structures unremarkable.  IMPRESSION: Minimal peripancreatic edema compatible with acute pancreatitis, recommend correlation with serum lipase/amylase.  Diffuse fatty infiltration of liver.  Portions of the colon wall appear thickened though this potentially an artifact related to underdistention ; if patient has diarrhea or GI symptoms, consider further evaluation.   Electronically Signed   By: Ulyses Southward M.D.   On: 03/21/2015 04:33    ASSESSMENT AND PLAN:   * Alcohol withdrawal seizures   On Ativan and CIWA protocol.    Stable now.  * pancreatitis- alcohol induced.   IV fluids, SUpportive care.   Started on clear liquids on his request.  * hypokalemia and hypomagnesemia    Replace.  * alcoholic hepatitis   Monitor LFT   Gi on case.   All the records are reviewed and case discussed with Care Management/Social Workerr. Management plans discussed with the patient, family and they are in agreement.  CODE STATUS: full  TOTAL TIME TAKING CARE OF THIS PATIENT: 35 minutes.     POSSIBLE D/C IN 1-2 DAYS, DEPENDING ON CLINICAL CONDITION.   Altamese Dilling M.D on 03/21/2015   Between 7am to 6pm - Pager - [phone]  After 6pm go to [email or web address] - password [email or web address] [facility]  [place] [facility]  Office  [phone]  CC: Primary care physician; Evelene Croon, MD  Note: This dictation was prepared with Dragon dictation along with smaller phrase technology. Any transcriptional errors that result from this process are unintentional.

## 2015-03-21 NOTE — Consult Note (Signed)
Consultation  Referring Provider:     Dr Betti Cruz Admit date: 03/21/15 Consult date        03/21/15 Reason for Consultation:  Acute alcoholic pancreatitis            HPI:   Johnathan Rivera is a 29 y.o. male  With history of alcohol/polysubstance abuse, admitted with  seizure attributed to excessive alcohol intake, as well as abdominal pain/pancreatitis/alcoholic hepatitis. Patient somewhat drowsy and slurring speech, appears to sleep at points in interview. Nurse reports he has recently had some morphine for his abdominal pain. In chart review, patient was admitted last July also with alcohol related seizure and complications. Was discharged soon afterwards. Also was admitted for alcoholic pancreatitis 4/16: alcohol cessation was recommended. More recently was seen by Dr York Cerise (9-19) for abdominal pain attributed to pancreatitis secondary to etoh intake. Patient declined admission- he was also counselled extensively on the health problems he is having due to etoh/other substances by same provider. Patient then brought back to ED early this am with another seizure likely related to etoh,as well as some withdrawal symptoms. Continued to complain of upper abdominal pain/nausea- lipase elevated and there were signs of mild pancreatitis, as well as fatty liver,on CT. WC of 11.8, potassium 2.8, magnesium 1.5, lipase 122, alkaline phosphatase 168, AST/ALT 207/32, troponin less than 0.03, EtOH level 83.  Currently feeling some better with pain medication and hydration, however pain persists, primarily to the epigastric area. Reports he is tolerating po liquids well. Of note, stomach and bowel loops normal. He is on PPI prophylaxis here. Denies further GI complaints. States this is his 2nd episode of pancreatitis. He has had no findings of gallstones, masses, or ductal issues on imaging this visit and on prior visits.. There is no family history of pancreatitis. Unable to quantify amount of etoh he drinks.  States it is "alot" on a qd basis. Feels like sometimes people spike his beverages with other substances. States he has never had any liver disease in past. Does have tattoos. States he has never been checked for viral hepatitis and there is no family history of liver disease. Denies ever having jaundice, ascites, bleeding problems.  Spleen in normal on imaging, and platelet normal. Pt/inr last July normal. He is also receiving electrolyte correction while here and is on the CIWA protocol.  In discussion with patient, says he does think he should stop his alcohol consumption and is ready for some help. We discussed further risks of continued consumption.   Past Medical History  Diagnosis Date  . Seizures   . Polysubstance abuse   . Pancreatitis   . Asthma   . Alcohol dependence     Past Surgical History  Procedure Laterality Date  . No past surgeries      Family History  Problem Relation Age of Onset  . Diabetes Mother   . Liver disease Neg Hx   . Colon cancer Neg Hx   . Lung cancer Father   No family history of pancreatic issues.  Social History  Substance Use Topics  . Smoking status: Current Every Day Smoker -- 1.00 packs/day    Types: Cigarettes  . Smokeless tobacco: None  . Alcohol Use: 1.2 oz/week    2 Glasses of wine per week    Prior to Admission medications   Medication Sig Start Date End Date Taking? Authorizing Provider  acetaminophen (TYLENOL) 325 MG tablet Take 2 tablets (650 mg total) by mouth every 6 (six) hours as needed for  mild pain (or Fever >/= 101). Patient not taking: Reported on 03/21/2015 01/06/15   Auburn Bilberry, MD  atenolol (TENORMIN) 50 MG tablet Take 1 tablet (50 mg total) by mouth daily. Patient not taking: Reported on 03/21/2015 01/06/15   Auburn Bilberry, MD  clonazePAM (KLONOPIN) 2 MG tablet Take 1 tablet (2 mg total) by mouth 2 (two) times daily. Patient not taking: Reported on 03/21/2015 01/06/15   Auburn Bilberry, MD  ondansetron [facility]) 4 MG  tablet Take 1-2 tabs by mouth every 8 hours as needed for nausea/vomiting Patient not taking: Reported on 03/21/2015 03/20/15   Loleta Rose, MD  potassium chloride SA (KLOR-CON M20) 20 MEQ tablet Take 1 tablet (20 mEq total) by mouth 2 (two) times daily. Patient not taking: Reported on 03/21/2015 03/20/15   Loleta Rose, MD    Current Facility-Administered Medications  Medication Dose Route Frequency Provider Last Rate Last Dose  . 0.9 %  sodium chloride infusion   Intravenous Continuous Crissie Figures, MD 125 mL/hr at 03/21/15 0908    . atenolol (TENORMIN) tablet 50 mg  50 mg Oral Daily Crissie Figures, MD   50 mg at 03/21/15 0944  . clonazePAM (KLONOPIN) tablet 2 mg  2 mg Oral BID Crissie Figures, MD   2 mg at 03/21/15 0943  . enoxaparin (LOVENOX) injection 40 mg  40 mg Subcutaneous Q24H Crissie Figures, MD   40 mg at 03/21/15 0946  . folic acid (FOLVITE) tablet 1 mg  1 mg Oral Daily Crissie Figures, MD   1 mg at 03/21/15 0944  . hydrALAZINE (APRESOLINE) tablet 25 mg  25 mg Oral 3 times per day Altamese Dilling, MD      . LORazepam (ATIVAN) injection 0-4 mg  0-4 mg Intravenous 4 times per day Rebecka Apley, MD   0 mg at 03/21/15 0244  . LORazepam (ATIVAN) injection 0-4 mg  0-4 mg Intravenous Q12H Rebecka Apley, MD   0 mg at 03/21/15 0243  . LORazepam (ATIVAN) tablet 0-4 mg  0-4 mg Oral 4 times per day Rebecka Apley, MD   1 mg at 03/21/15 1234  . LORazepam (ATIVAN) tablet 0-4 mg  0-4 mg Oral Q12H Rebecka Apley, MD   0 mg at 03/21/15 0244  . morphine 2 MG/ML injection 2 mg  2 mg Intravenous Q4H PRN Crissie Figures, MD   2 mg at 03/21/15 0935  . morphine 2 MG/ML injection 2 mg  2 mg Intravenous Q2H PRN Altamese Dilling, MD   2 mg at 03/21/15 1400  . multivitamin with minerals tablet 1 tablet  1 tablet Oral Daily Crissie Figures, MD   1 tablet at 03/21/15 0944  . ondansetron (ZOFRAN) tablet 4 mg  4 mg Oral Q6H PRN Crissie Figures, MD       Or  . ondansetron  [facility]) injection 4 mg  4 mg Intravenous Q6H PRN Crissie Figures, MD      . pantoprazole (PROTONIX) injection 40 mg  40 mg Intravenous Q12H Crissie Figures, MD   40 mg at 03/21/15 9811  . potassium chloride SA (K-DUR,KLOR-CON) CR tablet 20 mEq  20 mEq Oral BID Crissie Figures, MD   20 mEq at 03/21/15 1124  . sodium chloride 0.9 % injection 3 mL  3 mL Intravenous Q12H Crissie Figures, MD   3 mL at 03/21/15 1126  . thiamine (B-1) injection 100 mg  100 mg Intravenous Daily Rebecka Apley, MD  100 mg at 03/21/15 0945  . thiamine (VITAMIN B-1) tablet 100 mg  100 mg Oral Daily Rebecka Apley, MD   100 mg at 03/21/15 0944    Allergies as of 03/21/2015  . (No Known Allergies)     Review of Systems:    All systems reviewed and negative except where noted in HPI.      Physical Exam:  Vital signs in last 24 hours: Temp:  [97.4 F (36.3 C)-98.4 F (36.9 C)] 97.4 F (36.3 C) (09/20 1100) Pulse Rate:  [99-134] 99 (09/20 1200) Resp:  [11-20] 15 (09/20 1200) BP: (101-198)/(57-126) 158/120 mmHg (09/20 1200) SpO2:  [97 %-100 %] 98 % (09/20 1100) Weight:  [77.111 kg (170 lb)] 77.111 kg (170 lb) (09/19 1642)     General:   Drowsy man  in NAD Head:  Normocephalic and atraumatic. Eyes:   No icterus.   Conjunctiva pink. Ears:  Normal auditory acuity. Mouth: Mucosa pink moist, no lesions. Neck:  Supple; no masses felt Lungs:  Respirations even and unlabored. Lungs clear to auscultation bilaterally.   No wheezes, crackles, or rhonchi.  Heart:  S1S2, RRR, no MRG. No edema. Abdomen:   Flat, soft, nondistended, some generalized tenderness, mostly to the epigastrum. Normal bowel sounds. No appreciable masses or hepatomegaly. No rebound signs or other peritoneal signs. Msk:  MAEW x4, No clubbing or cyanosis. Strength 5/5. Symmetrical without gross deformities. Neurologic:  Alert and  oriented x4;  Cranial nerves II-XII intact.  Skin:  Warm, dry, pink without significant lesions or  rashes. Psych:  Alert and cooperative. Normal affect.  LAB RESULTS:  Recent Labs  03/20/15 1653 03/21/15 0145  WBC 8.9 11.8*  HGB 16.3 14.8  HCT 47.5 43.1  PLT 285 271   BMET  Recent Labs  03/20/15 1653 03/21/15 0145  NA 141 141  K 2.8* 2.8*  CL 100* 100*  CO2 26 24  GLUCOSE 124* 90  BUN <5* 7  CREATININE 0.71 0.72  CALCIUM 8.8* 8.5*   LFT  Recent Labs  03/21/15 0145  PROT 7.6  ALBUMIN 3.7  AST 207*  ALT 32  ALKPHOS 168*  BILITOT 0.9   PT/INR No results for input(s): LABPROT, INR in the last 72 hours.  STUDIES: Ct Abdomen Pelvis W Contrast  03/21/2015   CLINICAL DATA:  Seizure, chronic alcohol use with last ethanol intake 03/20/2015, nausea without vomiting, mid abdominal pain 9.5/10, prior pancreatitis, asthma  EXAM: CT ABDOMEN AND PELVIS WITH CONTRAST  TECHNIQUE: Multidetector CT imaging of the abdomen and pelvis was performed using the standard protocol following bolus administration of intravenous contrast. Sagittal and coronal MPR images reconstructed from axial data set.  CONTRAST:  OMNIPAQUE IOHEXOL 300 MG/ML SOLN IV. Dilute oral contrast.  COMPARISON:  None  FINDINGS: Lung bases clear.  Diffuse fatty infiltration of liver.  Minimal peripancreatic infiltrative changes compatible with acute pancreatitis.  No pancreatitis associated fluid collections or venous occlusion identified.  No pancreatic calcifications, mass, or ductal dilatation seen.  Liver, spleen, kidneys, and adrenal glands unremarkable.  Stomach and small bowel loops normal appearance.  Colon wall appears diffusely slightly prominent though the colon is unopacified and under distended, potentially artifact.  Bladder and ureters unremarkable.  No mass, adenopathy, free air or free fluid.  Osseous structures unremarkable.  IMPRESSION: Minimal peripancreatic edema compatible with acute pancreatitis, recommend correlation with serum lipase/amylase.  Diffuse fatty infiltration of liver.  Portions of  the colon wall appear thickened though this potentially an artifact related to  underdistention ; if patient has diarrhea or GI symptoms, consider further evaluation.   Electronically Signed   By: Ulyses Southward M.D.   On: 03/21/2015 04:33       Impression / Plan:   1. Pancreatitis- suspect alcoholic. Unable to calculate Apache score/ranhausen score. He is afebrile, somewhat hypertensive but hemodynamically stable,tolerating liquids well, and pain is improving. There were no signs of necrotizing pancreatitis on CT. WBC is minimally elevated. Agree with electrolyte replacement,  As with below, strongly recommend alcohol cessation/treatment. Would observe for complications of pancreatitis and treat accordingly.has a repeat lipase ordered in the am.  We will follow with you 2. Elevated lfts: suspect alcoholic related as well- will assess for viral hepatitis, as well as pt/inr. Recommend as above, alcohol/substance treatment and avoiding liver harming substances.Noted he has CMP in am to reasses lfts. Recommend psych consult while hospitalized to initiate substance abuse treatment.   Thank you very much for this consult. These services were provided by Vevelyn Pat, NP-C, in collaboration with Christena Deem, MD, with whom I have discussed this patient in full.   Vevelyn Pat, NP-C  Addendum: did discuss with Dr Marva Panda: as pancreatitis is minimal, one other consideration for his elevated lipase is alcoholic gastritis- he is on bid PPI & agree with this. Etoh cessation as above. Will also try to add lipid panel on to admission blood work.

## 2015-03-21 NOTE — ED Notes (Signed)
Pt requesting something to drink specifically apple juice pt states "you gives have the best tasting apple juice"

## 2015-03-21 NOTE — Progress Notes (Signed)
Admit with pancreatitis, ETOH abuse/withdrawal and seizure. Alert and oriented. Noted to have tremor and slight anxiety.A little impulsive. C/o abdominal pain. Lungs with few wheezes. States he has some asthma. Loose stool x1. Ativan and morphine given. Dr Elisabeth Pigeon here and has written transfer order to med/surg.

## 2015-03-21 NOTE — ED Notes (Signed)
Patient transported to CT 

## 2015-03-21 NOTE — Progress Notes (Signed)
Pt c/o intense cigarette cravings and request a inhaler  Spoke with physician  And new order received for nicotrol and and prn med for HTN

## 2015-03-21 NOTE — Plan of Care (Signed)
Problem: Phase I Progression Outcomes Goal: Pain controlled with appropriate interventions Outcome: Progressing Requiring morphine 29m nearly every 2 hrs for abdominal pain an "8" on scale. Goal: OOB as tolerated unless otherwise ordered Outcome: Progressing Up to [facility] x 2. Goal: Initial discharge plan identified Outcome: Progressing SW notified. Pt wants drug/etoh rehab. Goal: Voiding-avoid urinary catheter unless indicated Outcome: Completed/Met Date Met:  03/21/15 Voids without problems good amounts Goal: Hemodynamically stable Outcome: Progressing HR improved. BP remains elevated.  Atenolol and hydralazine given. Denies HA. Goal: Nausea/vomiting controlled after medication Outcome: Progressing No c/o N&V  Problem: Phase II Progression Outcomes Goal: Tolerates PO clear liquids Outcome: Progressing Taking apple juice and few bites jello. Goal: Blood sugar < 150 Outcome: Progressing Blood sugar 100.  Problem: Phase III Progression Outcomes Goal: Absence of DT's (Delirium Tremors) Outcome: Progressing 4-8 on CIWA scale. Tremors noted. Ativan as ordered.

## 2015-03-21 NOTE — Consult Note (Signed)
Patient seen and examined, chart reviewed. See full GI consult by Mrs. London. Agree with recommendations of consult. Would pursue psychiatry consult in regards to substance abuse, continue daily proton pump inhibitor, chronic hepatitis serologies, lipid panel.  We'll follow with you. Again counseled alcohol abstinence.

## 2015-03-21 NOTE — ED Provider Notes (Addendum)
[facility] Emergency Department Provider Note  ____________________________________________  Time seen: Approximately 133 AM  I have reviewed the triage vital signs and the nursing notes.   HISTORY  Chief Complaint Seizures and Alcohol Problem    HPI Johnathan Rivera is a 29 y.o. male who at a seizure this evening. The patient was seen here this afternoon and was diagnosed with pancreatitis and discharged to home at 8 PM. The patient reports that he drinks daily but his last drink was this morning prior to him being seen in the emergency department. The patient drinks for days at a time and drinks lots of wine. EMS ports that the patient had some stiffening activity but could not tell me the duration of the patient's seizure. The patient reports that he has had seizures when he withdraws from alcohol in the past. The patient reports that he is still having some epigastric abdominal pain and nausea. He reports that he was planning on going to a detox program in the morning. The patient denies any fevers denies any chest pain or shortness of breath. The patient has not had any vomiting today. He has also had pancreatitis in the past.The patient reports that his pain is a 9-1/2 out of 10. The patient denies pain radiating to his back.   Past Medical History  Diagnosis Date  . Seizures   . Polysubstance abuse   . Pancreatitis   . Asthma     Patient Active Problem List   Diagnosis Date Noted  . Alcohol abuse 01/04/2015  . Alcohol withdrawal seizure 01/04/2015  . Anxiety 01/04/2015  . Alcoholic hepatitis 01/04/2015  . Alcohol dependence 01/04/2015  . Elevated LFTs   . Polysubstance abuse     History reviewed. No pertinent past surgical history.  Current Outpatient Rx  Name  Route  Sig  Dispense  Refill  . acetaminophen (TYLENOL) 325 MG tablet   Oral   Take 2 tablets (650 mg total) by mouth every 6 (six) hours as needed for mild pain (or Fever >/=  101).   1 tablet   0   . atenolol (TENORMIN) 50 MG tablet   Oral   Take 1 tablet (50 mg total) by mouth daily.   30 tablet   0   . clonazePAM (KLONOPIN) 2 MG tablet   Oral   Take 1 tablet (2 mg total) by mouth 2 (two) times daily.   30 tablet   0   . ondansetron (ZOFRAN) 4 MG tablet      Take 1-2 tabs by mouth every 8 hours as needed for nausea/vomiting   30 tablet   0   . potassium chloride SA (KLOR-CON M20) 20 MEQ tablet   Oral   Take 1 tablet (20 mEq total) by mouth 2 (two) times daily.   14 tablet   0     Allergies Review of patient's allergies indicates no known allergies.  Family History  Problem Relation Age of Onset  . Diabetes Mother   . Liver disease Neg Hx   . Colon cancer Neg Hx     Social History Social History  Substance Use Topics  . Smoking status: Current Every Day Smoker -- 1.00 packs/day    Types: Cigarettes  . Smokeless tobacco: None  . Alcohol Use: 1.2 oz/week    2 Glasses of wine per week    Review of Systems Constitutional: No fever/chills Eyes: No visual changes. ENT: No sore throat. Cardiovascular: Denies chest pain. Respiratory: Denies  shortness of breath. Gastrointestinal: Donald pain, nausea, vomiting Genitourinary: Negative for dysuria. Musculoskeletal: Negative for back pain. Skin: Negative for rash. Neurological: Seizure  10-point ROS otherwise negative.  ____________________________________________   PHYSICAL EXAM:  VITAL SIGNS: ED Triage Vitals  Enc Vitals Group     BP 03/21/15 0150 171/102 mmHg     Pulse Rate 03/21/15 0150 124     Resp 03/21/15 0150 20     Temp 03/21/15 0150 98.3 F (36.8 C)     Temp Source 03/21/15 0150 Oral     SpO2 03/21/15 0150 98 %     Weight --      Height --      Head Cir --      Peak Flow --      Pain Score --      Pain Loc --      Pain Edu? --      Excl. in GC? --     Constitutional: Alert and oriented. Well appearing and in moderate distress. Eyes: Conjunctivae are  normal. PERRL. EOMI. Head: Atraumatic. Nose: No congestion/rhinnorhea. Mouth/Throat: Mucous membranes are moist.  Oropharynx non-erythematous. Cardiovascular: Tachycardic rate regular rhythm. Grossly normal heart sounds.  Good peripheral circulation. Respiratory: Normal respiratory effort.  No retractions. Lungs CTAB. Gastrointestinal: Soft with epigastric tenderness to palpation, positive bowel sounds. Musculoskeletal: No lower extremity tenderness nor edema.   Neurologic:  Normal speech and language. Skin:  Skin is warm, dry and intact.  Psychiatric: Mood and affect are normal.   ____________________________________________   LABS (all labs ordered are listed, but only abnormal results are displayed)  Labs Reviewed  CBC - Abnormal; Notable for the following:    WBC 11.8 (*)    All other components within normal limits  COMPREHENSIVE METABOLIC PANEL - Abnormal; Notable for the following:    Potassium 2.8 (*)    Chloride 100 (*)    Calcium 8.5 (*)    AST 207 (*)    Alkaline Phosphatase 168 (*)    Anion gap 17 (*)    All other components within normal limits  LIPASE, BLOOD - Abnormal; Notable for the following:    Lipase 122 (*)    All other components within normal limits  ETHANOL - Abnormal; Notable for the following:    Alcohol, Ethyl (B) 83 (*)    All other components within normal limits  ACETAMINOPHEN LEVEL - Abnormal; Notable for the following:    Acetaminophen (Tylenol), Serum <10 (*)    All other components within normal limits  MAGNESIUM - Abnormal; Notable for the following:    Magnesium 1.5 (*)    All other components within normal limits  TROPONIN I  SALICYLATE LEVEL   ____________________________________________  EKG  ED ECG REPORT I, Rebecka Apley, the attending physician, personally viewed and interpreted this ECG.   Date: 03/21/2015  EKG Time: 155  Rate: 125  Rhythm: sinus tachycardia  Axis: normal  Intervals:none  ST&T Change:  none  ____________________________________________  RADIOLOGY  CT abdomen and pelvis: Minimal peripancreatic edema compatible with acute pancreatitis, recommend correlation with serum lipase/amylase, diffuse fatty infiltration liver, portions of the colon wall appears thickened though this potentially an artifact related to under distention. ____________________________________________   PROCEDURES  Procedure(s) performed: None  Critical Care performed: Yes, see critical care note(s)  CRITICAL CARE Performed by: Lucrezia Europe P   Total critical care time:  Critical care time was exclusive of separately billable procedures and treating other patients.  Critical care  was necessary to treat or prevent imminent or life-threatening deterioration.  Critical care was time spent personally by me on the following activities: development of treatment plan with patient and/or surrogate as well as nursing, discussions with consultants, evaluation of patient's response to treatment, examination of patient, obtaining history from patient or surrogate, ordering and performing treatments and interventions, ordering and review of laboratory studies, ordering and review of radiographic studies, pulse oximetry and re-evaluation of patient's condition.  ____________________________________________   INITIAL IMPRESSION / ASSESSMENT AND PLAN / ED COURSE  Pertinent labs & imaging results that were available during my care of the patient were reviewed by me and considered in my medical decision making (see chart for details).  This is a 29 year old male who comes in with a seizure as well as tachycardia nausea and vomiting when he arrived to the emergency department. The patient's initial blood pressure was in the 140s but as he was here his blood pressure continued to rise. The patient is also significantly tachycardic. I feel the patient is in alcohol withdrawal I gave the patient 2 L of  normal saline as well as 2 doses of Ativan 2 mg the patient continued to have tachycardia as well as hypertension. I did give the patient some Zofran for nausea as well as magnesium and potassium. Given the patient's pancreatitis, his vomiting as well as his tachycardia I will admit him to the hospital for further evaluation and treatment of his alcohol withdrawal and his pancreatitis. The patient is awake and alert without any confusion. ____________________________________________   FINAL CLINICAL IMPRESSION(S) / ED DIAGNOSES  Final diagnoses:  Alcohol withdrawal seizure, uncomplicated  Alcohol-induced acute pancreatitis  Tachycardia      Rebecka Apley, MD 03/21/15 0451  Rebecka Apley, MD 03/21/15 [phone]

## 2015-03-22 LAB — CBC
HCT: 38.6 % — ABNORMAL LOW (ref 40.0–52.0)
HEMOGLOBIN: 13.8 g/dL (ref 13.0–18.0)
MCH: 34.5 pg — AB (ref 26.0–34.0)
MCHC: 35.8 g/dL (ref 32.0–36.0)
MCV: 96.6 fL (ref 80.0–100.0)
PLATELETS: 190 10*3/uL (ref 150–440)
RBC: 3.99 MIL/uL — ABNORMAL LOW (ref 4.40–5.90)
RDW: 13.6 % (ref 11.5–14.5)
WBC: 8.2 10*3/uL (ref 3.8–10.6)

## 2015-03-22 LAB — COMPREHENSIVE METABOLIC PANEL
ALBUMIN: 3.4 g/dL — AB (ref 3.5–5.0)
ALK PHOS: 165 U/L — AB (ref 38–126)
ALT: 30 U/L (ref 17–63)
ANION GAP: 7 (ref 5–15)
AST: 157 U/L — AB (ref 15–41)
BILIRUBIN TOTAL: 1 mg/dL (ref 0.3–1.2)
CALCIUM: 8.6 mg/dL — AB (ref 8.9–10.3)
CO2: 31 mmol/L (ref 22–32)
CREATININE: 0.65 mg/dL (ref 0.61–1.24)
Chloride: 100 mmol/L — ABNORMAL LOW (ref 101–111)
GFR calc Af Amer: >60 mL/min (ref >60)
GFR calc non Af Amer: >60 mL/min (ref >60)
GLUCOSE: 94 mg/dL (ref 65–99)
Potassium: 3.2 mmol/L — ABNORMAL LOW (ref 3.5–5.1)
SODIUM: 138 mmol/L (ref 135–145)
TOTAL PROTEIN: 6.9 g/dL (ref 6.5–8.1)

## 2015-03-22 LAB — HEPATITIS C ANTIBODY (REFLEX): HCV Ab: <0.1 s/co ratio (ref 0.0–0.9)

## 2015-03-22 LAB — HCV COMMENT:

## 2015-03-22 LAB — HEPATITIS B SURFACE ANTIGEN: Hepatitis B Surface Ag: NEGATIVE

## 2015-03-22 LAB — HEPATITIS B SURFACE ANTIBODY, QUANTITATIVE: Hepatitis B-Post: 25.6 m[IU]/mL (ref >9.9)

## 2015-03-22 LAB — MRSA PCR SCREENING: MRSA by PCR: NEGATIVE

## 2015-03-22 LAB — HEPATITIS B CORE ANTIBODY, TOTAL: HEP B C TOTAL AB: NEGATIVE

## 2015-03-22 LAB — LIPASE, BLOOD: LIPASE: 123 U/L — AB (ref 22–51)

## 2015-03-22 LAB — HEPATITIS A ANTIBODY, TOTAL: Hep A Total Ab: NEGATIVE

## 2015-03-22 MED ORDER — POTASSIUM CHLORIDE CRYS ER 20 MEQ PO TBCR
40.0000 meq | EXTENDED_RELEASE_TABLET | Freq: Two times a day (BID) | ORAL | Status: DC
  Administered 2015-03-22 – 2015-03-23 (×3): 40 meq via ORAL
  Filled 2015-03-22 (×3): qty 2

## 2015-03-22 MED ORDER — PANTOPRAZOLE SODIUM 40 MG PO TBEC
40.0000 mg | DELAYED_RELEASE_TABLET | Freq: Two times a day (BID) | ORAL | Status: DC
  Administered 2015-03-22 – 2015-03-23 (×2): 40 mg via ORAL
  Filled 2015-03-22 (×2): qty 1

## 2015-03-22 NOTE — Progress Notes (Signed)
Pt was insistent that he could handle more than liquid diet. Soft diet was ordered.  After eating his soft diet. He experienced severe abdominal pain and N&V. Morphine and zofran were given.

## 2015-03-22 NOTE — Plan of Care (Signed)
Problem: Phase I Progression Outcomes Goal: Pain controlled with appropriate interventions Outcome: Progressing Pain has worsened with increase in po intake.  Morphine every 2-4 hrs. Goal: OOB as tolerated unless otherwise ordered Outcome: Progressing Up to [facility] without difficulty Goal: Initial discharge plan identified Outcome: Progressing Plans to go to alcohol/drug rehab at discharge Goal: Hemodynamically stable Outcome: Progressing BP remains elevated but is improving. Atenolol and hydralazine scheduled. Goal: Nausea/vomiting controlled after medication Outcome: Progressing N&V and worsened pain immediately after taking in soft diet.  Back to sips of clear liquids.  Problem: Phase II Progression Outcomes Goal: Tolerates PO clear liquids Outcome: Progressing Now on clear liquids. Some increase in pain post clear liquid diet at supper.

## 2015-03-22 NOTE — Consult Note (Signed)
  Psychiatry: Consult received. Chart reviewed. Patient will be seen tomorrow when hopefully he will be more comfortable and able to talk more. No indication for acute psychiatric intervention at this point. We can discuss further treatment tomorrow.

## 2015-03-22 NOTE — Care Management Note (Signed)
Case Management Note  Patient Details  Name: Johnathan Rivera MRN: [id number] Date of Birth: [birth year]  Subjective/Objective:   Admitted with acute pancreatitis and seizures. CIWA 2-3. Not tolerating a soft diet this am. It is reported that patient wants rehab s/p discharge. CSW consult in. Patient has no identified home health needs and he has no insurance.                   Action/Plan: Monitor progression and acute needs.   Expected Discharge Date:                  Expected Discharge Plan:  Home/Self Care  In-House Referral:  Clinical Social Work  Discharge planning Services  CM Consult  Post Acute Care Choice:    Choice offered to:     DME Arranged:    DME Agency:     HH Arranged:    HH Agency:     Status of Service:  In process, will continue to follow  Medicare Important Message Given:    Date Medicare IM Given:    Medicare IM give by:    Date Additional Medicare IM Given:    Additional Medicare Important Message give by:     If discussed at Long Length of Stay Meetings, dates discussed:    Additional Comments:  Marily Memos, RN 03/22/2015, 11:16 AM

## 2015-03-22 NOTE — Progress Notes (Signed)
Called Dr. Betti Cruz to address elevated blood pressures for patient. Labetolol was given at 0254, with no effect. Verbal order received to administer  of Labetolol STAT.

## 2015-03-22 NOTE — Consult Note (Signed)
Subjective: Patient seen for recurrent pancreatitis in the setting of alcohol abuse. Patient still has epigastric abdominal pain however he states he is passing flatus. This seemed to get worse when he tried to eat earlier today. His was actually just a clear liquid/full liquid type meal.  Objective: Vital signs in last 24 hours: Temp:  [97.8 F (36.6 C)-98.3 F (36.8 C)] 97.8 F (36.6 C) (09/21 1300) Pulse Rate:  [80-98] 90 (09/21 1500) Resp:  [9-21] 15 (09/21 1500) BP: (132-182)/(102-141) 132/107 mmHg (09/21 1500) SpO2:  [97 %-100 %] 99 % (09/21 1500) Weight:  [72.7 kg (160 lb 4.4 oz)] 72.7 kg (160 lb 4.4 oz) (09/21 0458) Blood pressure 132/107, pulse 90, temperature 97.8 F (36.6 C), temperature source Oral, resp. rate 15, height 6' (1.829 m), weight 72.7 kg (160 lb 4.4 oz), SpO2 99 %.   Intake/Output from previous day: 09/20 0701 - 09/21 0700 In: 2681.3 [P.O.:600; I.V.:1981.3; IV Piggyback:100] Out: 3976 [Urine:3975; Stool:1]  Intake/Output this shift: Total I/O In: 620 [P.O.:620] Out: 1352 [Urine:1350; Emesis/NG output:1; Stool:1]   General appearance:  29 year old male flat affect no acute distress Resp:  Clear to auscultation Cardio:  Regular rate and rhythm without rub or gallop GI:  Mild tenderness to palpation in the epigastric region, no masses rebound or organomegaly. Extremities:  No clubbing cyanosis or edema   Lab Results: Results for orders placed or performed during the hospital encounter of 03/21/15 (from the past 24 hour(s))  Comprehensive metabolic panel     Status: Abnormal   Collection Time: 03/22/15  4:07 AM  Result Value Ref Range   Sodium 138 135 - 145 mmol/L   Potassium 3.2 (L) 3.5 - 5.1 mmol/L   Chloride 100 (L) 101 - 111 mmol/L   CO2 31 22 - 32 mmol/L   Glucose, Bld 94 65 - 99 mg/dL   BUN <5 (L) 6 - 20 mg/dL   Creatinine, Ser 0.98 0.61 - 1.24 mg/dL   Calcium 8.6 (L) 8.9 - 10.3 mg/dL   Total Protein 6.9 6.5 - 8.1 g/dL   Albumin 3.4 (L) 3.5 -  5.0 g/dL   AST 119 (H) 15 - 41 U/L   ALT 30 17 - 63 U/L   Alkaline Phosphatase 165 (H) 38 - 126 U/L   Total Bilirubin 1.0 0.3 - 1.2 mg/dL   GFR calc non Af Amer >60 >60 mL/min   GFR calc Af Amer >60 >60 mL/min   Anion gap 7 5 - 15  CBC     Status: Abnormal   Collection Time: 03/22/15  4:07 AM  Result Value Ref Range   WBC 8.2 3.8 - 10.6 K/uL   RBC 3.99 (L) 4.40 - 5.90 MIL/uL   Hemoglobin 13.8 13.0 - 18.0 g/dL   HCT 14.7 (L) 82.9 - 56.2 %   MCV 96.6 80.0 - 100.0 fL   MCH 34.5 (H) 26.0 - 34.0 pg   MCHC 35.8 32.0 - 36.0 g/dL   RDW 13.0 86.5 - 78.4 %   Platelets 190 150 - 440 K/uL  Lipase, blood     Status: Abnormal   Collection Time: 03/22/15  4:07 AM  Result Value Ref Range   Lipase 123 (H) 22 - 51 U/L      Recent Labs  03/20/15 1653 03/21/15 0145 03/22/15 0407  WBC 8.9 11.8* 8.2  HGB 16.3 14.8 13.8  HCT 47.5 43.1 38.6*  PLT 285 271 190   BMET  Recent Labs  03/20/15 1653 03/21/15 0145 03/22/15 0407  NA 141 141 138  K 2.8* 2.8* 3.2*  CL 100* 100* 100*  CO2 GLUCOSE 124* 90 94  BUN <5* 7 <5*  CREATININE 0.71 0.72 0.65  CALCIUM 8.8* 8.5* 8.6*   LFT  Recent Labs  03/22/15 0407  PROT 6.9  ALBUMIN 3.4*  AST 157*  ALT 30  ALKPHOS 165*  BILITOT 1.0   PT/INR  Recent Labs  03/21/15 1527  LABPROT 13.5  INR 1.01   Hepatitis Panel  Recent Labs  03/21/15 1527  HEPBSAG Negative   C-Diff No results for input(s): CDIFFTOX in the last 72 hours. No results for input(s): CDIFFPCR in the last 72 hours.   Studies/Results: Ct Abdomen Pelvis W Contrast  03/21/2015   CLINICAL DATA:  Seizure, chronic alcohol use with last ethanol intake 03/20/2015, nausea without vomiting, mid abdominal pain 9.5/10, prior pancreatitis, asthma  EXAM: CT ABDOMEN AND PELVIS WITH CONTRAST  TECHNIQUE: Multidetector CT imaging of the abdomen and pelvis was performed using the standard protocol following bolus administration of intravenous contrast. Sagittal and coronal  MPR images reconstructed from axial data set.  CONTRAST:  OMNIPAQUE IOHEXOL 300 MG/ML SOLN IV. Dilute oral contrast.  COMPARISON:  None  FINDINGS: Lung bases clear.  Diffuse fatty infiltration of liver.  Minimal peripancreatic infiltrative changes compatible with acute pancreatitis.  No pancreatitis associated fluid collections or venous occlusion identified.  No pancreatic calcifications, mass, or ductal dilatation seen.  Liver, spleen, kidneys, and adrenal glands unremarkable.  Stomach and small bowel loops normal appearance.  Colon wall appears diffusely slightly prominent though the colon is unopacified and under distended, potentially artifact.  Bladder and ureters unremarkable.  No mass, adenopathy, free air or free fluid.  Osseous structures unremarkable.  IMPRESSION: Minimal peripancreatic edema compatible with acute pancreatitis, recommend correlation with serum lipase/amylase.  Diffuse fatty infiltration of liver.  Portions of the colon wall appear thickened though this potentially an artifact related to underdistention ; if patient has diarrhea or GI symptoms, consider further evaluation.   Electronically Signed   By: Ulyses Southward M.D.   On: 03/21/2015 04:33    Scheduled Inpatient Medications:   . atenolol  50 mg Oral Daily  . clonazePAM  2 mg Oral BID  . enoxaparin (LOVENOX) injection  40 mg Subcutaneous Q24H  . folic acid  1 mg Oral Daily  . hydrALAZINE  25 mg Oral 3 times per day  . Influenza vac split quadrivalent PF  0.5 mL Intramuscular Tomorrow-1000  . multivitamin with minerals  1 tablet Oral Daily  . pantoprazole  40 mg Oral BID  . potassium chloride  40 mEq Oral BID  . sodium chloride  3 mL Intravenous Q12H  . thiamine  100 mg Oral Daily   Or  . thiamine  100 mg Intravenous Daily    Continuous Inpatient Infusions:     PRN Inpatient Medications:  labetalol, LORazepam **OR** LORazepam, morphine injection, nicotine, ondansetron **OR** ondansetron (ZOFRAN)  IV  Miscellaneous:   Assessment:  1. Pancreatitis, likely alcohol-related. Labs ordered to check lipid levels on admission.  Plan:  1. Continue current. Would advise slow increase of diet and would not increase diet until pain medication need is decreased. Following.  Christena Deem MD 03/22/2015, 4:45 PM

## 2015-03-22 NOTE — Progress Notes (Signed)
[facility] [place] at [facility]   PATIENT NAME: Johnathan Rivera    MR#:  [id number]  DATE OF BIRTH:  [birth year]  SUBJECTIVE:  CHIEF COMPLAINT:   Chief Complaint  Patient presents with  . Seizures  . Alcohol Problem   came with alcohol withdrawal seizures. Have abdominal pain and pancreatitis for 2 days.   Pain is under control with meds now.   Wanted to try soft food today, vomited with that. So now again on liquids. REVIEW OF SYSTEMS:  CONSTITUTIONAL: No fever, fatigue or weakness.  EYES: No blurred or double vision.  EARS, NOSE, AND THROAT: No tinnitus or ear pain.  RESPIRATORY: No cough, shortness of breath, wheezing or hemoptysis.  CARDIOVASCULAR: No chest pain, orthopnea, edema.  GASTROINTESTINAL: No nausea, vomiting, diarrhea , mild abdominal pain.  GENITOURINARY: No dysuria, hematuria.  ENDOCRINE: No polyuria, nocturia,  HEMATOLOGY: No anemia, easy bruising or bleeding SKIN: No rash or lesion. MUSCULOSKELETAL: No joint pain or arthritis.   NEUROLOGIC: No tingling, numbness, weakness.  PSYCHIATRY: No anxiety or depression.   ROS  DRUG ALLERGIES:  No Known Allergies  VITALS:  Blood pressure 144/106, pulse 94, temperature 97.8 F (36.6 C), temperature source Oral, resp. rate 13, height 6' (1.829 m), weight 72.7 kg (160 lb 4.4 oz), SpO2 98 %.  PHYSICAL EXAMINATION:  GENERAL:  29 y.o.-year-old patient lying in the bed with no acute distress.  EYES: Pupils equal, round, reactive to light and accommodation. No scleral icterus. Extraocular muscles intact.  HEENT: Head atraumatic, normocephalic. Oropharynx and nasopharynx clear.  NECK:  Supple, no jugular venous distention. No thyroid enlargement, no tenderness.  LUNGS: Normal breath sounds bilaterally, no wheezing, rales,rhonchi or crepitation. No use of accessory muscles of respiration.  CARDIOVASCULAR: S1, S2 normal. No murmurs, rubs, or gallops.  ABDOMEN: Soft, mild epigastric tender,  nondistended. Bowel sounds present. No organomegaly or mass.  EXTREMITIES: No pedal edema, cyanosis, or clubbing.  NEUROLOGIC: Cranial nerves II through XII are intact. Muscle strength 5/5 in all extremities. Sensation intact. Gait not checked.  PSYCHIATRIC: The patient is alert and oriented x 3.  SKIN: No obvious rash, lesion, or ulcer.   Physical Exam LABORATORY PANEL:   CBC  Recent Labs Lab 03/22/15 0407  WBC 8.2  HGB 13.8  HCT 38.6*  PLT 190   ------------------------------------------------------------------------------------------------------------------  Chemistries   Recent Labs Lab 03/21/15 0145 03/22/15 0407  NA 141 138  K 2.8* 3.2*  CL 100* 100*  CO2 24 31  GLUCOSE 90 94  BUN 7 <5*  CREATININE 0.72 0.65  CALCIUM 8.5* 8.6*  MG 1.5*  --   AST 207* 157*  ALT 32 30  ALKPHOS 168* 165*  BILITOT 0.9 1.0   ------------------------------------------------------------------------------------------------------------------  Cardiac Enzymes  Recent Labs Lab 03/21/15 0145  TROPONINI <0.03   ------------------------------------------------------------------------------------------------------------------  RADIOLOGY:  Ct Abdomen Pelvis W Contrast  03/21/2015   CLINICAL DATA:  Seizure, chronic alcohol use with last ethanol intake 03/20/2015, nausea without vomiting, mid abdominal pain 9.5/10, prior pancreatitis, asthma  EXAM: CT ABDOMEN AND PELVIS WITH CONTRAST  TECHNIQUE: Multidetector CT imaging of the abdomen and pelvis was performed using the standard protocol following bolus administration of intravenous contrast. Sagittal and coronal MPR images reconstructed from axial data set.  CONTRAST:  OMNIPAQUE IOHEXOL 300 MG/ML SOLN IV. Dilute oral contrast.  COMPARISON:  None  FINDINGS: Lung bases clear.  Diffuse fatty infiltration of liver.  Minimal peripancreatic infiltrative changes compatible with acute pancreatitis.  No pancreatitis associated fluid collections  or venous occlusion identified.  No pancreatic calcifications, mass, or ductal dilatation seen.  Liver, spleen, kidneys, and adrenal glands unremarkable.  Stomach and small bowel loops normal appearance.  Colon wall appears diffusely slightly prominent though the colon is unopacified and under distended, potentially artifact.  Bladder and ureters unremarkable.  No mass, adenopathy, free air or free fluid.  Osseous structures unremarkable.  IMPRESSION: Minimal peripancreatic edema compatible with acute pancreatitis, recommend correlation with serum lipase/amylase.  Diffuse fatty infiltration of liver.  Portions of the colon wall appear thickened though this potentially an artifact related to underdistention ; if patient has diarrhea or GI symptoms, consider further evaluation.   Electronically Signed   By: Ulyses Southward M.D.   On: 03/21/2015 04:33    ASSESSMENT AND PLAN:   * Alcohol withdrawal seizures   On Ativan and CIWA protocol.    Stable now.    Want to go to alcohol rehab, called psychiatry.  * pancreatitis- alcohol induced.   IV fluids, SUpportive care.   Tolerated clear liquids, but had vomit with soft diet.   Keep on liquid diet today.  * hypokalemia and hypomagnesemia    Replace.  * alcoholic hepatitis   Monitor LFT   Gi on case.   All the records are reviewed and case discussed with Care Management/Social Workerr. Management plans discussed with the patient, family and they are in agreement.  CODE STATUS: full  TOTAL TIME TAKING CARE OF THIS PATIENT: 35 minutes.    POSSIBLE D/C IN 1-2 DAYS, DEPENDING ON CLINICAL CONDITION.   Altamese Dilling M.D on 03/22/2015   Between 7am to 6pm - Pager - [phone]  After 6pm go to [email or web address] - password [email or web address] [facility]  [facility]  Office  [phone]  CC: Primary care physician; Evelene Croon, MD  Note: This dictation was prepared with Dragon dictation along with smaller phrase technology. Any  transcriptional errors that result from this process are unintentional.

## 2015-03-22 NOTE — Progress Notes (Signed)
Ready to transfer to room 237. Last pain med at 1900. Mother aware of transfer.

## 2015-03-22 NOTE — Progress Notes (Signed)
   03/22/15 1500  Clinical Encounter Type  Visited With Patient and family together  Visit Type Initial  Referral From Nurse  Consult/Referral To Chaplain  Spiritual Encounters  Spiritual Needs Emotional  Stress Factors  Patient Stress Factors Health changes  Family Stress Factors None identified  Chaplain offered support as applicable. Patient appeared to be feeling nauseous so I inquired with family and family confirmed. Contacted the nurse and she attended to patient. Chaplain Nyela Cortinas A. Elesha Thedford Ext. [phone]

## 2015-03-22 NOTE — Progress Notes (Signed)
29 y/o M with seizures and chronic pancreatitis on pantoprazole 40 mg iv q 12 hours. Patient is tolerating oral medications and diet so will transition to pantoprazole 40 mg po bid as per policy.

## 2015-03-23 MED ORDER — ADULT MULTIVITAMIN W/MINERALS CH: 1.0000 | ORAL_TABLET | Freq: Every day | ORAL | Status: AC

## 2015-03-23 MED ORDER — HYDRALAZINE HCL 25 MG PO TABS: 25.0000 mg | ORAL_TABLET | Freq: Three times a day (TID) | ORAL | Status: AC

## 2015-03-23 MED ORDER — PANTOPRAZOLE SODIUM 40 MG PO TBEC: 40.0000 mg | DELAYED_RELEASE_TABLET | Freq: Two times a day (BID) | ORAL | Status: DC

## 2015-03-23 MED ORDER — OXYCODONE-ACETAMINOPHEN 5-325 MG PO TABS: 1.0000 | ORAL_TABLET | ORAL | Status: DC | PRN

## 2015-03-23 MED ORDER — FOLIC ACID 1 MG PO TABS: 1.0000 mg | ORAL_TABLET | Freq: Every day | ORAL | Status: DC

## 2015-03-23 MED ORDER — OXYCODONE-ACETAMINOPHEN 5-325 MG PO TABS
1.0000 | ORAL_TABLET | ORAL | Status: DC | PRN
  Administered 2015-03-23: 1 via ORAL
  Filled 2015-03-23: qty 1

## 2015-03-23 NOTE — Progress Notes (Signed)
P  t to be discharged to home today. Iv and tele removed. disch instructions and prescrips given to pt to is understanding. Discharged via w.c. Accompanied by his mother.

## 2015-03-23 NOTE — Discharge Instructions (Signed)
Follow with PMD in 1-2 weeks. °
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
## 2015-03-23 NOTE — Progress Notes (Signed)
Pt complaining of pain and insomnia.  Pt seems a little more anxious this morning, discussing how much he would like to go home today.  Pt's BP was still up- diastolic.  PRN ativan administered, pt agreeable.  Will continue to monitor. Johnathan Rivera

## 2015-03-23 NOTE — Discharge Summary (Signed)
[facility] [place] [facility]   PATIENT NAME: Johnathan Rivera    MR#:  [id number]  DATE OF BIRTH:  Sep 18, 1985  DATE OF ADMISSION:  03/21/2015 ADMITTING PHYSICIAN: Crissie Figures, MD  DATE OF DISCHARGE: 03/23/2015  PRIMARY CARE PHYSICIAN: Evelene Croon, MD    ADMISSION DIAGNOSIS:  Tachycardia [R00.0] Alcohol-induced acute pancreatitis [K85.2] Alcohol withdrawal seizure, uncomplicated [F10.230]  DISCHARGE DIAGNOSIS:  Principal Problem:   Alcohol withdrawal seizure Active Problems:   Alcoholic hepatitis   Acute alcoholic pancreatitis   Hypokalemia   Hypomagnesemia   SECONDARY DIAGNOSIS:   Past Medical History  Diagnosis Date  . Seizures   . Polysubstance abuse   . Pancreatitis   . Asthma   . Alcohol dependence     HOSPITAL COURSE:   * Alcohol withdrawal seizures  On Ativan and CIWA protocol.  Stable now.  Want to go to alcohol rehab, called psychiatry.    Case manager gave list of rehab programs.  * pancreatitis- alcohol induced.  IV fluids, Supportive care.  Tolerated clear liquids, vomited yesterday, but today tolerated soft diet.  discharge home on oral pain meds.  * hypokalemia and hypomagnesemia  Replace.  * alcoholic hepatitis  Monitor LFT  Gi on case.   Advised to follow in office in 2-3 weeks.  DISCHARGE CONDITIONS:   Stable.  CONSULTS OBTAINED:  Treatment Team:  Christena Deem, MD Audery Amel, MD  DRUG ALLERGIES:  No Known Allergies  DISCHARGE MEDICATIONS:   Current Discharge Medication List    START taking these medications   Details  folic acid (FOLVITE) 1 MG tablet Take 1 tablet (1 mg total) by mouth daily. Qty: 30 tablet, Refills: 0    hydrALAZINE (APRESOLINE) 25 MG tablet Take 1 tablet (25 mg total) by mouth 3 (three) times daily. Qty: 90 tablet, Refills: 0    Multiple Vitamin (MULTIVITAMIN WITH MINERALS) TABS tablet Take 1 tablet by mouth daily. Qty: 30 tablet,  Refills: 0    oxyCODONE-acetaminophen (PERCOCET/ROXICET) 5-325 MG per tablet Take 1 tablet by mouth every 4 (four) hours as needed for moderate pain. Qty: 20 tablet, Refills: 0    pantoprazole (PROTONIX) 40 MG tablet Take 1 tablet (40 mg total) by mouth 2 (two) times daily. Qty: 60 tablet, Refills: 0      CONTINUE these medications which have NOT CHANGED   Details  acetaminophen (TYLENOL) 325 MG tablet Take 2 tablets (650 mg total) by mouth every 6 (six) hours as needed for mild pain (or Fever >/= 101). Qty: 1 tablet, Refills: 0    atenolol (TENORMIN) 50 MG tablet Take 1 tablet (50 mg total) by mouth daily. Qty: 30 tablet, Refills: 0    clonazePAM (KLONOPIN) 2 MG tablet Take 1 tablet (2 mg total) by mouth 2 (two) times daily. Qty: 30 tablet, Refills: 0    ondansetron (ZOFRAN) 4 MG tablet Take 1-2 tabs by mouth every 8 hours as needed for nausea/vomiting Qty: 30 tablet, Refills: 0    potassium chloride SA (KLOR-CON M20) 20 MEQ tablet Take 1 tablet (20 mEq total) by mouth 2 (two) times daily. Qty: 14 tablet, Refills: 0         DISCHARGE INSTRUCTIONS:    Follow with GI in office 2 weeks.  If you experience worsening of your admission symptoms, develop shortness of breath, life threatening emergency, suicidal or homicidal thoughts you must seek medical attention immediately by calling 911 or calling your MD immediately  if symptoms less severe.  You Must read complete instructions/literature along with all the possible adverse reactions/side effects for all the Medicines you take and that have been prescribed to you. Take any new Medicines after you have completely understood and accept all the possible adverse reactions/side effects.   Please note  You were cared for by a hospitalist during your hospital stay. If you have any questions about your discharge medications or the care you received while you were in the hospital after you are discharged, you can call the unit and  asked to speak with the hospitalist on call if the hospitalist that took care of you is not available. Once you are discharged, your primary care physician will handle any further medical issues. Please note that NO REFILLS for any discharge medications will be authorized once you are discharged, as it is imperative that you return to your primary care physician (or establish a relationship with a primary care physician if you do not have one) for your aftercare needs so that they can reassess your need for medications and monitor your lab values.    Today   CHIEF COMPLAINT:   Chief Complaint  Patient presents with  . Seizures  . Alcohol Problem    HISTORY OF PRESENT ILLNESS:  Johnathan Rivera  is a 29 y.o. male with a known history of alcohol dependence, chronic pancreatitis and alcoholic hepatitis presents to the emergency room with the complaints of seizure episode. Patient was seen in the ED earlier yesterday for ongoing abdominal pain with nausea and diagnosed to have pancreatitis, was sent home with a referral to undergo detox. Patient developed seizure episode hence brought to the emergency room for further evaluation later in the night. 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 Patient continues to have ongoing epigastric pain with some nausea but denies any vomitings. Patient was evaluated by the ED physician and noted to have fluctuating blood pressures related to alcohol withdrawals. Workup revealed WC of 11.8, potassium 2.8, magnesium 1.5, lipase 122, alkaline phosphatase 168, AST/ALT 207/32, troponin less than 0.03, EtOH level 83. EKG with sinus tachycardia with ventricular rate of 1 25 bpm. CT of the abdomen and the pelvis minimal peripancreatic edema compatible with acute pancreatitis and diffuse fatty liver. Patient was placed on CIWA protocol, was given Ativan for agitation. Hospitalist service was consulted for further management. Patient denies any history of fever or  chills, states still has nausea and abdominal pain but denies any vomitings. No chest pain or shortness of breath or diarrhea or dysuria.   VITAL SIGNS:  Blood pressure 138/95, pulse 86, temperature 98.1 F (36.7 C), temperature source Oral, resp. rate 18, height 6' (1.829 m), weight 71.396 kg (157 lb 6.4 oz), SpO2 100 %.  I/O:   Intake/Output Summary (Last 24 hours) at 03/23/15 1525 Last data filed at 03/23/15 0830  Gross per 24 hour  Intake      0 ml  Output    600 ml  Net   -600 ml    PHYSICAL EXAMINATION:  GENERAL: 29 y.o.-year-old patient lying in the bed with no acute distress.  EYES: Pupils equal, round, reactive to light and accommodation. No scleral icterus. Extraocular muscles intact.  HEENT: Head atraumatic, normocephalic. Oropharynx and nasopharynx clear.  NECK: Supple, no jugular venous distention. No thyroid enlargement, no tenderness.  LUNGS: Normal breath sounds bilaterally, no wheezing, rales,rhonchi or crepitation. No use of accessory muscles of respiration.  CARDIOVASCULAR: S1, S2 normal. No murmurs, rubs, or gallops.  ABDOMEN: Soft, mild epigastric  tender, nondistended. Bowel sounds present. No organomegaly or mass.  EXTREMITIES: No pedal edema, cyanosis, or clubbing.  NEUROLOGIC: Cranial nerves II through XII are intact. Muscle strength 5/5 in all extremities. Sensation intact. Gait not checked.  PSYCHIATRIC: The patient is alert and oriented x 3.  SKIN: No obvious rash, lesion, or ulcer.  DATA REVIEW:   CBC  Recent Labs Lab 03/22/15 0407  WBC 8.2  HGB 13.8  HCT 38.6*  PLT 190    Chemistries   Recent Labs Lab 03/21/15 0145 03/22/15 0407  NA 141 138  K 2.8* 3.2*  CL 100* 100*  CO2 24 31  GLUCOSE 90 94  BUN 7 <5*  CREATININE 0.72 0.65  CALCIUM 8.5* 8.6*  MG 1.5*  --   AST 207* 157*  ALT 32 30  ALKPHOS 168* 165*  BILITOT 0.9 1.0    Cardiac Enzymes  Recent Labs Lab 03/21/15 0145  TROPONINI <0.03    Microbiology  Results  Results for orders placed or performed during the hospital encounter of 03/21/15  MRSA PCR Screening     Status: None   Collection Time: 03/21/15  9:08 AM  Result Value Ref Range Status   MRSA by PCR NEGATIVE NEGATIVE Final    Comment:        The GeneXpert MRSA Assay (FDA approved for NASAL specimens only), is one component of a comprehensive MRSA colonization surveillance program. It is not intended to diagnose MRSA infection nor to guide or monitor treatment for MRSA infections.     RADIOLOGY:  No results found.  EKG:   Orders placed or performed during the hospital encounter of 03/21/15  . ED EKG  . ED EKG  . EKG 12-Lead  . EKG 12-Lead      Management plans discussed with the patient, family and they are in agreement.  CODE STATUS:     Code Status Orders        Start     Ordered   03/21/15 0908  Full code   Continuous     03/21/15 0907      TOTAL TIME TAKING CARE OF THIS PATIENT: 35 minutes.    Altamese Dilling M.D on 03/23/2015 at 3:25 PM  Between 7am to 6pm - Pager - [phone]  After 6pm go to [email or web address] - password [email or web address] [facility]  [facility]  Office  [phone]  CC: Primary care physician; Evelene Croon, MD   Note: This dictation was prepared with Dragon dictation along with smaller phrase technology. Any transcriptional errors that result from this process are unintentional.

## 2015-03-23 NOTE — Consult Note (Signed)
  Came to see patient for consult but he has already been discharged.

## 2015-03-23 NOTE — Care Management (Signed)
Admitted with abd pain alcohol withdrawal and possible seizure related to the withdrawal. Patient without payor source and "in between jobs."  He admits to problems with etoh and says he is serious about getting help.  CSW will assess and provide resources.  Cm will assess meds in the event patient requires assist.  He lives with his mother who provides support.  Psych will assess today (attempted 9/21 but patient in a lot of pain) and GI consult completed.

## 2015-03-23 NOTE — Consult Note (Signed)
Subjective: Patient seen for pancreatitis. Medically patient appears much better today. As much less abdominal discomfort and he tolerated a full liquid meal this morning. There is no abdominal distention and he is been passing stool and flatus. No nausea or vomiting.  Objective: Vital signs in last 24 hours: Temp:  [97.8 F (36.6 C)-98.8 F (37.1 C)] 98.8 F (37.1 C) (09/22 0800) Pulse Rate:  [75-94] 76 (09/22 0800) Resp:  [10-18] 18 (09/22 0008) BP: (132-154)/(86-116) 138/102 mmHg (09/22 0800) SpO2:  [96 %-100 %] 100 % (09/22 0428) Weight:  [71.351 kg (157 lb 4.8 oz)-71.396 kg (157 lb 6.4 oz)] 71.396 kg (157 lb 6.4 oz) (09/22 0605) Blood pressure 138/102, pulse 76, temperature 98.8 F (37.1 C), temperature source Oral, resp. rate 18, height 6' (1.829 m), weight 71.396 kg (157 lb 6.4 oz), SpO2 100 %.   Intake/Output from previous day: 09/21 0701 - 09/22 0700 In: 620 [P.O.:620] Out: 1952 [Urine:1950; Emesis/NG output:1; Stool:1]  Intake/Output this shift:     General appearance:  A 29 year old male no acute distress Resp:  Clear to auscultation Cardio:  Regular rate and rhythm GI:  Soft mild discomfort palpation the epigastric region none in the left upper quadrant. No rebound. Bowel sounds positive normoactive Extremities:  No clubbing cyanosis or edema   Lab Results: No results found for this or any previous visit (from the past 24 hour(s)).    Recent Labs  03/20/15 1653 03/21/15 0145 03/22/15 0407  WBC 8.9 11.8* 8.2  HGB 16.3 14.8 13.8  HCT 47.5 43.1 38.6*  PLT 285 271 190   BMET  Recent Labs  03/20/15 1653 03/21/15 0145 03/22/15 0407  NA 141 141 138  K 2.8* 2.8* 3.2*  CL 100* 100* 100*  CO2 GLUCOSE 124* 90 94  BUN <5* 7 <5*  CREATININE 0.71 0.72 0.65  CALCIUM 8.8* 8.5* 8.6*   LFT  Recent Labs  03/22/15 0407  PROT 6.9  ALBUMIN 3.4*  AST 157*  ALT 30  ALKPHOS 165*  BILITOT 1.0   PT/INR  Recent Labs  03/21/15 1527  LABPROT  13.5  INR 1.01   Hepatitis Panel  Recent Labs  03/21/15 1527  HEPBSAG Negative   C-Diff No results for input(s): CDIFFTOX in the last 72 hours. No results for input(s): CDIFFPCR in the last 72 hours.   Studies/Results: No results found.  Scheduled Inpatient Medications:   . atenolol  50 mg Oral Daily  . clonazePAM  2 mg Oral BID  . enoxaparin (LOVENOX) injection  40 mg Subcutaneous Q24H  . folic acid  1 mg Oral Daily  . hydrALAZINE  25 mg Oral 3 times per day  . Influenza vac split quadrivalent PF  0.5 mL Intramuscular Tomorrow-1000  . multivitamin with minerals  1 tablet Oral Daily  . pantoprazole  40 mg Oral BID  . potassium chloride  40 mEq Oral BID  . sodium chloride  3 mL Intravenous Q12H  . thiamine  100 mg Oral Daily   Or  . thiamine  100 mg Intravenous Daily    Continuous Inpatient Infusions:     PRN Inpatient Medications:  labetalol, LORazepam **OR** LORazepam, morphine injection, nicotine, ondansetron **OR** ondansetron (ZOFRAN) IV, oxyCODONE-acetaminophen  Miscellaneous:   Assessment:  1. Pancreatitis likely alcohol related. Improved  Plan:  1. Slow advancement of diet 2. Low-fat diet 3. abstain from alcohol counseled. 4. Recommend GI follow-up in about 3 weeks or so. 5. Will sign off consult if needed. Cindra Eves  Skulskie MD 03/23/2015, 11:15 AM

## 2015-03-23 NOTE — Clinical Social Work Note (Addendum)
CSW gave out pt and in-pt alcohol rehab information to pt.  [facility], [facility] and [facility] program in [place] were all given to pt and explained with contact numbers highlighted.  Pt will need to do an intake assessment for each of these programs.  Pt was notified of this.

## 2015-03-23 NOTE — Care Management (Signed)
Patient left before receiving information on [facility], and cost of medications

## 2015-05-09 ENCOUNTER — Encounter: Payer: Self-pay | Admitting: Emergency Medicine

## 2015-05-09 ENCOUNTER — Emergency Department
Admission: EM | Admit: 2015-05-09 | Discharge: 2015-05-10 | Disposition: A | Discharge status: Other Acute Inpatient Hospital | Payer: Self-pay | Attending: Emergency Medicine | Admitting: Emergency Medicine

## 2015-05-09 DIAGNOSIS — F419 Anxiety disorder, unspecified: Secondary | ICD-10-CM | POA: Diagnosis present

## 2015-05-09 DIAGNOSIS — I10 Essential (primary) hypertension: Secondary | ICD-10-CM | POA: Insufficient documentation

## 2015-05-09 DIAGNOSIS — Z720 Tobacco use: Secondary | ICD-10-CM | POA: Insufficient documentation

## 2015-05-09 DIAGNOSIS — Z79899 Other long term (current) drug therapy: Secondary | ICD-10-CM | POA: Insufficient documentation

## 2015-05-09 DIAGNOSIS — F1020 Alcohol dependence, uncomplicated: Secondary | ICD-10-CM | POA: Diagnosis present

## 2015-05-09 DIAGNOSIS — F1029 Alcohol dependence with unspecified alcohol-induced disorder: Secondary | ICD-10-CM | POA: Insufficient documentation

## 2015-05-09 DIAGNOSIS — G629 Polyneuropathy, unspecified: Secondary | ICD-10-CM | POA: Insufficient documentation

## 2015-05-09 DIAGNOSIS — G40909 Epilepsy, unspecified, not intractable, without status epilepticus: Secondary | ICD-10-CM

## 2015-05-09 HISTORY — DX: Essential (primary) hypertension: I10

## 2015-05-09 LAB — BASIC METABOLIC PANEL
ANION GAP: 9 (ref 5–15)
BUN: 7 mg/dL (ref 6–20)
CHLORIDE: 103 mmol/L (ref 101–111)
CO2: 24 mmol/L (ref 22–32)
Calcium: 8.4 mg/dL — ABNORMAL LOW (ref 8.9–10.3)
Creatinine, Ser: 0.69 mg/dL (ref 0.61–1.24)
GFR calc non Af Amer: >60 mL/min (ref >60)
Glucose, Bld: 95 mg/dL (ref 65–99)
POTASSIUM: 3.1 mmol/L — AB (ref 3.5–5.1)
SODIUM: 136 mmol/L (ref 135–145)

## 2015-05-09 LAB — ETHANOL: ALCOHOL ETHYL (B): 7 mg/dL — AB (ref <5)

## 2015-05-09 MED ORDER — LORAZEPAM 2 MG PO TABS: 0.0000 mg | ORAL_TABLET | Freq: Four times a day (QID) | ORAL | Status: DC

## 2015-05-09 MED ORDER — VITAMIN B-1 100 MG PO TABS
100.0000 mg | ORAL_TABLET | Freq: Every day | ORAL | Status: DC
  Administered 2015-05-09 – 2015-05-10 (×2): 100 mg via ORAL
  Filled 2015-05-09 (×2): qty 1

## 2015-05-09 MED ORDER — LORAZEPAM 2 MG PO TABS
0.0000 mg | ORAL_TABLET | Freq: Two times a day (BID) | ORAL | Status: DC
  Administered 2015-05-09: 2 mg via ORAL
  Filled 2015-05-09: qty 1

## 2015-05-09 MED ORDER — LORAZEPAM 1 MG PO TABS
2.0000 mg | ORAL_TABLET | Freq: Once | ORAL | Status: AC
  Administered 2015-05-09: 2 mg via ORAL
  Filled 2015-05-09: qty 2

## 2015-05-09 MED ORDER — POTASSIUM CHLORIDE CRYS ER 20 MEQ PO TBCR
40.0000 meq | EXTENDED_RELEASE_TABLET | Freq: Once | ORAL | Status: AC
Start: 2015-05-09 — End: 2015-05-09
  Administered 2015-05-09: 40 meq via ORAL
  Filled 2015-05-09: qty 2

## 2015-05-09 NOTE — ED Notes (Signed)

## 2015-05-09 NOTE — ED Notes (Addendum)
C/o numbness and tingling to both feet for "a couple of months", no hx of diabetes, states he has "sharp shooting pain to feet at times" hx of low potassium, pt shaky in triage, state he last drank alcohol yesterday

## 2015-05-09 NOTE — BH Assessment (Signed)
Assessment Note  Johnathan Rivera is an 29 y.o. male. Johnathan Rivera states he brought himself to the ED due to "My feet started hurting, and going numb".  He stated that "they told me it could be because of alcohol" he reports drinking about a quart of wine a day.  He reports feelings of anxiety with racing heart and shortness of breath.  He denies current auditory or visual hallucinations. He reports he has had hallucinations several months ago.  He denied having any suicidal or homicidal ideation or intent.  He reports having his last drink yesterday evening. His current BAC = 7.  Diagnosis:  Past Medical History:  Past Medical History  Diagnosis Date  . Seizures (HCC)   . Polysubstance abuse   . Pancreatitis   . Asthma   . Alcohol dependence (HCC)   . Hypertension     Past Surgical History  Procedure Laterality Date  . No past surgeries      Family History:  Family History  Problem Relation Age of Onset  . Diabetes Mother   . Liver disease Neg Hx   . Colon cancer Neg Hx   . Lung cancer Father     Social History:  reports that he has been smoking Cigarettes.  He has been smoking about 1.00 pack per day. He does not have any smokeless tobacco history on file. He reports that he drinks about 1.2 oz of alcohol per week. He reports that he uses illicit drugs (Marijuana and Benzodiazepines).  Additional Social History:  Alcohol / Drug Use History of alcohol / drug use?: Yes Substance #1 Name of Substance 1: alcohol 1 - Age of First Use: 29 1 - Amount (size/oz): quart 1 - Frequency: daily 1 - Last Use / Amount: 04/07/2015  CIWA: CIWA-Ar BP: (!) 154/96 mmHg Pulse Rate: 94 COWS:    Allergies: No Known Allergies  Home Medications:  (Not in a hospital admission)  OB/GYN Status:  No LMP for male patient.  General Assessment Data Location of Assessment: [facility] ED TTS Assessment: In system Is this a Tele or Face-to-Face Assessment?: Face-to-Face Is this an Initial  Assessment or a Re-assessment for this encounter?: Initial Assessment Marital status: Separated Maiden name: n/a Is patient pregnant?: No Pregnancy Status: No Living Arrangements: Parent Can pt return to current living arrangement?: Yes Admission Status: Involuntary Is patient capable of signing voluntary admission?: Yes Referral Source: Self/Family/Friend Insurance type: none  Medical Screening Exam [facility] Walk-in ONLY) Medical Exam completed: Yes  Crisis Care Plan Living Arrangements: Parent Name of Psychiatrist: None Name of Therapist: None  Education Status Is patient currently in school?: No Current Grade: n/a Highest grade of school patient has completed: [employer] Name of school: [employer] Contact person: n/a  Risk to self with the past 6 months Suicidal Ideation: No Has patient been a risk to self within the past 6 months prior to admission? : No Suicidal Intent: No Has patient had any suicidal intent within the past 6 months prior to admission? : No Is patient at risk for suicide?: No Suicidal Plan?: No Has patient had any suicidal plan within the past 6 months prior to admission? : No Access to Means: No What has been your use of drugs/alcohol within the last 12 months?: alcohol use daily Previous Attempts/Gestures: No How many times?: 0 Other Self Harm Risks: Denied Triggers for Past Attempts: None known Intentional Self Injurious Behavior: None Family Suicide History: No Recent stressful life event(s): Job Loss, Financial Problems,  Legal Issues (anxiety) Persecutory voices/beliefs?: No Depression: No Depression Symptoms:  (denied) Substance abuse history and/or treatment for substance abuse?: Yes Suicide prevention information given to non-admitted patients: Not applicable  Risk to Others within the past 6 months Homicidal Ideation: No Does patient have any lifetime risk of violence toward others beyond the six months prior to admission? :  No Thoughts of Harm to Others: No Current Homicidal Intent: No Current Homicidal Plan: No Access to Homicidal Means: No Identified Victim: None reported History of harm to others?: No Assessment of Violence: None Noted Violent Behavior Description: None reported Does patient have access to weapons?: No Criminal Charges Pending?: No Does patient have a court date: No Is patient on probation?: No  Psychosis Hallucinations: None noted Delusions: None noted  Mental Status Report Appearance/Hygiene: In scrubs, Unremarkable Eye Contact: Good Motor Activity: Agitation, Restlessness Speech: Logical/coherent Level of Consciousness: Alert Mood: Anxious Affect: Appropriate to circumstance Anxiety Level: Moderate Thought Processes: Coherent Judgement: Unimpaired Orientation: Person, Place, Situation, Appropriate for developmental age Obsessive Compulsive Thoughts/Behaviors: None  Cognitive Functioning Concentration: Good Memory: Recent Intact IQ: Average Insight: Fair Impulse Control: Fair Appetite: Poor Sleep: Decreased  ADLScreening [facility] Assessment Services) Patient's cognitive ability adequate to safely complete daily activities?: Yes Patient able to express need for assistance with ADLs?: Yes Independently performs ADLs?: Yes (appropriate for developmental age)  Prior Inpatient Therapy Prior Inpatient Therapy: No  Prior Outpatient Therapy Prior Outpatient Therapy: No Does patient have an ACCT team?: No Does patient have Intensive In-House Services?  : No Does patient have [facility] services? : No Does patient have P4CC services?: No  ADL Screening (condition at time of admission) Patient's cognitive ability adequate to safely complete daily activities?: Yes Patient able to express need for assistance with ADLs?: Yes Independently performs ADLs?: Yes (appropriate for developmental age)       Abuse/Neglect Assessment (Assessment to be complete while patient is  alone) Physical Abuse: Denies Verbal Abuse: Denies Sexual Abuse: Denies Exploitation of patient/patient's resources: Denies Self-Neglect: Denies Values / Beliefs Cultural Requests During Hospitalization: None Spiritual Requests During Hospitalization: None   Advance Directives (For Healthcare) Does patient have an advance directive?: No Would patient like information on creating an advanced directive?: Yes - Educational materials given    Additional Information 1:1 In Past 12 Months?: No CIRT Risk: No Elopement Risk: No Does patient have medical clearance?: Yes     Disposition:  Disposition Initial Assessment Completed for this Encounter: Yes Disposition of Patient: Other dispositions  On Site Evaluation by:   Reviewed with Physician:    Justice DeedsKeisha Steel Kerney 05/09/2015 9:28 PM

## 2015-05-09 NOTE — ED Notes (Signed)

## 2015-05-09 NOTE — ED Notes (Signed)
Mother of pt, Ms Senaida OresRichardson called, will call in am

## 2015-05-09 NOTE — ED Notes (Signed)

## 2015-05-09 NOTE — ED Provider Notes (Signed)
[facility] Emergency Department Provider Note  ____________________________________________  Time seen: Approximately 4:16 PM  I have reviewed the triage vital signs and the nursing notes.   HISTORY  Chief Complaint feet numbness     HPI Johnathan Rivera is a 29 y.o. male with a history of alcohol dependence presenting with bilateral foot numbness times several months. Patient reports that over the past several months he has noticed some numbness that initially started on the lateral aspect of the feet and now is in a sock distribution bilaterally. He denies any trauma, pain, fever, sores or wounds. He denies any fever, chills. He is tremulous here today and states that he is always "shaky." He has a history of alcohol related withdrawal seizures, and states his last alcohol was last night which is a long time ago for him. He is not sure whether he is interested and reattempting detox at this time. He denies any hallucinations, nausea or vomiting, or pain.   Past Medical History  Diagnosis Date  . Seizures (HCC)   . Polysubstance abuse   . Pancreatitis   . Asthma   . Alcohol dependence (HCC)   . Hypertension     Patient Active Problem List   Diagnosis Date Noted  . Acute alcoholic pancreatitis 03/21/2015  . Hypokalemia 03/21/2015  . Hypomagnesemia 03/21/2015  . Alcohol abuse 01/04/2015  . Alcohol withdrawal seizure (HCC) 01/04/2015  . Anxiety 01/04/2015  . Alcoholic hepatitis 01/04/2015  . Alcohol dependence (HCC) 01/04/2015  . Elevated LFTs   . Polysubstance abuse     Past Surgical History  Procedure Laterality Date  . No past surgeries      Current Outpatient Rx  Name  Route  Sig  Dispense  Refill  . acetaminophen (TYLENOL) 325 MG tablet   Oral   Take 2 tablets (650 mg total) by mouth every 6 (six) hours as needed for mild pain (or Fever >/= 101). Patient not taking: Reported on 03/21/2015   1 tablet   0   . atenolol (TENORMIN) 50  MG tablet   Oral   Take 1 tablet (50 mg total) by mouth daily. Patient not taking: Reported on 03/21/2015   30 tablet   0   . clonazePAM (KLONOPIN) 2 MG tablet   Oral   Take 1 tablet (2 mg total) by mouth 2 (two) times daily. Patient not taking: Reported on 03/21/2015   30 tablet   0   . folic acid (FOLVITE) 1 MG tablet   Oral   Take 1 tablet (1 mg total) by mouth daily.   30 tablet   0   . hydrALAZINE (APRESOLINE) 25 MG tablet   Oral   Take 1 tablet (25 mg total) by mouth 3 (three) times daily.   90 tablet   0   . Multiple Vitamin (MULTIVITAMIN WITH MINERALS) TABS tablet   Oral   Take 1 tablet by mouth daily.   30 tablet   0   . ondansetron (ZOFRAN) 4 MG tablet      Take 1-2 tabs by mouth every 8 hours as needed for nausea/vomiting Patient not taking: Reported on 03/21/2015   30 tablet   0   . oxyCODONE-acetaminophen (PERCOCET/ROXICET) 5-325 MG per tablet   Oral   Take 1 tablet by mouth every 4 (four) hours as needed for moderate pain.   20 tablet   0   . pantoprazole (PROTONIX) 40 MG tablet   Oral   Take 1 tablet (40  mg total) by mouth 2 (two) times daily.   60 tablet   0   . potassium chloride SA (KLOR-CON M20) 20 MEQ tablet   Oral   Take 1 tablet (20 mEq total) by mouth 2 (two) times daily. Patient not taking: Reported on 03/21/2015   14 tablet   0     Allergies Review of patient's allergies indicates no known allergies.  Family History  Problem Relation Age of Onset  . Diabetes Mother   . Liver disease Neg Hx   . Colon cancer Neg Hx   . Lung cancer Father     Social History Social History  Substance Use Topics  . Smoking status: Current Every Day Smoker -- 1.00 packs/day    Types: Cigarettes  . Smokeless tobacco: None  . Alcohol Use: 1.2 oz/week    2 Glasses of wine per week    Review of Systems Constitutional: No fever/chills. No syncope or lightheadedness. Eyes: No visual changes. ENT: No sore throat. Cardiovascular: Denies chest  pain, palpitations. Respiratory: Denies shortness of breath.  No cough. Gastrointestinal: No abdominal pain.  No nausea, no vomiting.  No diarrhea.  No constipation. Genitourinary: Negative for dysuria. Musculoskeletal: Negative for back pain. Skin: Negative for rash. Neurological: Negative for headaches, focal weakness. No difficulty walking. Positive numbness in the bilateral feet. Negative seizures. Psychiatric:No hallucinations  10-point ROS otherwise negative.  ____________________________________________   PHYSICAL EXAM:  VITAL SIGNS: ED Triage Vitals  Enc Vitals Group     BP 05/09/15 1522 177/115 mmHg     Pulse Rate 05/09/15 1520 96     Resp 05/09/15 1520 18     Temp 05/09/15 1520 98.5 F (36.9 C)     Temp Source 05/09/15 1520 Oral     SpO2 05/09/15 1520 97 %     Weight 05/09/15 1520 175 lb (79.379 kg)     Height 05/09/15 1520 5\' 11"  (1.803 m)     Head Cir --      Peak Flow --      Pain Score 05/09/15 1520 9     Pain Loc --      Pain Edu? --      Excl. in GC? --     Constitutional: Patient is alert and oriented and answering questions appropriately. He does have some tremulousness. Eyes: Conjunctivae are normal.  EOMI. Head: Atraumatic. Nose: No congestion/rhinnorhea. Mouth/Throat: Mucous membranes are moist.  Neck: No stridor.  Supple.  JVD Cardiovascular: Normal rate,  Respiratory: Normal respiratory effort.   Musculoskeletal: No LE edema. Bilateral feet are without any obvious wounds, swelling, or erythema. Normal DP and PT pulses bilaterally. Normal 5 out of 5 dorsi and plantar flexion bilaterally. Decreased sensation to light touch diffusely in a sock distribution. Neurologic:  Normal speech and language. Patient has tremulousness. Decreased sensation as described above. Skin:  Skin is warm, dry and intact. No rash noted. Psychiatric: Mood and affect are normal. Speech and behavior are normal.  Normal  judgement.  ____________________________________________   LABS (all labs ordered are listed, but only abnormal results are displayed)  Labs Reviewed  BASIC METABOLIC PANEL - Abnormal; Notable for the following:    Potassium 3.1 (*)    Calcium 8.4 (*)    All other components within normal limits  ETHANOL - Abnormal; Notable for the following:    Alcohol, Ethyl (B) 7 (*)    All other components within normal limits   ____________________________________________  EKG  ED ECG REPORT I, Sharma CovertNorman, Anne-Caroline, the  attending physician, personally viewed and interpreted this ECG.   Date: 05/09/2015  EKG Time: 1530  Rate: 89  Rhythm: normal sinus rhythm  Axis: Normal  Intervals:none  ST&T Change: Nonspecific T-wave inversion in V1.  ____________________________________________  RADIOLOGY  No results found.  ____________________________________________   PROCEDURES  Procedure(s) performed: None  Critical Care performed: No ____________________________________________   INITIAL IMPRESSION / ASSESSMENT AND PLAN / ED COURSE  Pertinent labs & imaging results that were available during my care of the patient were reviewed by me and considered in my medical decision making (see chart for details).  29 y.o. male with a history of alcohol dependence presenting with bilateral foot numbness over the last several months. 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 I do not see any evidence of infection or embolic disease. This is not consistent with a CVA. His electrolytes do show some hypokalemia but this isolated numbness is unlikely be to be related to that. I will supplement his hypokalemia. At this time, the patient is thinking about whether he would like to undergo detox at this time or whether he prefers outpatient follow-up. He understands the risks of stopping alcohol cold [place] including life-threatening  seizures.   I reevaluated the patient continued to be stable and felt improved after Ativan. At this time, the patient is requesting detox that he was moved over to the a pod and signed out to Dr. Lenard Lance, who is aware of his history of withdrawal seizures as well as lack of recent alcohol intake. ____________________________________________  FINAL CLINICAL IMPRESSION(S) / ED DIAGNOSES  Final diagnoses:  Alcohol dependence with unspecified alcohol-induced disorder (HCC)  Neuropathy (HCC)      NEW MEDICATIONS STARTED DURING THIS VISIT:  New Prescriptions   No medications on file     Rockne Menghini, MD 05/09/15 2022

## 2015-05-10 DIAGNOSIS — G629 Polyneuropathy, unspecified: Secondary | ICD-10-CM

## 2015-05-10 DIAGNOSIS — G40909 Epilepsy, unspecified, not intractable, without status epilepticus: Secondary | ICD-10-CM

## 2015-05-10 DIAGNOSIS — F10239 Alcohol dependence with withdrawal, unspecified: Secondary | ICD-10-CM

## 2015-05-10 LAB — CBC WITH DIFFERENTIAL/PLATELET
BASOS ABS: 0 10*3/uL (ref 0–0.1)
BASOS PCT: 1 %
EOS PCT: 2 %
Eosinophils Absolute: 0.1 10*3/uL (ref 0–0.7)
HEMATOCRIT: 34.8 % — AB (ref 40.0–52.0)
Hemoglobin: 12.2 g/dL — ABNORMAL LOW (ref 13.0–18.0)
Lymphocytes Relative: 28 %
Lymphs Abs: 2 10*3/uL (ref 1.0–3.6)
MCH: 32.8 pg (ref 26.0–34.0)
MCHC: 35.1 g/dL (ref 32.0–36.0)
MCV: 93.6 fL (ref 80.0–100.0)
MONO ABS: 1 10*3/uL (ref 0.2–1.0)
Monocytes Relative: 15 %
NEUTROS ABS: 3.9 10*3/uL (ref 1.4–6.5)
Neutrophils Relative %: 54 %
PLATELETS: 174 10*3/uL (ref 150–440)
RBC: 3.72 MIL/uL — ABNORMAL LOW (ref 4.40–5.90)
RDW: 15.4 % — AB (ref 11.5–14.5)
WBC: 7.1 10*3/uL (ref 3.8–10.6)

## 2015-05-10 LAB — CARBAMAZEPINE LEVEL, TOTAL: Carbamazepine Lvl: <2 ug/mL — ABNORMAL LOW (ref 4.0–12.0)

## 2015-05-10 MED ORDER — HYDROXYZINE PAMOATE 50 MG PO CAPS: 50.0000 mg | ORAL_CAPSULE | Freq: Three times a day (TID) | ORAL | Status: DC | PRN

## 2015-05-10 MED ORDER — DOCUSATE SODIUM 100 MG PO CAPS
ORAL_CAPSULE | ORAL | Status: AC
  Filled 2015-05-10: qty 1

## 2015-05-10 MED ORDER — BUSPIRONE HCL 10 MG PO TABS: 10.0000 mg | ORAL_TABLET | Freq: Three times a day (TID) | ORAL | Status: AC

## 2015-05-10 MED ORDER — CARBAMAZEPINE 200 MG PO TABS: 200.0000 mg | ORAL_TABLET | Freq: Two times a day (BID) | ORAL | Status: DC

## 2015-05-10 MED ORDER — POTASSIUM CHLORIDE CRYS ER 20 MEQ PO TBCR
20.0000 meq | EXTENDED_RELEASE_TABLET | Freq: Once | ORAL | Status: AC
  Administered 2015-05-10: 20 meq via ORAL
  Filled 2015-05-10: qty 1

## 2015-05-10 NOTE — ED Notes (Signed)
Pt observed lying in bed - watching TV   Pt visualized with NAD  No verbalized needs or concerns at this time  Continue to monitor 

## 2015-05-10 NOTE — ED Notes (Signed)
BEHAVIORAL HEALTH ROUNDING Patient sleeping: No. Patient alert and oriented: yes Behavior appropriate: Yes.  ; If no, describe:  Nutrition and fluids offered: yes Toileting and hygiene offered: Yes  Sitter present: q15 minute observations and security camera monitoring Law enforcement present: Yes  ODS  Pt observed lying in bed - watching TV   Pt visualized with NAD  No verbalized needs or concerns at this time  Continue to monitor  

## 2015-05-10 NOTE — ED Notes (Signed)
2/2 bags of belongings returned to him and he verbalized that he has received back all items that he came here with - discharge, follow up instructions and scripts provided at this time and he has verbalized agreement and understanding   He denies pain

## 2015-05-10 NOTE — ED Notes (Signed)
Pt observed with no unusual behavior  He is sitting up in bed  - his TV is on   Appropriate to stimulation  No verbalized needs or concerns at this time  NAD assessed  Continue to monitor

## 2015-05-10 NOTE — ED Notes (Signed)
Pt. To ED-[facility] from ED ambulatory without difficulty, to room #3  . Report from RN. Pt. Is alert and oriented, warm and dry in no distress. Pt. Denies SI, HI, and AVH. Pt. Calm and cooperative Pt. C/o pain in both feet; with numbness and burning. Pt. Made aware of security cameras and Q15 minute rounds. Pt. Encouraged to let Nursing staff know of any concerns or needs.

## 2015-05-10 NOTE — ED Notes (Signed)
Pt. Noted in room sleeping;. No complaints or concerns voiced. No distress or abnormal behavior noted. Will continue to monitor with security cameras. Q 15 minute rounds continue. 

## 2015-05-10 NOTE — ED Notes (Signed)
BEHAVIORAL HEALTH ROUNDING Patient sleeping: No. Patient alert and oriented: yes Behavior appropriate: Yes.  ; If no, describe:  Nutrition and fluids offered: yes Toileting and hygiene offered: Yes  Sitter present: q15 minute observations and security camera monitoring Law enforcement present: Yes  ODS  

## 2015-05-10 NOTE — Consult Note (Signed)
[facility] Consult   Reason for Consult:  Consult for this 29 year old man with a history of alcohol abuse who came to the hospital because of peripheral neuropathy Referring Physician:  Marcelene Butte Patient Identification: Johnathan Rivera MRN:  [id number] Principal Diagnosis: Alcohol dependence [facility]) Diagnosis:   Patient Active Problem List   Diagnosis Date Noted  . Peripheral neuropathy ([place]) [G62.9] 05/10/2015  . Seizure disorder ([place]) [U20.254] 05/10/2015  . Acute alcoholic pancreatitis [Y70.6] 03/21/2015  . Hypokalemia [E87.6] 03/21/2015  . Hypomagnesemia [E83.42] 03/21/2015  . Alcohol abuse [F10.10] 01/04/2015  . Alcohol withdrawal seizure ([place]) [C37.628, R56.9] 01/04/2015  . Anxiety [F41.9] 01/04/2015  . Alcoholic hepatitis [B15.17] 01/04/2015  . Alcohol dependence ([place]) [F10.20] 01/04/2015  . Elevated LFTs [R79.89]   . Polysubstance abuse [F19.10]     Total Time spent with patient: 1 hour  Subjective:   Johnathan Rivera is a 29 y.o. male patient admitted with "I just need to get off of this alcohol again".  HPI:  Information from the patient and the chart. 29 year old man who has a history of alcohol abuse going back many years. He was at a [facility] last month and completed the program. After coming home he stayed sober briefly but then relapsed. He has been drinking about a pint of wine a day. Occasional marijuana but no other drugs. Mood is been feeling nervous and down. He says that he took the Tegretol BuSpar and Vistaril that he was given at discharge briefly but is no longer on them. He has not gone for any outpatient follow-up. He does not feel particularly depressed and denies suicidal thoughts and denies any hallucinations.  Social history: Patient lives with his mother. He is not currently working but is trying to get a job as soon as possible.  Medical history: Has had recurrent episodes of pancreatitis but when he is not drinking it doesn't usually  flareup. He has had seizures in the past possibly related to alcohol and was recently prescribed buspirone. Has intermittently been told he had high blood pressure.  Substance abuse history: Long history of alcohol abuse. Has had seizures in the past doesn't appear to have had delirium tremens. Says that he doesn't use any other drugs except for the occasional marijuana.  Current medication: Patient wasn't the best historian but it sounds like he was on Tegretol BuSpar and probably Vistaril. He is unclear about the doses.  Past Psychiatric History: Patient has been admitted to inpatient detox before but not to inpatient psychiatry units. No history of suicide attempts no history of violence.  Risk to Self: Suicidal Ideation: No Suicidal Intent: No Is patient at risk for suicide?: No Suicidal Plan?: No Access to Means: No What has been your use of drugs/alcohol within the last 12 months?: alcohol use daily How many times?: 0 Other Self Harm Risks: Denied Triggers for Past Attempts: None known Intentional Self Injurious Behavior: None Risk to Others: Homicidal Ideation: No Thoughts of Harm to Others: No Current Homicidal Intent: No Current Homicidal Plan: No Access to Homicidal Means: No Identified Victim: None reported History of harm to others?: No Assessment of Violence: None Noted Violent Behavior Description: None reported Does patient have access to weapons?: No Criminal Charges Pending?: No Does patient have a court date: No Prior Inpatient Therapy: Prior Inpatient Therapy: No Prior Outpatient Therapy: Prior Outpatient Therapy: No Does patient have an ACCT team?: No Does patient have Intensive In-House Services?  : No Does patient have [facility] services? : No Does  patient have P4CC services?: No  Past Medical History:  Past Medical History  Diagnosis Date  . Seizures ([place])   . Polysubstance abuse   . Pancreatitis   . Asthma   . Alcohol dependence ([place])   .  Hypertension     Past Surgical History  Procedure Laterality Date  . No past surgeries     Family History:  Family History  Problem Relation Age of Onset  . Diabetes Mother   . Liver disease Neg Hx   . Colon cancer Neg Hx   . Lung cancer Father    Family Psychiatric  History: Father had alcohol abuse and depression Social History:  History  Alcohol Use  . 1.2 oz/week  . 2 Glasses of wine per week     History  Drug Use  . Yes  . Special: Marijuana, Benzodiazepines    Social History   Social History  . Marital Status: Legally Separated    Spouse Name: N/A  . Number of Children: 1  . Years of Education: N/A   Social History Main Topics  . Smoking status: Current Every Day Smoker -- 1.00 packs/day    Types: Cigarettes  . Smokeless tobacco: None  . Alcohol Use: 1.2 oz/week    2 Glasses of wine per week  . Drug Use: Yes    Special: Marijuana, Benzodiazepines  . Sexual Activity: Yes   Other Topics Concern  . None   Social History Narrative   This with his mother and has one son age 3   Additional Social History:    History of alcohol / drug use?: Yes Name of Substance 1: alcohol 1 - Age of First Use: 29 1 - Amount (size/oz): quart 1 - Frequency: daily 1 - Last Use / Amount: 04/07/2015                   Allergies:  No Known Allergies  Labs:  Results for orders placed or performed during the hospital encounter of 05/09/15 (from the past 48 hour(s))  Basic metabolic panel     Status: Abnormal   Collection Time: 05/09/15  3:33 PM  Result Value Ref Range   Sodium 136 135 - 145 mmol/L   Potassium 3.1 (L) 3.5 - 5.1 mmol/L    Comment: HEMOLYSIS AT THIS LEVEL MAY AFFECT RESULT   Chloride 103 101 - 111 mmol/L   CO2 24 22 - 32 mmol/L   Glucose, Bld 95 65 - 99 mg/dL   BUN 7 6 - 20 mg/dL   Creatinine, Ser 0.69 0.61 - 1.24 mg/dL   Calcium 8.4 (L) 8.9 - 10.3 mg/dL   GFR calc non Af Amer >60 >60 mL/min   GFR calc Af Amer >60 >60 mL/min    Comment:  (NOTE) The eGFR has been calculated using the CKD EPI equation. This calculation has not been validated in all clinical situations. eGFR's persistently <60 mL/min signify possible Chronic Kidney Disease.    Anion gap 9 5 - 15  Ethanol     Status: Abnormal   Collection Time: 05/09/15  3:34 PM  Result Value Ref Range   Alcohol, Ethyl (B) 7 (H) <5 mg/dL    Comment:        LOWEST DETECTABLE LIMIT FOR SERUM ALCOHOL IS 5 mg/dL FOR MEDICAL PURPOSES ONLY   CBC with Differential/Platelet     Status: Abnormal   Collection Time: 05/10/15 11:19 AM  Result Value Ref Range   WBC 7.1 3.8 - 10.6 K/uL  RBC 3.72 (L) 4.40 - 5.90 MIL/uL   Hemoglobin 12.2 (L) 13.0 - 18.0 g/dL   HCT 34.8 (L) 40.0 - 52.0 %   MCV 93.6 80.0 - 100.0 fL   MCH 32.8 26.0 - 34.0 pg   MCHC 35.1 32.0 - 36.0 g/dL   RDW 15.4 (H) 11.5 - 14.5 %   Platelets 174 150 - 440 K/uL   Neutrophils Relative % 54 %   Neutro Abs 3.9 1.4 - 6.5 K/uL   Lymphocytes Relative 28 %   Lymphs Abs 2.0 1.0 - 3.6 K/uL   Monocytes Relative 15 %   Monocytes Absolute 1.0 0.2 - 1.0 K/uL   Eosinophils Relative 2 %   Eosinophils Absolute 0.1 0 - 0.7 K/uL   Basophils Relative 1 %   Basophils Absolute 0.0 0 - 0.1 K/uL  Carbamazepine level, total     Status: Abnormal   Collection Time: 05/10/15 11:19 AM  Result Value Ref Range   Carbamazepine Lvl <2.0 (L) 4.0 - 12.0 ug/mL    Current Facility-Administered Medications  Medication Dose Route Frequency Provider Last Rate Last Dose  . LORazepam (ATIVAN) tablet 0-4 mg  0-4 mg Oral 4 times per day Harvest Dark, MD   0 mg at 05/09/15 2000  . LORazepam (ATIVAN) tablet 0-4 mg  0-4 mg Oral Q12H Harvest Dark, MD   2 mg at 05/09/15 2232  . thiamine (VITAMIN B-1) tablet 100 mg  100 mg Oral Daily Harvest Dark, MD   100 mg at 05/10/15 1047   Current Outpatient Prescriptions  Medication Sig Dispense Refill  . acetaminophen (TYLENOL) 325 MG tablet Take 2 tablets (650 mg total) by mouth every 6 (six)  hours as needed for mild pain (or Fever >/= 101). (Patient not taking: Reported on 03/21/2015) 1 tablet 0  . atenolol (TENORMIN) 50 MG tablet Take 1 tablet (50 mg total) by mouth daily. (Patient not taking: Reported on 03/21/2015) 30 tablet 0  . clonazePAM (KLONOPIN) 2 MG tablet Take 1 tablet (2 mg total) by mouth 2 (two) times daily. (Patient not taking: Reported on 03/21/2015) 30 tablet 0  . folic acid (FOLVITE) 1 MG tablet Take 1 tablet (1 mg total) by mouth daily. 30 tablet 0  . hydrALAZINE (APRESOLINE) 25 MG tablet Take 1 tablet (25 mg total) by mouth 3 (three) times daily. 90 tablet 0  . Multiple Vitamin (MULTIVITAMIN WITH MINERALS) TABS tablet Take 1 tablet by mouth daily. 30 tablet 0  . ondansetron (ZOFRAN) 4 MG tablet Take 1-2 tabs by mouth every 8 hours as needed for nausea/vomiting (Patient not taking: Reported on 03/21/2015) 30 tablet 0  . oxyCODONE-acetaminophen (PERCOCET/ROXICET) 5-325 MG per tablet Take 1 tablet by mouth every 4 (four) hours as needed for moderate pain. 20 tablet 0  . pantoprazole (PROTONIX) 40 MG tablet Take 1 tablet (40 mg total) by mouth 2 (two) times daily. 60 tablet 0  . potassium chloride SA (KLOR-CON M20) 20 MEQ tablet Take 1 tablet (20 mEq total) by mouth 2 (two) times daily. (Patient not taking: Reported on 03/21/2015) 14 tablet 0    Musculoskeletal: Strength & Muscle Tone: within normal limits Gait & Station: normal Patient leans: N/A  Psychiatric Specialty Exam: Review of Systems  Constitutional: Negative.   HENT: Negative.   Eyes: Negative.   Respiratory: Negative.   Cardiovascular: Negative.   Gastrointestinal: Negative.   Musculoskeletal: Negative.   Skin: Negative.   Neurological: Negative.   Psychiatric/Behavioral: Positive for substance abuse. Negative for depression, suicidal ideas, hallucinations  and memory loss. The patient is nervous/anxious and has insomnia.     Blood pressure 156/105, pulse 102, temperature 98.9 F (37.2 C), temperature  source Oral, resp. rate 18, height 5' 11"  (1.803 m), weight 79.379 kg (175 lb), SpO2 99 %.Body mass index is 24.42 kg/(m^2).  General Appearance: Casual  Eye Contact::  Minimal  Speech:  Slow  Volume:  Decreased  Mood:  Euthymic  Affect:  Constricted  Thought Process:  Goal Directed  Orientation:  Full (Time, Place, and Person)  Thought Content:  Negative  Suicidal Thoughts:  No  Homicidal Thoughts:  No  Memory:  Immediate;   Good Recent;   Fair Remote;   Fair  Judgement:  Fair  Insight:  Fair  Psychomotor Activity:  Normal  Concentration:  Fair  Recall:  [employer] of Knowledge:Fair  Language: Fair  Akathisia:  No  Handed:  Right  AIMS (if indicated):     Assets:  Communication Skills Desire for Improvement Financial Resources/Insurance Resilience Social Support  ADL's:  Intact  Cognition: WNL  Sleep:      Treatment Plan Summary: Medication management and Plan At this point the patient seems to be not having really active withdrawal symptoms. His blood pressure and pulse were elevated this morning but he is calm and not trembling this afternoon. He hasn't had any hallucinations or signs of delirium. Patient is not voicing any suicidal or homicidal ideation. He does not meet criteria for inpatient psychiatric hospitalization. I propose that I restart his Tegretol 200 mg twice a day, buspirone 10 mg 3 times a day and Vistaril 50 mg twice a day as needed for anxiety and given him prescriptions for this. He agreed to that. Case discussed with emergency room doctor. Case discussed with TTS intake. Patient will be referred to local substance abuse providing authorities including [place] and [employer]. He can be discharged from the emergency room.  Disposition: No evidence of imminent risk to self or others at present.   Patient does not meet criteria for psychiatric inpatient admission. Refer to IOP.  Cattie Tineo 05/10/2015 3:30 PM

## 2015-05-10 NOTE — Progress Notes (Signed)
Per request of Dr. Toni Amendlapacs, writer provided the pt. with information and instructions on how to access Outpatient Mental Health & Substance Abuse Treatment ([facility]).  05/10/2015 Johnathan FlashNicole Aizza Santiago, MS, NCC, [facility]CA Therapeutic Triage Specialist

## 2015-05-10 NOTE — ED Notes (Signed)

## 2015-05-10 NOTE — Discharge Instructions (Signed)

## 2015-05-10 NOTE — ED Notes (Signed)
Pt. Noted in room resting quietly;. No complaints or concerns voiced. No distress or abnormal behavior noted. Will continue to monitor with security cameras. Q 15 minute rounds continue. 

## 2015-05-10 NOTE — ED Notes (Addendum)
Breakfast provided  He is sitting up in bed  Pt states  "I am ready to go  - I have a job interview at 0930 today and i need to go to that  - how long before the doctor comes in today."  Pt informed that he is here for detox evaluation and that he is voluntary   If he wants to leave he may leave - i will ask the ED physician for discharge paperwork  Pt then states  "I have seizures and anxiety -  I need my medicine cause I am totally out."  Pt questioned about where he normally gets his scripts and he states that it is in a clinic - I asked if he could follow up there and get new scripts and he reports to me that he could not   Med reconciliation has not been completed - I have contacted the pharmtech to update  Pt reports that he takes "tegretol"     I will await med reconciliation due to I do not see that on an old list   Assessment completed  He denies pain

## 2015-05-10 NOTE — ED Notes (Signed)
Pt. Noted in  Awake and watching the tv. No complaints or concerns voiced. No distress or abnormal behavior noted. Will continue to monitor with security cameras. Q 15 minute rounds continue.

## 2015-05-10 NOTE — ED Notes (Signed)
ED BHU PLACEMENT JUSTIFICATION Is the patient under IVC or is there intent for IVC: NO Is the patient medically cleared: Yes.   Is there vacancy in the ED BHU: Yes.   Is the population mix appropriate for patient: Yes.   Is the patient awaiting placement in inpatient or outpatient setting: ? Detox placement    Has the patient had a psychiatric consult:    Survey of unit performed for contraband, proper placement and condition of furniture, tampering with fixtures in bathroom, shower, and each patient room: Yes.  ; Findings:  APPEARANCE/BEHAVIOR Calm and cooperative NEURO ASSESSMENT Orientation: oriented x3  Denies pain Hallucinations: No.None noted (Hallucinations) Speech: Normal Gait: normal RESPIRATORY ASSESSMENT Even  Unlabored respirations  CARDIOVASCULAR ASSESSMENT Pulses equal   regular rate  Skin warm and dry   GASTROINTESTINAL ASSESSMENT no GI complaint EXTREMITIES Full ROM  PLAN OF CARE Provide calm/safe environment. Vital signs assessed twice daily. ED BHU Assessment once each 12-hour shift. Collaborate with intake RN daily or as condition indicates. Assure the ED provider has rounded once each shift. Provide and encourage hygiene. Provide redirection as needed. Assess for escalating behavior; address immediately and inform ED provider.  Assess family dynamic and appropriateness for visitation as needed: Yes.  ; If necessary, describe findings:  Educate the patient/family about BHU procedures/visitation: Yes.  ; If necessary, describe findings:

## 2015-05-10 NOTE — ED Notes (Signed)
Lunch provided along with an extra drink  Pt observed with no unusual behavior  Appropriate to stimulation  No verbalized needs or concerns at this time  NAD assessed  Continue to monitor 

## 2015-05-10 NOTE — ED Provider Notes (Signed)
-----------------------------------------   6:26 AM on 05/10/2015 -----------------------------------------   Blood pressure 154/96, pulse 94, temperature 99.1 F (37.3 C), temperature source Oral, resp. rate 18, height 5\' 11"  (1.803 m), weight 175 lb (79.379 kg), SpO2 99 %.  The patient had no acute events since last update.  Calm and cooperative at this time.  Disposition is pending per Psychiatry/Behavioral Medicine team recommendations.     Irean HongJade J Landon Bassford, MD 05/10/15 [phone]

## 2015-05-10 NOTE — ED Notes (Signed)
Supper provided along with an extra drink  Pt observed with no unusual behavior  Appropriate to stimulation  No verbalized needs or concerns at this time  NAD assessed    He will be discharge to home  Continue to monitor

## 2015-05-10 NOTE — ED Notes (Signed)
Clapacs is consulting at this time 

## 2015-05-10 NOTE — ED Provider Notes (Signed)
-----------------------------------------   4:05 PM on 05/10/2015 -----------------------------------------   Blood pressure 156/105, pulse 102, temperature 98.9 F (37.2 C), temperature source Oral, resp. rate 18, height 5\' 11"  (1.803 m), weight 175 lb (79.379 kg), SpO2 99 %.  The patient had no acute events since last update.  Calm and cooperative at this time.  Disposition is pending per Psychiatry/Behavioral Medicine team recommendations.   Patient will be discharged per the psychiatrist. Patient does not exhibit any signs of alcohol or substance withdrawal at this point otherwise remained stable and tolerated his observation time well.  Jennye MoccasinBrian S Quigley, MD 05/10/15 [phone]

## 2015-05-10 NOTE — ED Notes (Signed)
Report received from Samella ParrNoel R., RN. Pt. Alert and oriented in no distress denies SI, HI, AVH and pain.  Pt. Instructed to come to me with problems or concerns.Will continue to monitor for safety via security cameras and Q 15 minute checks.

## 2015-05-22 ENCOUNTER — Encounter: Payer: Self-pay | Admitting: Intensive Care

## 2015-05-22 ENCOUNTER — Emergency Department: Payer: Self-pay

## 2015-05-22 ENCOUNTER — Inpatient Hospital Stay
Admission: EM | Admit: 2015-05-22 | Discharge: 2015-05-24 | DRG: 897 | Disposition: A | Discharge status: Home / Self Care | Payer: Self-pay | Attending: Internal Medicine | Admitting: Internal Medicine

## 2015-05-22 DIAGNOSIS — S0191XA Laceration without foreign body of unspecified part of head, initial encounter: Secondary | ICD-10-CM | POA: Diagnosis present

## 2015-05-22 DIAGNOSIS — F10939 Alcohol use, unspecified with withdrawal, unspecified: Secondary | ICD-10-CM | POA: Diagnosis present

## 2015-05-22 DIAGNOSIS — F1290 Cannabis use, unspecified, uncomplicated: Secondary | ICD-10-CM | POA: Diagnosis present

## 2015-05-22 DIAGNOSIS — G40909 Epilepsy, unspecified, not intractable, without status epilepticus: Secondary | ICD-10-CM | POA: Diagnosis present

## 2015-05-22 DIAGNOSIS — F10239 Alcohol dependence with withdrawal, unspecified: Principal | ICD-10-CM | POA: Diagnosis present

## 2015-05-22 DIAGNOSIS — R569 Unspecified convulsions: Secondary | ICD-10-CM

## 2015-05-22 DIAGNOSIS — F10230 Alcohol dependence with withdrawal, uncomplicated: Secondary | ICD-10-CM

## 2015-05-22 DIAGNOSIS — F1010 Alcohol abuse, uncomplicated: Secondary | ICD-10-CM | POA: Diagnosis present

## 2015-05-22 DIAGNOSIS — E876 Hypokalemia: Secondary | ICD-10-CM | POA: Diagnosis present

## 2015-05-22 DIAGNOSIS — F19980 Other psychoactive substance use, unspecified with psychoactive substance-induced anxiety disorder: Secondary | ICD-10-CM

## 2015-05-22 DIAGNOSIS — J45909 Unspecified asthma, uncomplicated: Secondary | ICD-10-CM | POA: Diagnosis present

## 2015-05-22 DIAGNOSIS — F329 Major depressive disorder, single episode, unspecified: Secondary | ICD-10-CM | POA: Diagnosis present

## 2015-05-22 DIAGNOSIS — F419 Anxiety disorder, unspecified: Secondary | ICD-10-CM | POA: Diagnosis present

## 2015-05-22 DIAGNOSIS — K7010 Alcoholic hepatitis without ascites: Secondary | ICD-10-CM | POA: Diagnosis present

## 2015-05-22 DIAGNOSIS — F17210 Nicotine dependence, cigarettes, uncomplicated: Secondary | ICD-10-CM | POA: Diagnosis present

## 2015-05-22 DIAGNOSIS — I10 Essential (primary) hypertension: Secondary | ICD-10-CM | POA: Diagnosis present

## 2015-05-22 DIAGNOSIS — F10930 Alcohol use, unspecified with withdrawal, uncomplicated: Secondary | ICD-10-CM

## 2015-05-22 DIAGNOSIS — G629 Polyneuropathy, unspecified: Secondary | ICD-10-CM | POA: Diagnosis present

## 2015-05-22 DIAGNOSIS — W19XXXA Unspecified fall, initial encounter: Secondary | ICD-10-CM | POA: Diagnosis present

## 2015-05-22 LAB — COMPREHENSIVE METABOLIC PANEL
ALT: 139 U/L — ABNORMAL HIGH (ref 17–63)
AST: 741 U/L — ABNORMAL HIGH (ref 15–41)
Albumin: 3.6 g/dL (ref 3.5–5.0)
Alkaline Phosphatase: 89 U/L (ref 38–126)
Anion gap: 9 (ref 5–15)
BUN: 7 mg/dL (ref 6–20)
CHLORIDE: 99 mmol/L — AB (ref 101–111)
CO2: 29 mmol/L (ref 22–32)
Calcium: 9 mg/dL (ref 8.9–10.3)
Creatinine, Ser: 0.8 mg/dL (ref 0.61–1.24)
Glucose, Bld: 109 mg/dL — ABNORMAL HIGH (ref 65–99)
POTASSIUM: 3.1 mmol/L — AB (ref 3.5–5.1)
Sodium: 137 mmol/L (ref 135–145)
TOTAL PROTEIN: 7.1 g/dL (ref 6.5–8.1)
Total Bilirubin: 1.1 mg/dL (ref 0.3–1.2)

## 2015-05-22 LAB — CBC
HCT: 33.5 % — ABNORMAL LOW (ref 40.0–52.0)
Hemoglobin: 11.6 g/dL — ABNORMAL LOW (ref 13.0–18.0)
MCH: 32.4 pg (ref 26.0–34.0)
MCHC: 34.7 g/dL (ref 32.0–36.0)
MCV: 93.4 fL (ref 80.0–100.0)
PLATELETS: 223 10*3/uL (ref 150–440)
RBC: 3.59 MIL/uL — ABNORMAL LOW (ref 4.40–5.90)
RDW: 15.4 % — AB (ref 11.5–14.5)
WBC: 6.6 10*3/uL (ref 3.8–10.6)

## 2015-05-22 LAB — ETHANOL

## 2015-05-22 LAB — MAGNESIUM: Magnesium: 2 mg/dL (ref 1.7–2.4)

## 2015-05-22 MED ORDER — ACETAMINOPHEN 650 MG RE SUPP: 650.0000 mg | Freq: Four times a day (QID) | RECTAL | Status: DC | PRN

## 2015-05-22 MED ORDER — LORAZEPAM 2 MG/ML IJ SOLN: 0.0000 mg | Freq: Two times a day (BID) | INTRAMUSCULAR | Status: DC

## 2015-05-22 MED ORDER — LORAZEPAM 2 MG/ML IJ SOLN
0.0000 mg | Freq: Four times a day (QID) | INTRAMUSCULAR | Status: DC
Start: 2015-05-22 — End: 2015-05-23

## 2015-05-22 MED ORDER — LORAZEPAM 2 MG PO TABS: 0.0000 mg | ORAL_TABLET | Freq: Two times a day (BID) | ORAL | Status: DC

## 2015-05-22 MED ORDER — ALBUTEROL SULFATE (2.5 MG/3ML) 0.083% IN NEBU: 2.5000 mg | INHALATION_SOLUTION | RESPIRATORY_TRACT | Status: DC | PRN

## 2015-05-22 MED ORDER — THIAMINE HCL 100 MG/ML IJ SOLN
Freq: Once | INTRAVENOUS | Status: AC
  Administered 2015-05-22: 18:00:00 via INTRAVENOUS
  Filled 2015-05-22 (×2): qty 1000

## 2015-05-22 MED ORDER — CARBAMAZEPINE 200 MG PO TABS
200.0000 mg | ORAL_TABLET | Freq: Two times a day (BID) | ORAL | Status: DC
  Administered 2015-05-22 – 2015-05-24 (×4): 200 mg via ORAL
  Filled 2015-05-22 (×6): qty 1

## 2015-05-22 MED ORDER — HYDROXYZINE PAMOATE 50 MG PO CAPS
50.0000 mg | ORAL_CAPSULE | Freq: Three times a day (TID) | ORAL | Status: DC | PRN
  Administered 2015-05-22: 17:00:00 50 mg via ORAL
  Filled 2015-05-22 (×2): qty 1

## 2015-05-22 MED ORDER — POTASSIUM CHLORIDE IN NACL 20-0.9 MEQ/L-% IV SOLN
INTRAVENOUS | Status: DC
  Administered 2015-05-22 – 2015-05-23 (×3): via INTRAVENOUS
  Filled 2015-05-22 (×4): qty 1000

## 2015-05-22 MED ORDER — FOLIC ACID 1 MG PO TABS
1.0000 mg | ORAL_TABLET | Freq: Every day | ORAL | Status: DC
  Administered 2015-05-22 – 2015-05-24 (×3): 1 mg via ORAL
  Filled 2015-05-22 (×3): qty 1

## 2015-05-22 MED ORDER — LORAZEPAM 2 MG PO TABS: 0.0000 mg | ORAL_TABLET | Freq: Four times a day (QID) | ORAL | Status: DC

## 2015-05-22 MED ORDER — THIAMINE HCL 100 MG/ML IJ SOLN
100.0000 mg | Freq: Every day | INTRAMUSCULAR | Status: DC
Start: 2015-05-22 — End: 2015-05-22

## 2015-05-22 MED ORDER — ACETAMINOPHEN 325 MG PO TABS: 650.0000 mg | ORAL_TABLET | Freq: Four times a day (QID) | ORAL | Status: DC | PRN

## 2015-05-22 MED ORDER — LORAZEPAM 2 MG/ML IJ SOLN
1.0000 mg | Freq: Once | INTRAMUSCULAR | Status: AC
  Administered 2015-05-22: 1 mg via INTRAVENOUS
  Filled 2015-05-22: qty 1

## 2015-05-22 MED ORDER — VITAMIN B-1 100 MG PO TABS
100.0000 mg | ORAL_TABLET | Freq: Every day | ORAL | Status: DC
  Administered 2015-05-22: 100 mg via ORAL
  Filled 2015-05-22: qty 1

## 2015-05-22 MED ORDER — BUSPIRONE HCL 10 MG PO TABS
10.0000 mg | ORAL_TABLET | Freq: Three times a day (TID) | ORAL | Status: DC
  Administered 2015-05-22 – 2015-05-24 (×6): 10 mg via ORAL
  Filled 2015-05-22 (×6): qty 1

## 2015-05-22 MED ORDER — ONDANSETRON HCL 4 MG PO TABS: 4.0000 mg | ORAL_TABLET | Freq: Four times a day (QID) | ORAL | Status: DC | PRN

## 2015-05-22 MED ORDER — THIAMINE HCL 100 MG/ML IJ SOLN
100.0000 mg | Freq: Every day | INTRAMUSCULAR | Status: DC
  Administered 2015-05-23 – 2015-05-24 (×2): 100 mg via INTRAVENOUS
  Filled 2015-05-22 (×2): qty 2

## 2015-05-22 MED ORDER — ENOXAPARIN SODIUM 40 MG/0.4ML ~~LOC~~ SOLN
40.0000 mg | SUBCUTANEOUS | Status: DC
  Administered 2015-05-22 – 2015-05-23 (×2): 40 mg via SUBCUTANEOUS
  Filled 2015-05-22 (×2): qty 0.4

## 2015-05-22 MED ORDER — HYDRALAZINE HCL 25 MG PO TABS
25.0000 mg | ORAL_TABLET | Freq: Three times a day (TID) | ORAL | Status: DC
  Administered 2015-05-22 – 2015-05-24 (×6): 25 mg via ORAL
  Filled 2015-05-22 (×6): qty 1

## 2015-05-22 MED ORDER — ONDANSETRON HCL 4 MG/2ML IJ SOLN: 4.0000 mg | Freq: Four times a day (QID) | INTRAMUSCULAR | Status: DC | PRN

## 2015-05-22 NOTE — H&P (Signed)
[facility] - [place] at [facility]   PATIENT NAME: Johnathan Rivera    MR#:  [id number]  DATE OF BIRTH:  1986/01/02  DATE OF ADMISSION:  05/22/2015  PRIMARY CARE PHYSICIAN: Evelene Croon, MD   REQUESTING/REFERRING PHYSICIAN: Jene Every, MD  CHIEF COMPLAINT:   Chief Complaint  Patient presents with  . Seizures   Seizure at about 8 AM today.  HISTORY OF PRESENT ILLNESS:  Johnathan Rivera  is a 29 y.o. male with a known history of seizure, hypertension, polysubstance abuse and alcohol abuse. The patient was sent by his mother this morning due to seizure episodes. The patient had 2 episode of seizure with whole-body shaking and tongue bite at home today. He fell and hit his head with some laceration on the head. He complains headache, dizziness, tremors and anxiety. CAT scans of head and neck are unremarkable. But he denies any other bodily injury. Her last alcohol drinking was yesterday. He drinks on daily basis. He was treated with Ativan in the ED. Dr. Cyril Loosen requested admission for seizure induced by alcohol withdrawal and DT.  PAST MEDICAL HISTORY:   Past Medical History  Diagnosis Date  . Seizures (HCC)   . Polysubstance abuse   . Pancreatitis   . Asthma   . Alcohol dependence (HCC)   . Hypertension     PAST SURGICAL HISTORY:   Past Surgical History  Procedure Laterality Date  . No past surgeries      SOCIAL HISTORY:   Social History  Substance Use Topics  . Smoking status: Current Every Day Smoker -- 1.00 packs/day    Types: Cigarettes  . Smokeless tobacco: Never Used  . Alcohol Use: 1.2 oz/week    2 Glasses of wine per week     Comment: daily    FAMILY HISTORY:   Family History  Problem Relation Age of Onset  . Diabetes Mother   . Liver disease Neg Hx   . Colon cancer Neg Hx   . Lung cancer Father     DRUG ALLERGIES:  No Known Allergies  REVIEW OF SYSTEMS:  CONSTITUTIONAL: No fever, generalized weakness.   EYES: No blurred or double vision.  EARS, NOSE, AND THROAT: No tinnitus or ear pain.  RESPIRATORY: Has cough, shortness of breath and wheezing but no hemoptysis.  CARDIOVASCULAR: No chest pain, orthopnea, edema.  GASTROINTESTINAL: No nausea, vomiting, diarrhea or abdominal pain.  GENITOURINARY: No dysuria, hematuria.  ENDOCRINE: No polyuria, nocturia,  HEMATOLOGY: No anemia, easy bruising or bleeding SKIN: No rash or lesion. MUSCULOSKELETAL: No joint pain or arthritis.   NEUROLOGIC: No tingling, numbness, weakness. Had 2 episode of seizure with loss of consciousness. PSYCHIATRY: Has anxiety but no depression.   MEDICATIONS AT HOME:   Prior to Admission medications   Medication Sig Start Date End Date Taking? Authorizing Provider  busPIRone (BUSPAR) 10 MG tablet Take 1 tablet (10 mg total) by mouth 3 (three) times daily. 05/10/15  Yes Audery Amel, MD  carbamazepine (TEGRETOL) 200 MG tablet Take 1 tablet (200 mg total) by mouth 2 (two) times daily. 05/10/15  Yes Audery Amel, MD  folic acid (FOLVITE) 1 MG tablet Take 1 tablet (1 mg total) by mouth daily. 03/23/15  Yes Altamese Dilling, MD  hydrALAZINE (APRESOLINE) 25 MG tablet Take 1 tablet (25 mg total) by mouth 3 (three) times daily. 03/23/15  Yes Altamese Dilling, MD  hydrOXYzine (VISTARIL) 50 MG capsule Take 1 capsule (50 mg total) by mouth 3 (three) times daily as  needed for anxiety. 05/10/15  Yes Audery AmelJohn T Clapacs, MD  Multiple Vitamin (MULTIVITAMIN WITH MINERALS) TABS tablet Take 1 tablet by mouth daily. 03/23/15  Yes Altamese DillingVaibhavkumar Vachhani, MD  acetaminophen (TYLENOL) 325 MG tablet Take 2 tablets (650 mg total) by mouth every 6 (six) hours as needed for mild pain (or Fever >/= 101). Patient not taking: Reported on 03/21/2015 01/06/15   Auburn BilberryShreyang Patel, MD  atenolol (TENORMIN) 50 MG tablet Take 1 tablet (50 mg total) by mouth daily. Patient not taking: Reported on 03/21/2015 01/06/15   Auburn BilberryShreyang Patel, MD  clonazePAM (KLONOPIN) 2 MG  tablet Take 1 tablet (2 mg total) by mouth 2 (two) times daily. Patient not taking: Reported on 03/21/2015 01/06/15   Auburn BilberryShreyang Patel, MD  ondansetron [facility](ZOFRAN) 4 MG tablet Take 1-2 tabs by mouth every 8 hours as needed for nausea/vomiting Patient not taking: Reported on 03/21/2015 03/20/15   Loleta Roseory Forbach, MD  oxyCODONE-acetaminophen (PERCOCET/ROXICET) 5-325 MG per tablet Take 1 tablet by mouth every 4 (four) hours as needed for moderate pain. Patient not taking: Reported on 05/22/2015 03/23/15   Altamese DillingVaibhavkumar Vachhani, MD  pantoprazole (PROTONIX) 40 MG tablet Take 1 tablet (40 mg total) by mouth 2 (two) times daily. Patient not taking: Reported on 05/22/2015 03/23/15   Altamese DillingVaibhavkumar Vachhani, MD  potassium chloride SA (KLOR-CON M20) 20 MEQ tablet Take 1 tablet (20 mEq total) by mouth 2 (two) times daily. Patient not taking: Reported on 03/21/2015 03/20/15   Loleta Roseory Forbach, MD      VITAL SIGNS:  Blood pressure 148/100, pulse 79, temperature 98.1 F (36.7 C), temperature source Oral, resp. rate 16, height 5\' 11"  (1.803 m), weight 79.379 kg (175 lb), SpO2 100 %.  PHYSICAL EXAMINATION:  GENERAL:  29 y.o.-year-old patient lying in the bed with no acute distress.  EYES: Pupils equal, round, reactive to light and accommodation. No scleral icterus. Extraocular muscles intact.  HEENT: Head atraumatic, normocephalic. Oropharynx and nasopharynx clear. Mild skin lacerations on the head. NECK:  Supple, no jugular venous distention. No thyroid enlargement, no tenderness.  LUNGS: Normal breath sounds bilaterally, no wheezing, rales,rhonchi or crepitation. No use of accessory muscles of respiration.  CARDIOVASCULAR: S1, S2 normal. No murmurs, rubs, or gallops.  ABDOMEN: Soft, nontender, nondistended. Bowel sounds present. No organomegaly or mass.  EXTREMITIES: No pedal edema, cyanosis, or clubbing.  NEUROLOGIC: Cranial nerves II through XII are intact. Muscle strength 5/5 in all extremities. Sensation intact. Gait not  checked. Tremor both hands. PSYCHIATRIC: The patient is alert and oriented x 3.  SKIN: No obvious rash, lesion, or ulcer.   LABORATORY PANEL:   CBC  Recent Labs Lab 05/22/15 0939  WBC 6.6  HGB 11.6*  HCT 33.5*  PLT 223   ------------------------------------------------------------------------------------------------------------------  Chemistries   Recent Labs Lab 05/22/15 0939  NA 137  K 3.1*  CL 99*  CO2 29  GLUCOSE 109*  BUN 7  CREATININE 0.80  CALCIUM 9.0  AST 741*  ALT 139*  ALKPHOS 89  BILITOT 1.1   ------------------------------------------------------------------------------------------------------------------  Cardiac Enzymes No results for input(s): TROPONINI in the last 168 hours. ------------------------------------------------------------------------------------------------------------------  RADIOLOGY:  Ct Head Wo Contrast  05/22/2015  CLINICAL DATA:  Recent seizure activity with confusion and neck pain, initial encounter EXAM: CT HEAD WITHOUT CONTRAST CT CERVICAL SPINE WITHOUT CONTRAST TECHNIQUE: Multidetector CT imaging of the head and cervical spine was performed following the standard protocol without intravenous contrast. Multiplanar CT image reconstructions of the cervical spine were also generated. COMPARISON:  None. FINDINGS: CT HEAD FINDINGS Bony  calvarium is intact. No findings to suggest acute hemorrhage, acute infarction or space-occupying mass lesion are noted. CT CERVICAL SPINE FINDINGS Seven cervical segments are well visualized. Vertebral body height is well maintained. No acute fracture or acute facet abnormality is seen. The odontoid is within normal limits. Minimal osteophytic changes are noted. No gross soft tissue abnormality is seen. IMPRESSION: CT of the head:  No acute intracranial abnormality noted. CT of the cervical spine: Minimal degenerative change. No acute abnormality noted. Electronically Signed   By: Alcide Clever M.D.   On:  05/22/2015 10:56   Ct Cervical Spine Wo Contrast  05/22/2015  CLINICAL DATA:  Recent seizure activity with confusion and neck pain, initial encounter EXAM: CT HEAD WITHOUT CONTRAST CT CERVICAL SPINE WITHOUT CONTRAST TECHNIQUE: Multidetector CT imaging of the head and cervical spine was performed following the standard protocol without intravenous contrast. Multiplanar CT image reconstructions of the cervical spine were also generated. COMPARISON:  None. FINDINGS: CT HEAD FINDINGS Bony calvarium is intact. No findings to suggest acute hemorrhage, acute infarction or space-occupying mass lesion are noted. CT CERVICAL SPINE FINDINGS Seven cervical segments are well visualized. Vertebral body height is well maintained. No acute fracture or acute facet abnormality is seen. The odontoid is within normal limits. Minimal osteophytic changes are noted. No gross soft tissue abnormality is seen. IMPRESSION: CT of the head:  No acute intracranial abnormality noted. CT of the cervical spine: Minimal degenerative change. No acute abnormality noted. Electronically Signed   By: Alcide Clever M.D.   On: 05/22/2015 10:56    EKG:   Orders placed or performed during the hospital encounter of 05/09/15  . ED EKG  . ED EKG  . EKG    IMPRESSION AND PLAN:   Seizure due to alcohol withdrawal Alcohol abuse Hypokalemia Abnormal liver function test, possible due to alcohol abuse Hypertension Substance abuse Asthma Tobacco abuse  The patient will be admitted to stepdown unit. Start CIWA protocol, banana bag infusion and Ativan when necessary. Continue Tegretol. Keep nothing by mouth except medication the water with IV fluid support. Seizure, Aspiration and fall precaution. Give potassium supplement and follow-up BMP. Check magnesium level. DuoNeb when necessary for wheezing and shortness of breath. Smoking cessation was counseled for 3-4 minutes and given nicotine patch.  All the records are reviewed and case  discussed with ED provider. Management plans discussed with the patient, family and they are in agreement.  CODE STATUS: Full code  TOTAL TIME TAKING CARE OF THIS PATIENT: 56  minutes.    Shaune Pollack M.D on 05/22/2015 at 12:37 PM  Between 7am to 6pm - Pager - [phone]  After 6pm go to [email or web address] - password [email or web address] [facility]  [facility] [place] [facility]  Office  [phone]  CC: Primary care physician; Evelene Croon, MD

## 2015-05-22 NOTE — BH Assessment (Signed)
Assessment Note  Johnathan Rivera is an 29 y.o. male who presents to the ER, via EMS, after having a seizure, at this mother's home. His first seizure, was 08/2014. He's had a total of four seizures, since February and all were due to his alcohol use.  Patient states, he's been drinking on a daily a basis, in the amount of 1 to 2 wine a bottles. He was recently inpatient with [facility], approximately 2 months ago. He successfully completed the program and upon discharged, didn't follow up with any outpatient treatment. Thus, he relapsed within a short time frame.    Patient denies SI/HI and AV/H.  He have no history of aggression or violence. He has no involvement with the legal system; no upcoming court dates, pending charges or probation.  Patient express a desire to go inpatient for detox and ongoing treatment. However, if their are no beds available, with any facility, he is open to outpatient treatment.  Diagnosis: Alcohol Use Disorder; Severe  Past Medical History:  Past Medical History  Diagnosis Date  . Seizures (HCC)   . Polysubstance abuse   . Pancreatitis   . Asthma   . Alcohol dependence (HCC)   . Hypertension     Past Surgical History  Procedure Laterality Date  . No past surgeries      Family History:  Family History  Problem Relation Age of Onset  . Diabetes Mother   . Liver disease Neg Hx   . Colon cancer Neg Hx   . Lung cancer Father     Social History:  reports that he has been smoking Cigarettes.  He has been smoking about 1.00 pack per day. He has never used smokeless tobacco. He reports that he drinks about 1.2 oz of alcohol per week. He reports that he uses illicit drugs (Marijuana and Benzodiazepines).  Additional Social History:  Alcohol / Drug Use Pain Medications: See PTA Prescriptions: See PTA Over the Counter: See PTA History of alcohol / drug use?: Yes Longest period of sobriety (when/how long): Two weeks Negative Consequences of Use:  Personal relationships, Work / Programmer, multimedia, Surveyor, quantity (Health problems & History Seizures ) Withdrawal Symptoms: Seizures, Sweats, Nausea / Vomiting, Tremors Onset of Seizures: 08/17/2014 Date of most recent seizure: 05/22/2015 Substance #1 Name of Substance 1: Alcohol 1 - Age of First Use: 13 1 - Amount (size/oz): 1 to 2 pints of Wine 1 - Frequency: Daily 1 - Duration: Two Years 1 - Last Use / Amount: 05/21/2015 Substance #2 Name of Substance 2: THC 2 - Age of First Use: 15 2 - Amount (size/oz): "A joint" 2 - Frequency: Once a week 2 - Duration: 15 years "On and off." 2 - Last Use / Amount: 05/20/2015  CIWA: CIWA-Ar BP: (!) 148/91 mmHg Pulse Rate: 76 COWS:    Allergies: No Known Allergies  Home Medications:  (Not in a hospital admission)  OB/GYN Status:  No LMP for male patient.  General Assessment Data Location of Assessment: [facility] ED TTS Assessment: In system Is this a Tele or Face-to-Face Assessment?: Face-to-Face Is this an Initial Assessment or a Re-assessment for this encounter?: Initial Assessment Marital status: Separated Maiden name: n/a Is patient pregnant?: No Pregnancy Status: No Living Arrangements: Parent Can pt return to current living arrangement?: Yes Admission Status: Voluntary Is patient capable of signing voluntary admission?: Yes Referral Source: Self/Family/Friend Insurance type: None  Medical Screening Exam [facility] Walk-in ONLY) Medical Exam completed: Yes  Crisis Care Plan Living Arrangements: Parent Name of Psychiatrist:  None Name of Therapist: None  Education Status Is patient currently in school?: No Current Grade: n/a Highest grade of school patient has completed: [employer] Name of school: [employer] Contact person: n/a  Risk to self with the past 6 months Suicidal Ideation: No Has patient been a risk to self within the past 6 months prior to admission? : No Suicidal Intent: No Has patient had any suicidal intent within  the past 6 months prior to admission? : No Is patient at risk for suicide?: No Suicidal Plan?: No Has patient had any suicidal plan within the past 6 months prior to admission? : No Access to Means: No What has been your use of drugs/alcohol within the last 12 months?: Alcohol & THC use Previous Attempts/Gestures: No How many times?: 0 Other Self Harm Risks: Active Addiction Triggers for Past Attempts: None known Intentional Self Injurious Behavior: None Family Suicide History: No Recent stressful life event(s): Other (Comment), Loss (Comment) (Active Addiction) Persecutory voices/beliefs?: No Depression: Yes Depression Symptoms: Guilt, Loss of interest in usual pleasures, Tearfulness, Isolating, Fatigue, Feeling worthless/self pity Substance abuse history and/or treatment for substance abuse?: Yes Suicide prevention information given to non-admitted patients: Not applicable  Risk to Others within the past 6 months Homicidal Ideation: No Does patient have any lifetime risk of violence toward others beyond the six months prior to admission? : No Thoughts of Harm to Others: No Current Homicidal Intent: No Current Homicidal Plan: No Access to Homicidal Means: No Identified Victim: None Reported History of harm to others?: No Assessment of Violence: None Noted Violent Behavior Description: None Reported or Noted Does patient have access to weapons?: No Criminal Charges Pending?: No Does patient have a court date: No Is patient on probation?: No  Psychosis Hallucinations: Auditory, Visual, Tactile (History of Hallucinations when in detox) Delusions: None noted  Mental Status Report Appearance/Hygiene: Poor hygiene, Body odor, Disheveled Eye Contact: Poor Motor Activity: Unable to assess (Patient lying in bed) Speech: Logical/coherent Level of Consciousness: Drowsy Mood: Anxious, Depressed, Sad, Guilty, Pleasant Affect: Anxious, Appropriate to circumstance, Depressed,  Sad Anxiety Level: Minimal Thought Processes: Coherent, Relevant Judgement: Impaired Orientation: Person, Time, Place, Situation, Appropriate for developmental age Obsessive Compulsive Thoughts/Behaviors: Minimal  Cognitive Functioning Concentration: Normal Memory: Recent Intact, Remote Intact IQ: Average Insight: Fair Impulse Control: Poor Appetite: Poor ("Horrible" due to drinking) Weight Loss:  (Unknown) Weight Gain: 0 Sleep: Decreased Total Hours of Sleep: 4 (He has to be "really tired or buzzed to go to sleep.") Vegetative Symptoms: None  ADLScreening [facility] Assessment Services) Patient's cognitive ability adequate to safely complete daily activities?: Yes Patient able to express need for assistance with ADLs?: Yes Independently performs ADLs?: Yes (appropriate for developmental age)  Prior Inpatient Therapy Prior Inpatient Therapy: Yes Prior Therapy Dates: 03/2015 Prior Therapy Facilty/Provider(s): [facility] Reason for Treatment: Alcohol Abuse  Prior Outpatient Therapy Prior Outpatient Therapy: No Prior Therapy Dates: n/a Prior Therapy Facilty/Provider(s): n/a Reason for Treatment: n/a Does patient have an ACCT team?: No Does patient have Intensive In-House Services?  : No Does patient have [facility] services? : No Does patient have P4CC services?: No  ADL Screening (condition at time of admission) Patient's cognitive ability adequate to safely complete daily activities?: Yes Is the patient deaf or have difficulty hearing?: No Does the patient have difficulty seeing, even when wearing glasses/contacts?: No Does the patient have difficulty concentrating, remembering, or making decisions?: No Patient able to express need for assistance with ADLs?: Yes Does the patient have difficulty dressing or  bathing?: No Independently performs ADLs?: Yes (appropriate for developmental age) Does the patient have difficulty walking or climbing stairs?: No Weakness of Legs:  None Weakness of Arms/Hands: None  Home Assistive Devices/Equipment Home Assistive Devices/Equipment: None  Therapy Consults (therapy consults require a physician order) PT Evaluation Needed: No OT Evalulation Needed: No SLP Evaluation Needed: No Abuse/Neglect Assessment (Assessment to be complete while patient is alone) Physical Abuse: Denies Verbal Abuse: Denies Sexual Abuse: Denies Exploitation of patient/patient's resources: Denies Self-Neglect: Denies Values / Beliefs Cultural Requests During Hospitalization: None Spiritual Requests During Hospitalization: None Consults Spiritual Care Consult Needed: No Social Work Consult Needed: No Merchant navy officer (For Healthcare) Does patient have an advance directive?: No Would patient like information on creating an advanced directive?: No - patient declined information    Additional Information 1:1 In Past 12 Months?: No CIRT Risk: No Elopement Risk: No Does patient have medical clearance?: Yes  Child/Adolescent Assessment Running Away Risk: Denies (Patient is an adult)  Disposition:  Disposition Initial Assessment Completed for this Encounter: Yes Disposition of Patient: Other dispositions Type of outpatient treatment: Adult Other disposition(s): Other (Comment) (Psych MD to see)  On Site Evaluation by:   Reviewed with Physician:     Lilyan Gilford, MS, LCAS, LPC, NCC, CCSI 05/22/2015 12:00 PM

## 2015-05-22 NOTE — Plan of Care (Signed)
Problem: Education: Goal: Knowledge of [facility] Education information/materials will improve Outcome: Progressing Pt says he drinks 2 pints a day, but says he usually drinks more when he's with his friends. Lives at home with his mother.  IVF infusing. Banana bag infusing.  Patient not requiring and medicine at this time per CIWA-AR protocol.  Problem: Safety: Goal: Ability to remain free from injury will improve Outcome: Progressing High fall risk, bed alarm on. Calls out for needs. Standby assist. Seizure precautions in place. Padded side rails.     Problem: Health Behavior/Discharge Planning: Goal: Ability to manage health-related needs will improve Outcome: Completed/Met Date Met:  05/22/15 Consult to [place] ordered.

## 2015-05-22 NOTE — BH Assessment (Signed)
Discussed patient with ER MD (Dr. Cyril LoosenKinner), patient is going to be admitted to the Medical Floor.  Provided the patient with Outpatient Referral Information ([facility]).  Also gave the patient, contact information for [facility] Peer Support Specialist Lorella Nimrod([place] [phone]). Called Peer Support, while in patient's room, so they can meet via phone. Patient gave peer support his contact information . Patient provided verbal consent before the phone call was made.  Patient unable to go to [facility] due to his seizures.

## 2015-05-22 NOTE — ED Notes (Signed)
Patient arrived by EMS from home after mother witnessing X2 seizures. Patient reports he has not taken/ got prescription filled in a couple of weeks for seizure meds. Patient has bump on head from falling and small laceration on R forehead and small laceration under Left eye from fall. Patient has HX of alcohol problem

## 2015-05-22 NOTE — ED Provider Notes (Signed)
[facility] Emergency Department Provider Note  ____________________________________________  Time seen: On arrival  I have reviewed the triage vital signs and the nursing notes.   HISTORY  Chief Complaint Seizures    HPI Johnathan Rivera is a 29 y.o. male who presents after a seizure 2 at home. He reports he struck his head on the wall and bit his tongue. He has not been on anything for seizures and does admit that he believes these are alcohol withdrawal seizures. He last drank yesterday a small amount. Review of records demonstrates that the patient was in our ED earlier this month for alcohol withdrawal. He denies other injuries. Currently he feels well. He denies feeling shaky     Past Medical History  Diagnosis Date  . Seizures (HCC)   . Polysubstance abuse   . Pancreatitis   . Asthma   . Alcohol dependence (HCC)   . Hypertension     Patient Active Problem List   Diagnosis Date Noted  . Seizure (HCC) 05/22/2015  . Delirium tremens (HCC) 05/22/2015  . Peripheral neuropathy (HCC) 05/10/2015  . Seizure disorder (HCC) 05/10/2015  . Acute alcoholic pancreatitis 03/21/2015  . Hypokalemia 03/21/2015  . Hypomagnesemia 03/21/2015  . Alcohol abuse 01/04/2015  . Alcohol withdrawal seizure (HCC) 01/04/2015  . Anxiety 01/04/2015  . Alcoholic hepatitis 01/04/2015  . Alcohol dependence (HCC) 01/04/2015  . Elevated LFTs   . Polysubstance abuse     Past Surgical History  Procedure Laterality Date  . No past surgeries      Current Outpatient Rx  Name  Route  Sig  Dispense  Refill  . busPIRone (BUSPAR) 10 MG tablet   Oral   Take 1 tablet (10 mg total) by mouth 3 (three) times daily.   90 tablet   0   . carbamazepine (TEGRETOL) 200 MG tablet   Oral   Take 1 tablet (200 mg total) by mouth 2 (two) times daily.   60 tablet   0   . folic acid (FOLVITE) 1 MG tablet   Oral   Take 1 tablet (1 mg total) by mouth daily.   30 tablet   0    . hydrALAZINE (APRESOLINE) 25 MG tablet   Oral   Take 1 tablet (25 mg total) by mouth 3 (three) times daily.   90 tablet   0   . hydrOXYzine (VISTARIL) 50 MG capsule   Oral   Take 1 capsule (50 mg total) by mouth 3 (three) times daily as needed for anxiety.   60 capsule   0   . Multiple Vitamin (MULTIVITAMIN WITH MINERALS) TABS tablet   Oral   Take 1 tablet by mouth daily.   30 tablet   0   . acetaminophen (TYLENOL) 325 MG tablet   Oral   Take 2 tablets (650 mg total) by mouth every 6 (six) hours as needed for mild pain (or Fever >/= 101). Patient not taking: Reported on 03/21/2015   1 tablet   0   . atenolol (TENORMIN) 50 MG tablet   Oral   Take 1 tablet (50 mg total) by mouth daily. Patient not taking: Reported on 03/21/2015   30 tablet   0   . clonazePAM (KLONOPIN) 2 MG tablet   Oral   Take 1 tablet (2 mg total) by mouth 2 (two) times daily. Patient not taking: Reported on 03/21/2015   30 tablet   0   . ondansetron (ZOFRAN) 4 MG tablet  Take 1-2 tabs by mouth every 8 hours as needed for nausea/vomiting Patient not taking: Reported on 03/21/2015   30 tablet   0   . oxyCODONE-acetaminophen (PERCOCET/ROXICET) 5-325 MG per tablet   Oral   Take 1 tablet by mouth every 4 (four) hours as needed for moderate pain. Patient not taking: Reported on 05/22/2015   20 tablet   0   . pantoprazole (PROTONIX) 40 MG tablet   Oral   Take 1 tablet (40 mg total) by mouth 2 (two) times daily. Patient not taking: Reported on 05/22/2015   60 tablet   0   . potassium chloride SA (KLOR-CON M20) 20 MEQ tablet   Oral   Take 1 tablet (20 mEq total) by mouth 2 (two) times daily. Patient not taking: Reported on 03/21/2015   14 tablet   0     Allergies Review of patient's allergies indicates no known allergies.  Family History  Problem Relation Age of Onset  . Diabetes Mother   . Liver disease Neg Hx   . Colon cancer Neg Hx   . Lung cancer Father     Social  History Social History  Substance Use Topics  . Smoking status: Current Every Day Smoker -- 1.00 packs/day    Types: Cigarettes  . Smokeless tobacco: Never Used  . Alcohol Use: 1.2 oz/week    2 Glasses of wine per week     Comment: daily    Review of Systems  Constitutional: Negative for fever. Eyes: Negative for visual changes. ENT: Negative for sore throat Cardiovascular: Negative for chest pain. Respiratory: Negative for shortness of breath. Gastrointestinal: Negative for abdominal pain, vomiting and diarrhea. Genitourinary: Negative for dysuria. Musculoskeletal: Negative for back pain. Skin: Negative for rash. Positive for superficial laceration  Neurological: Negative for headaches or focal weakness Psychiatric: No anxiety    ____________________________________________   PHYSICAL EXAM:  VITAL SIGNS: ED Triage Vitals  Enc Vitals Group     BP 05/22/15 0850 152/102 mmHg     Pulse Rate 05/22/15 0850 98     Resp 05/22/15 0900 14     Temp 05/22/15 0850 98.1 F (36.7 C)     Temp Source 05/22/15 0850 Oral     SpO2 05/22/15 0850 99 %     Weight 05/22/15 0850 175 lb (79.379 kg)     Height 05/22/15 0850  (1.803 m)     Head Cir --      Peak Flow --      Pain Score 05/22/15 0851 7     Pain Loc --      Pain Edu? --      Excl. in GC? --      Constitutional: Alert and oriented. Well appearing and in no distress. Eyes: Conjunctivae are normal.  ENT   Head: Normocephalic but with contusion to top of head   Mouth/Throat: Mucous membranes are moist. Superficial bite marks to the tongue Cardiovascular: Normal rate, regular rhythm. Normal and symmetric distal pulses are present in all extremities. No murmurs, rubs, or gallops. Respiratory: Normal respiratory effort without tachypnea nor retractions. Breath sounds are clear and equal bilaterally.  Gastrointestinal: Soft and non-tender in all quadrants. No distention. There is no CVA tenderness. Genitourinary:  deferred Musculoskeletal: Nontender with normal range of motion in all extremities. No lower extremity tenderness nor edema. Neurologic:  Normal speech and language. No gross focal neurologic deficits are appreciated. Skin:  Skin is warm, dry.. No rash noted. Superficial laceration to forehead Psychiatric:  Mood and affect are normal. Patient exhibits appropriate insight and judgment.  ____________________________________________    LABS (pertinent positives/negatives)  Labs Reviewed  CBC - Abnormal; Notable for the following:    RBC 3.59 (*)    Hemoglobin 11.6 (*)    HCT 33.5 (*)    RDW 15.4 (*)    All other components within normal limits  COMPREHENSIVE METABOLIC PANEL - Abnormal; Notable for the following:    Potassium 3.1 (*)    Chloride 99 (*)    Glucose, Bld 109 (*)    AST 741 (*)    ALT 139 (*)    All other components within normal limits  ETHANOL    ____________________________________________   EKG  None  ____________________________________________    RADIOLOGY I have personally reviewed any xrays that were ordered on this patient: CT head and cervical spine are unremarkable  ____________________________________________   PROCEDURES  Procedure(s) performed: none  Critical Care performed: none  ____________________________________________   INITIAL IMPRESSION / ASSESSMENT AND PLAN / ED COURSE  Pertinent labs & imaging results that were available during my care of the patient were reviewed by me and considered in my medical decision making (see chart for details).  Patient with likely alcohol withdrawal seizure. We will give 1 mg of Ativan IV obtain CT head and cervical spine and reevaluate  Given elevated AST/ALT and alcohol withdrawal seizure and will admit for further workup  ____________________________________________   FINAL CLINICAL IMPRESSION(S) / ED DIAGNOSES  Final diagnoses:  Alcohol withdrawal seizure, uncomplicated (HCC)      Jene Every, MD 05/22/15 1217

## 2015-05-23 DIAGNOSIS — F19980 Other psychoactive substance use, unspecified with psychoactive substance-induced anxiety disorder: Secondary | ICD-10-CM

## 2015-05-23 DIAGNOSIS — F1010 Alcohol abuse, uncomplicated: Secondary | ICD-10-CM

## 2015-05-23 LAB — CBC
HCT: 32 % — ABNORMAL LOW (ref 40.0–52.0)
HEMOGLOBIN: 11.3 g/dL — AB (ref 13.0–18.0)
MCH: 33.2 pg (ref 26.0–34.0)
MCHC: 35.4 g/dL (ref 32.0–36.0)
MCV: 93.7 fL (ref 80.0–100.0)
PLATELETS: 197 10*3/uL (ref 150–440)
RBC: 3.41 MIL/uL — AB (ref 4.40–5.90)
RDW: 15.8 % — ABNORMAL HIGH (ref 11.5–14.5)
WBC: 7.7 10*3/uL (ref 3.8–10.6)

## 2015-05-23 LAB — BASIC METABOLIC PANEL
ANION GAP: 6 (ref 5–15)
ANION GAP: 8 (ref 5–15)
BUN: <5 mg/dL — ABNORMAL LOW (ref 6–20)
BUN: <5 mg/dL — ABNORMAL LOW (ref 6–20)
CHLORIDE: 102 mmol/L (ref 101–111)
CHLORIDE: 102 mmol/L (ref 101–111)
CO2: 29 mmol/L (ref 22–32)
CO2: 26 mmol/L (ref 22–32)
CREATININE: 0.77 mg/dL (ref 0.61–1.24)
Calcium: 8.5 mg/dL — ABNORMAL LOW (ref 8.9–10.3)
Calcium: 8.7 mg/dL — ABNORMAL LOW (ref 8.9–10.3)
Creatinine, Ser: 0.72 mg/dL (ref 0.61–1.24)
GFR calc Af Amer: >60 mL/min (ref >60)
GFR calc non Af Amer: >60 mL/min (ref >60)
GLUCOSE: 166 mg/dL — AB (ref 65–99)
Glucose, Bld: 94 mg/dL (ref 65–99)
POTASSIUM: 3.2 mmol/L — AB (ref 3.5–5.1)
Potassium: 2.7 mmol/L — CL (ref 3.5–5.1)
SODIUM: 137 mmol/L (ref 135–145)
SODIUM: 136 mmol/L (ref 135–145)

## 2015-05-23 MED ORDER — POTASSIUM CHLORIDE CRYS ER 20 MEQ PO TBCR
40.0000 meq | EXTENDED_RELEASE_TABLET | Freq: Two times a day (BID) | ORAL | Status: DC
  Administered 2015-05-23 – 2015-05-24 (×3): 40 meq via ORAL
  Filled 2015-05-23 (×3): qty 2

## 2015-05-23 MED ORDER — NICOTINE 10 MG IN INHA
1.0000 | RESPIRATORY_TRACT | Status: DC | PRN
  Filled 2015-05-23: qty 36

## 2015-05-23 MED ORDER — LORAZEPAM 2 MG/ML IJ SOLN: 1.0000 mg | Freq: Four times a day (QID) | INTRAMUSCULAR | Status: DC | PRN

## 2015-05-23 MED ORDER — LORAZEPAM 1 MG PO TABS: 1.0000 mg | ORAL_TABLET | Freq: Four times a day (QID) | ORAL | Status: DC | PRN

## 2015-05-23 MED ORDER — POTASSIUM CHLORIDE CRYS ER 20 MEQ PO TBCR
40.0000 meq | EXTENDED_RELEASE_TABLET | Freq: Once | ORAL | Status: AC
  Administered 2015-05-23: 40 meq via ORAL
  Filled 2015-05-23: qty 2

## 2015-05-23 MED ORDER — IBUPROFEN 400 MG PO TABS
800.0000 mg | ORAL_TABLET | Freq: Three times a day (TID) | ORAL | Status: DC | PRN
  Administered 2015-05-23: 06:00:00 800 mg via ORAL
  Filled 2015-05-23: qty 2

## 2015-05-23 NOTE — Plan of Care (Signed)
Problem: Education: Goal: Knowledge of [facility] Education information/materials will improve Outcome: Progressing Pt has minor tremors, no signs of withdrawal. Initial banana bag continiues, CIWA score of 2 at this time, no medication needed at this timeDenies any pain, discomfort.  Problem: Safety: Goal: Ability to remain free from injury will improve Outcome: Progressing Pt is a high fall risk, encouraged to call for assistance. Resting quietly.

## 2015-05-23 NOTE — Consult Note (Signed)
[place] Consult   Reason for Consult:  Consult for this 29 year old man with a history of alcohol abuse who is currently in the hospital after having alcohol withdrawal seizures Referring Physician:  Volanda Napoleon Patient Identification: Johnathan Rivera MRN:  [id number] Principal Diagnosis: Alcohol abuse Diagnosis:   Patient Active Problem List   Diagnosis Date Noted  . 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 Seizure ([place]) [R56.9] 05/22/2015  . Alcohol withdrawal ([place]) [F10.239] 05/22/2015  . Peripheral neuropathy ([place]) [G62.9] 05/10/2015  . Seizure disorder ([place]) [J62.836] 05/10/2015  . Acute alcoholic pancreatitis [O29.4] 03/21/2015  . Hypokalemia [E87.6] 03/21/2015  . Hypomagnesemia [E83.42] 03/21/2015  . Alcohol abuse [F10.10] 01/04/2015  . Alcohol withdrawal seizure ([place]) [T65.465, R56.9] 01/04/2015  . Anxiety [F41.9] 01/04/2015  . Alcoholic hepatitis [K35.46] 01/04/2015  . Alcohol dependence ([place]) [F10.20] 01/04/2015  . Elevated LFTs [R79.89]   . Polysubstance abuse [F19.10]     Total Time spent with patient: 1 hour  Subjective:   Johnathan Rivera is a 29 y.o. male patient admitted with "I had 2 seizures".  HPI:  Information from the patient and the chart. Patient interviewed. Chart reviewed including old notes. Current labs and medications reviewed. Patient states that yesterday he believes he had 2 seizures witnessed by his family. These are now the fifth total seizure that he has had related to alcohol withdrawal. He had last been drinking he thinks on Saturday. Today he is feeling a little bit shaky but is not having any hallucinations and is not feeling sick to his stomach. Patient says his mood is a little nervous but not depressed. Denies having any hallucinations. Denies suicidal ideation. Patient states that he had been drinking a couple of pints of fortified wine per day. Also uses marijuana intermittently. It has been a few  months since he has maintained any sobriety. He was not actively going to outpatient treatment. Patient is being treated with BuSpar and Vistaril for psychiatric illness as an outpatient. Has gone to [facility]. Patient states that he has ongoing stress related to being out of work and having gone through a divorce earlier this year.  Social history: Patient is not currently working. Was last working in the spring time when he was employed at [employer]. He lives with his mother. He has a 68-year-old child and is divorced.  Medical history: Patient has high blood pressure and takes hydrochlorothiazide. Also has now history of seizures which are thought to be alcohol withdrawal related. Also has a peripheral neuropathy with tingling and discomfort in his feet primarily and a history of past pancreatitis all related to alcohol.  Substance abuse history: Has been drinking for years but first started recognizing it was a problem about a year ago. Has been to rehabilitation treatments on 2 occasions. Longest sobriety has only been about 3 or 4 weeks. Uses marijuana intermittently. Has had hallucinations during withdrawal in the past but it's not clear that it has gotten to the point of full blown delirium tremens  Past Psychiatric History: Patient has never had inpatient psychiatric treatment separate from detox or rehabilitation treatment. Has been treated outpatient with BuSpar for anxiety. No history of suicide attempts or violence. No history of psychotic disorder.  Risk to Self: Suicidal Ideation: No Suicidal Intent: No Is patient at risk for suicide?: No Suicidal Plan?: No Access to Means: No What has been your use of drugs/alcohol within the last 12 months?: Alcohol & THC use How many times?: 0 Other Self Harm  Risks: Active Addiction Triggers for Past Attempts: None known Intentional Self Injurious Behavior: None Risk to Others: Homicidal Ideation: No Thoughts of Harm to Others: No Current Homicidal  Intent: No Current Homicidal Plan: No Access to Homicidal Means: No Identified Victim: None Reported History of harm to others?: No Assessment of Violence: None Noted Violent Behavior Description: None Reported or Noted Does patient have access to weapons?: No Criminal Charges Pending?: No Does patient have a court date: No Prior Inpatient Therapy: Prior Inpatient Therapy: Yes Prior Therapy Dates: 03/2015 Prior Therapy Facilty/Provider(s): [facility] Reason for Treatment: Alcohol Abuse Prior Outpatient Therapy: Prior Outpatient Therapy: No Prior Therapy Dates: n/a Prior Therapy Facilty/Provider(s): n/a Reason for Treatment: n/a Does patient have an ACCT team?: No Does patient have Intensive In-House Services?  : No Does patient have [facility] services? : No Does patient have P4CC services?: No  Past Medical History:  Past Medical History  Diagnosis Date  . Seizures ([place])   . Polysubstance abuse   . Pancreatitis   . Asthma   . Alcohol dependence ([place])   . Hypertension     Past Surgical History  Procedure Laterality Date  . No past surgeries     Family History:  Family History  Problem Relation Age of Onset  . Diabetes Mother   . Liver disease Neg Hx   . Colon cancer Neg Hx   . Lung cancer Father    Family Psychiatric  History: Family history positive for alcohol abuse and anxiety problems and his father Social History:  History  Alcohol Use  . 1.2 oz/week  . 2 Glasses of wine per week    Comment: daily     History  Drug Use  . Yes  . Special: Marijuana, Benzodiazepines    Social History   Social History  . Marital Status: Legally Separated    Spouse Name: N/A  . Number of Children: 1  . Years of Education: N/A   Social History Main Topics  . Smoking status: Current Every Day Smoker -- 1.00 packs/day for 10 years    Types: Cigarettes  . Smokeless tobacco: Never Used  . Alcohol Use: 1.2 oz/week    2 Glasses of wine per week     Comment: daily  . Drug Use:  Yes    Special: Marijuana, Benzodiazepines  . Sexual Activity: Yes   Other Topics Concern  . None   Social History Narrative   This with his mother and has one son age 27   Additional Social History:    Pain Medications: See PTA Prescriptions: See PTA Over the Counter: See PTA History of alcohol / drug use?: Yes Longest period of sobriety (when/how long): Two weeks Negative Consequences of Use: Personal relationships, Work / Youth worker, Museum/gallery curator (Health problems & History Seizures ) Withdrawal Symptoms: Seizures, Sweats, Nausea / Vomiting, Tremors Onset of Seizures: 08/17/2014 Date of most recent seizure: 05/22/2015 Name of Substance 1: Alcohol 1 - Age of First Use: 13 1 - Amount (size/oz): 1 to 2 pints of Wine 1 - Frequency: Daily 1 - Duration: Two Years 1 - Last Use / Amount: 05/21/2015 Name of Substance 2: THC 2 - Age of First Use: 15 2 - Amount (size/oz): "A joint" 2 - Frequency: Once a week 2 - Duration: 15 years "On and off." 2 - Last Use / Amount: 05/20/2015                 Allergies:  No Known Allergies  Labs:  Results for orders  placed or performed during the hospital encounter of 05/22/15 (from the past 48 hour(s))  CBC     Status: Abnormal   Collection Time: 05/22/15  9:39 AM  Result Value Ref Range   WBC 6.6 3.8 - 10.6 K/uL   RBC 3.59 (L) 4.40 - 5.90 MIL/uL   Hemoglobin 11.6 (L) 13.0 - 18.0 g/dL   HCT 33.5 (L) 40.0 - 52.0 %   MCV 93.4 80.0 - 100.0 fL   MCH 32.4 26.0 - 34.0 pg   MCHC 34.7 32.0 - 36.0 g/dL   RDW 15.4 (H) 11.5 - 14.5 %   Platelets 223 150 - 440 K/uL  Comprehensive metabolic panel     Status: Abnormal   Collection Time: 05/22/15  9:39 AM  Result Value Ref Range   Sodium 137 135 - 145 mmol/L   Potassium 3.1 (L) 3.5 - 5.1 mmol/L   Chloride 99 (L) 101 - 111 mmol/L   CO2 29 22 - 32 mmol/L   Glucose, Bld 109 (H) 65 - 99 mg/dL   BUN 7 6 - 20 mg/dL   Creatinine, Ser 0.80 0.61 - 1.24 mg/dL   Calcium 9.0 8.9 - 10.3 mg/dL   Total Protein  7.1 6.5 - 8.1 g/dL   Albumin 3.6 3.5 - 5.0 g/dL   AST 741 (H) 15 - 41 U/L   ALT 139 (H) 17 - 63 U/L   Alkaline Phosphatase 89 38 - 126 U/L   Total Bilirubin 1.1 0.3 - 1.2 mg/dL   GFR calc non Af Amer >60 >60 mL/min   GFR calc Af Amer >60 >60 mL/min    Comment: (NOTE) The eGFR has been calculated using the CKD EPI equation. This calculation has not been validated in all clinical situations. eGFR's persistently <60 mL/min signify possible Chronic Kidney Disease.    Anion gap 9 5 - 15  Ethanol     Status: None   Collection Time: 05/22/15  9:39 AM  Result Value Ref Range   Alcohol, Ethyl (B) <5 <5 mg/dL    Comment:        LOWEST DETECTABLE LIMIT FOR SERUM ALCOHOL IS 5 mg/dL FOR MEDICAL PURPOSES ONLY   Magnesium     Status: None   Collection Time: 05/22/15  4:10 PM  Result Value Ref Range   Magnesium 2.0 1.7 - 2.4 mg/dL  Basic metabolic panel     Status: Abnormal   Collection Time: 05/23/15  4:33 AM  Result Value Ref Range   Sodium 137 135 - 145 mmol/L   Potassium 2.7 (LL) 3.5 - 5.1 mmol/L    Comment: CRITICAL RESULT CALLED TO, READ BACK BY AND VERIFIED WITH  C[facility]EN [facility] AT 4709 05/23/15 WDM    Chloride 102 101 - 111 mmol/L   CO2 29 22 - 32 mmol/L   Glucose, Bld 94 65 - 99 mg/dL   BUN <5 (L) 6 - 20 mg/dL   Creatinine, Ser 0.77 0.61 - 1.24 mg/dL   Calcium 8.5 (L) 8.9 - 10.3 mg/dL   GFR calc non Af Amer >60 >60 mL/min   GFR calc Af Amer >60 >60 mL/min    Comment: (NOTE) The eGFR has been calculated using the CKD EPI equation. This calculation has not been validated in all clinical situations. eGFR's persistently <60 mL/min signify possible Chronic Kidney Disease.    Anion gap 6 5 - 15  CBC     Status: Abnormal   Collection Time: 05/23/15  4:33 AM  Result Value Ref Range  WBC 7.7 3.8 - 10.6 K/uL   RBC 3.41 (L) 4.40 - 5.90 MIL/uL   Hemoglobin 11.3 (L) 13.0 - 18.0 g/dL   HCT 32.0 (L) 40.0 - 52.0 %   MCV 93.7 80.0 - 100.0 fL   MCH 33.2 26.0 - 34.0 pg   MCHC 35.4  32.0 - 36.0 g/dL   RDW 15.8 (H) 11.5 - 14.5 %   Platelets 197 150 - 440 K/uL    Current Facility-Administered Medications  Medication Dose Route Frequency Provider Last Rate Last Dose  . 0.9 % NaCl with KCl 20 mEq/ L  infusion   Intravenous Continuous Demetrios Loll, MD 100 mL/hr at 05/23/15 0154    . albuterol (PROVENTIL) (2.5 MG/3ML) 0.083% nebulizer solution 2.5 mg  2.5 mg Nebulization Q2H PRN Demetrios Loll, MD      . busPIRone (BUSPAR) tablet 10 mg  10 mg Oral TID Demetrios Loll, MD   10 mg at 05/23/15 1023  . carbamazepine (TEGRETOL) tablet 200 mg  200 mg Oral BID Demetrios Loll, MD   200 mg at 05/23/15 0740  . enoxaparin (LOVENOX) injection 40 mg  40 mg Subcutaneous Q24H Demetrios Loll, MD   40 mg at 05/22/15 1639  . folic acid (FOLVITE) tablet 1 mg  1 mg Oral Daily Demetrios Loll, MD   1 mg at 05/23/15 1023  . hydrALAZINE (APRESOLINE) tablet 25 mg  25 mg Oral TID Demetrios Loll, MD   25 mg at 05/23/15 1023  . hydrOXYzine (VISTARIL) capsule 50 mg  50 mg Oral TID PRN Demetrios Loll, MD   50 mg at 05/22/15 1640  . ibuprofen (ADVIL,MOTRIN) tablet 800 mg  800 mg Oral Q8H PRN Harrie Foreman, MD   800 mg at 05/23/15 0533  . LORazepam (ATIVAN) tablet 1 mg  1 mg Oral Q6H PRN Lavonia Drafts, MD       Or  . LORazepam (ATIVAN) injection 1 mg  1 mg Intravenous Q6H PRN Lavonia Drafts, MD      . ondansetron [facility]) tablet 4 mg  4 mg Oral Q6H PRN Demetrios Loll, MD       Or  . ondansetron [facility]) injection 4 mg  4 mg Intravenous Q6H PRN Demetrios Loll, MD      . potassium chloride SA (K-DUR,KLOR-CON) CR tablet 40 mEq  40 mEq Oral BID Harrie Foreman, MD   40 mEq at 05/23/15 1023  . thiamine (B-1) injection 100 mg  100 mg Intravenous Daily Demetrios Loll, MD   100 mg at 05/23/15 1023    Musculoskeletal: Strength & Muscle Tone: within normal limits Gait & Station: unsteady Patient leans: N/A  Psychiatric Specialty Exam: Review of Systems  Constitutional: Negative.   HENT: Negative.   Eyes: Negative.   Respiratory: Negative.    Cardiovascular: Negative.   Gastrointestinal: Negative.   Musculoskeletal: Negative.   Skin: Negative.   Neurological: Positive for tremors and sensory change.  Psychiatric/Behavioral: Positive for substance abuse. Negative for depression, suicidal ideas, hallucinations and memory loss. The patient is nervous/anxious. The patient does not have insomnia.     Blood pressure 144/101, pulse 88, temperature 98.6 F (37 C), temperature source Oral, resp. rate 18, height 5' 11"  (1.803 m), weight 79.379 kg (175 lb), SpO2 100 %.Body mass index is 24.42 kg/(m^2).  General Appearance: Casual  Eye Contact::  Good  Speech:  Normal Rate  Volume:  Normal  Mood:  Anxious  Affect:  Appropriate  Thought Process:  Goal Directed  Orientation:  Full (Time, Place,  and Person)  Thought Content:  Negative  Suicidal Thoughts:  No  Homicidal Thoughts:  No  Memory:  Immediate;   Good Recent;   Fair Remote;   Fair  Judgement:  Intact  Insight:  Fair  Psychomotor Activity:  Normal  Concentration:  Good  Recall:  Good  Fund of Knowledge:Good  Language: Good  Akathisia:  No  Handed:  Right  AIMS (if indicated):     Assets:  Communication Skills Desire for Improvement Housing Resilience Social Support  ADL's:  Intact  Cognition: WNL  Sleep:      Treatment Plan Summary: Daily contact with patient to assess and evaluate symptoms and progress in treatment, Medication management and Plan Patient is currently on the medical service for detox. His blood pressure is continuing to run elevated. Mild tremor. No current sign of delirium. He is receiving anticonvulsant medicines and is on the standard detox protocol. His BuSpar has been continued. There is no indication for change to his medication at this time.For ongoing substance abuse issues patient was counseled about alcohol and its medical complications and encouraged to follow-up with outpatient treatment. He has already met with the representative from  [facility] and states that he will be following up with them when he leaves the hospital. No other change to medicine. We will continue to follow as needed.  Disposition: Patient does not meet criteria for psychiatric inpatient admission. Supportive therapy provided about ongoing stressors. Refer to IOP. Discussed crisis plan, support from social network, calling 911, coming to the Emergency Department, and calling Suicide Hotline.  John Clapacs 05/23/2015 12:28 PM

## 2015-05-23 NOTE — Progress Notes (Signed)
Patient had no c/o pain this shift VSS Patient continues on CIWA score of 2 K+ up to 3.2 from 2.7 in early morning Patient calls out to use bathroom, High Fall precautions maintained

## 2015-05-23 NOTE — Clinical Social Work Note (Signed)
Clinical Social Work Assessment  Patient Details  Name: Johnathan Rivera MRN: [id number] Date of Birth: Sep 22, 1985  Date of referral:  05/23/15               Reason for consult:  Substance Use/ETOH Abuse                Permission sought to share information with:    Permission granted to share information::  No   Housing/Transportation Living arrangements for the past 2 months:  Apartment Source of Information:  Patient Patient Interpreter Needed:  None Criminal Activity/Legal Involvement Pertinent to Current Situation/Hospitalization:  No - Comment as needed Significant Relationships:  None Lives with:  Self Do you feel safe going back to the place where you live?  Yes Need for family participation in patient care:  No (Coment)  Care giving concerns:  Pt will need support to stop drinking.    Social Worker assessment / plan:  CSW met with pt to introduced herself and explained role of social work. CSW also explained nature of consult. CSW was consult for current substance abuse which was directly related to admission. Pt reports current use of ETOH and desire to stop. Pt reported that he met with case manager with [facility]. Pt also shared that he plans to go to a half way house at discharge to assist in his recovery. CSW provided information for AA, RHA, and other community resources. Psychiatrist was consulted. CSW will sign off at this time. Please reconsult if a need arises prior to discharge.   Employment status:  Unemployed Forensic scientist:  Self Pay (Medicaid Pending) PT Recommendations:  Not assessed at this time Information / Referral to community resources:  SBIRT, Outpatient Substance Abuse Treatment Options  Patient/Family's Response to care:  Pt ws pleasant and aprpeciative of CSW support.   Patient/Family's Understanding of and Emotional Response to Diagnosis, Current Treatment, and Prognosis:  Pt was accepting of substance abuse resources.   Emotional  Assessment Appearance:  Appears stated age Attitude/Demeanor/Rapport:  Other (Appropriate) Affect (typically observed):  Accepting Orientation:  Oriented to Self, Oriented to Place, Oriented to  Time, Oriented to Situation Alcohol / Substance use:  Alcohol Use Psych involvement (Current and /or in the community):  Yes (Comment)  Discharge Needs  Concerns to be addressed:  Financial / Insurance Concerns, Substance Abuse Concerns Readmission within the last 30 days:  No Current discharge risk:  Substance Abuse Barriers to Discharge:  Barriers Resolved   Darden Dates, LCSW 05/23/2015, 11:55 AM

## 2015-05-23 NOTE — Progress Notes (Signed)
[facility] [place] at [facility]   PATIENT NAME: Johnathan Rivera    MR#:  [id number]  DATE OF BIRTH:  Nov 22, 1985  SUBJECTIVE:  CHIEF COMPLAINT:   Chief Complaint  Patient presents with  . Seizures   Anxious. Shaky.  REVIEW OF SYSTEMS:   Review of Systems  Constitutional: Positive for malaise/fatigue and diaphoresis. Negative for fever.  Respiratory: Negative for shortness of breath.   Cardiovascular: Negative for chest pain and palpitations.  Gastrointestinal: Positive for nausea. Negative for vomiting and abdominal pain.  Genitourinary: Negative for dysuria.  Neurological: Positive for tremors.  Psychiatric/Behavioral: Positive for depression and substance abuse. The patient is nervous/anxious.     DRUG ALLERGIES:  No Known Allergies  VITALS:  Blood pressure 144/90, pulse 90, temperature 99.1 F (37.3 C), temperature source Oral, resp. rate 18, height 5\' 11"  (1.803 m), weight 79.379 kg (175 lb), SpO2 100 %.  PHYSICAL EXAMINATION:  GENERAL:  29 y.o.-year-old patient lying in the bed , diaphoretic, tremulous  EYES: Pupils equal, round, reactive to light and accommodation. No scleral icterus. Extraocular muscles intact.  HEENT: Head atraumatic, normocephalic. Oropharynx and nasopharynx clear.  NECK:  Supple, no jugular venous distention. No thyroid enlargement, no tenderness.  LUNGS: Normal breath sounds bilaterally, no wheezing, rales,rhonchi or crepitation. No use of accessory muscles of respiration.  CARDIOVASCULAR: S1, S2 normal. No murmurs, rubs, or gallops. Tachycardic ABDOMEN: Soft, nontender, nondistended. Bowel sounds present. No organomegaly or mass.  EXTREMITIES: No pedal edema, cyanosis, or clubbing. Also has 2+ NEUROLOGIC: Cranial nerves II through XII are intact. Muscle strength 5/5 in all extremities. Sensation intact. Gait not checked. Tremor PSYCHIATRIC: The patient is alert and oriented x 3. Anxious SKIN: No obvious rash,  lesion, or ulcer.    LABORATORY PANEL:   CBC  Recent Labs Lab 05/23/15 0433  WBC 7.7  HGB 11.3*  HCT 32.0*  PLT 197   ------------------------------------------------------------------------------------------------------------------  Chemistries   Recent Labs Lab 05/22/15 0939 05/22/15 1610  05/23/15 1251  NA 137  --   < > 136  K 3.1*  --   < > 3.2*  CL 99*  --   < > 102  CO2 29  --   < > 26  GLUCOSE 109*  --   < > 166*  BUN 7  --   < > <5*  CREATININE 0.80  --   < > 0.72  CALCIUM 9.0  --   < > 8.7*  MG  --  2.0  --   --   AST 741*  --   --   --   ALT 139*  --   --   --   ALKPHOS 89  --   --   --   BILITOT 1.1  --   --   --   < > = values in this interval not displayed. ------------------------------------------------------------------------------------------------------------------  Cardiac Enzymes No results for input(s): TROPONINI in the last 168 hours. ------------------------------------------------------------------------------------------------------------------  RADIOLOGY:  Ct Head Wo Contrast  05/22/2015  CLINICAL DATA:  Recent seizure activity with confusion and neck pain, initial encounter EXAM: CT HEAD WITHOUT CONTRAST CT CERVICAL SPINE WITHOUT CONTRAST TECHNIQUE: Multidetector CT imaging of the head and cervical spine was performed following the standard protocol without intravenous contrast. Multiplanar CT image reconstructions of the cervical spine were also generated. COMPARISON:  None. FINDINGS: CT HEAD FINDINGS Bony calvarium is intact. No findings to suggest acute hemorrhage, acute infarction or space-occupying mass lesion are noted. CT CERVICAL SPINE FINDINGS  Seven cervical segments are well visualized. Vertebral body height is well maintained. No acute fracture or acute facet abnormality is seen. The odontoid is within normal limits. Minimal osteophytic changes are noted. No gross soft tissue abnormality is seen. IMPRESSION: CT of the head:  No  acute intracranial abnormality noted. CT of the cervical spine: Minimal degenerative change. No acute abnormality noted. Electronically Signed   By: Alcide Clever M.D.   On: 05/22/2015 10:56   Ct Cervical Spine Wo Contrast  05/22/2015  CLINICAL DATA:  Recent seizure activity with confusion and neck pain, initial encounter EXAM: CT HEAD WITHOUT CONTRAST CT CERVICAL SPINE WITHOUT CONTRAST TECHNIQUE: Multidetector CT imaging of the head and cervical spine was performed following the standard protocol without intravenous contrast. Multiplanar CT image reconstructions of the cervical spine were also generated. COMPARISON:  None. FINDINGS: CT HEAD FINDINGS Bony calvarium is intact. No findings to suggest acute hemorrhage, acute infarction or space-occupying mass lesion are noted. CT CERVICAL SPINE FINDINGS Seven cervical segments are well visualized. Vertebral body height is well maintained. No acute fracture or acute facet abnormality is seen. The odontoid is within normal limits. Minimal osteophytic changes are noted. No gross soft tissue abnormality is seen. IMPRESSION: CT of the head:  No acute intracranial abnormality noted. CT of the cervical spine: Minimal degenerative change. No acute abnormality noted. Electronically Signed   By: Alcide Clever M.D.   On: 05/22/2015 10:56    EKG:   Orders placed or performed during the hospital encounter of 05/09/15  . ED EKG  . ED EKG  . EKG    ASSESSMENT AND PLAN:   1. 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 Continue Tegretol and CIWA protocol  2. Alcohol withdrawal: Patient is genuinely interested in stopping drinking. He has seen psychiatry and social work and given resources for outpatient support. Currently still requiring Ativan to control symptoms. Into need thiamine and folate  3. Alcoholic hepatitis: Monitor liver enzymes. Does not meet criteria for steroids at this time.  4. Anxiety and depression: Continue BuSpar and Vistaril.  Psychiatry is following.  5. Hypokalemia: Due to decreased by mouth intake. Replace and monitor   All the records are reviewed and case discussed with Care Management/Social Workerr. Management plans discussed with the patient, family and they are in agreement.  CODE STATUS: Full   TOTAL TIME TAKING CARE OF THIS PATIENT: 25 minutes.  Greater than 50% of time spent in care coordination and counseling. POSSIBLE D/C IN 2-3 DAYS, DEPENDING ON CLINICAL CONDITION.   Elby Showers M.D on 05/23/2015 at 3:04 PM  Between 7am to 6pm - Pager - [phone]  After 6pm go to [email or web address] - password [email or web address] [facility]  [facility]  Office  [phone]  CC: Primary care physician; Evelene Croon, MD

## 2015-05-24 LAB — COMPREHENSIVE METABOLIC PANEL
ALBUMIN: 3.4 g/dL — AB (ref 3.5–5.0)
ALT: 77 U/L — ABNORMAL HIGH (ref 17–63)
ANION GAP: 8 (ref 5–15)
AST: 131 U/L — ABNORMAL HIGH (ref 15–41)
Alkaline Phosphatase: 93 U/L (ref 38–126)
BILIRUBIN TOTAL: 0.7 mg/dL (ref 0.3–1.2)
BUN: <5 mg/dL — ABNORMAL LOW (ref 6–20)
CO2: 27 mmol/L (ref 22–32)
Calcium: 9 mg/dL (ref 8.9–10.3)
Chloride: 104 mmol/L (ref 101–111)
Creatinine, Ser: 0.64 mg/dL (ref 0.61–1.24)
GFR calc non Af Amer: >60 mL/min (ref >60)
GLUCOSE: 101 mg/dL — AB (ref 65–99)
POTASSIUM: 3.3 mmol/L — AB (ref 3.5–5.1)
SODIUM: 139 mmol/L (ref 135–145)
TOTAL PROTEIN: 6.6 g/dL (ref 6.5–8.1)

## 2015-05-24 LAB — CBC
HCT: 33.2 % — ABNORMAL LOW (ref 40.0–52.0)
Hemoglobin: 11.3 g/dL — ABNORMAL LOW (ref 13.0–18.0)
MCH: 32.4 pg (ref 26.0–34.0)
MCHC: 34.1 g/dL (ref 32.0–36.0)
MCV: 94.9 fL (ref 80.0–100.0)
Platelets: 200 10*3/uL (ref 150–440)
RBC: 3.5 MIL/uL — ABNORMAL LOW (ref 4.40–5.90)
RDW: 16.1 % — AB (ref 11.5–14.5)
WBC: 8.5 10*3/uL (ref 3.8–10.6)

## 2015-05-24 MED ORDER — POTASSIUM CHLORIDE CRYS ER 20 MEQ PO TBCR
40.0000 meq | EXTENDED_RELEASE_TABLET | Freq: Once | ORAL | Status: AC
  Administered 2015-05-24: 09:00:00 40 meq via ORAL
  Filled 2015-05-24: qty 2

## 2015-05-24 NOTE — Discharge Summary (Signed)
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 Seizures ([place])   . Polysubstance abuse   . Pancreatitis   . Asthma   . Alcohol dependence ([place])   . Hypertension     HOSPITAL COURSE:  29 y.o. male with a known history of seizure, hypertension, polysubstance abuse and alcohol abuse. The patient was sent by his mother this morning due to seizure episodes. The patient had 2 episode of seizure with whole-body shaking and tongue bite at home on the day of admission. He fell and hit his head with some laceration on the head. He complains headache, dizziness, tremors and anxiety. CAT scans of head and neck are unremarkable.  He was admitted for seizure due to Alcohol withdrawal and alcohol withdrawal symptoms.  Please see Dr. Lianne Moris dictated history and physical for further details.  Patient was placed on CIWA protocol, did not have any further seizure activity while in the hospital.  A cardiac consultation was obtained while in the hospital. For ongoing substance abuse issues patient was counseled about alcohol and its medical complications and encouraged to follow-up with outpatient treatment. He has already met with the representative from [facility] and states that he will be following up with them when he leaves the hospital. No other change to medicine from psychiatry standpoint.   Patient is feeling much better.  He is not requiring any  medications from CIWA and is being discharged back home in stable condition.  He is agreeable with the discharge plan. DISCHARGE CONDITIONS:  good  CONSULTS OBTAINED:  Treatment Team:  Gonzella Lex, MD  DRUG ALLERGIES:  No Known Allergies  DISCHARGE MEDICATIONS:   Current Discharge Medication List    CONTINUE these medications which have NOT CHANGED   Details  busPIRone (BUSPAR) 10 MG tablet Take 1 tablet (10 mg total) by mouth 3 (three) times daily. Qty: 90 tablet, Refills: 0    carbamazepine (TEGRETOL) 200 MG tablet Take 1 tablet (200 mg total) by mouth 2 (two) times daily. Qty: 60 tablet, Refills: 0    folic acid (FOLVITE) 1 MG tablet Take 1 tablet (1 mg total) by mouth daily. Qty: 30 tablet, Refills: 0    hydrALAZINE (APRESOLINE) 25 MG tablet Take 1 tablet (25 mg total) by mouth 3 (three) times daily. Qty: 90 tablet, Refills: 0    hydrOXYzine (VISTARIL) 50 MG capsule Take 1 capsule (50 mg total) by mouth 3 (three) times daily as needed for anxiety. Qty: 60 capsule, Refills: 0    Multiple Vitamin (MULTIVITAMIN WITH MINERALS) TABS tablet Take 1 tablet by mouth daily. Qty: 30 tablet, Refills: 0    acetaminophen (TYLENOL) 325 MG tablet Take 2 tablets (650 mg total) by mouth every 6 (six) hours as needed for mild pain (or Fever >/= 101). Qty: 1 tablet, Refills: 0    atenolol (TENORMIN) 50 MG tablet Take 1 tablet (50 mg total) by mouth daily. Qty: 30 tablet, Refills: 0    clonazePAM (  KLONOPIN) 2 MG tablet Take 1 tablet (2 mg total) by mouth 2 (two) times daily. Qty: 30 tablet, Refills: 0    ondansetron (ZOFRAN) 4 MG tablet Take 1-2 tabs by mouth every 8 hours as needed for nausea/vomiting Qty: 30 tablet, Refills: 0    oxyCODONE-acetaminophen (PERCOCET/ROXICET) 5-325 MG per tablet Take 1 tablet by mouth every 4 (four) hours as needed for moderate pain. Qty: 20 tablet, Refills: 0    pantoprazole (PROTONIX) 40 MG tablet Take 1 tablet (40 mg total) by mouth 2 (two) times  daily. Qty: 60 tablet, Refills: 0    potassium chloride SA (KLOR-CON M20) 20 MEQ tablet Take 1 tablet (20 mEq total) by mouth 2 (two) times daily. Qty: 14 tablet, Refills: 0         DISCHARGE INSTRUCTIONS:    DIET:  Regular diet  DISCHARGE CONDITION:  Good  ACTIVITY:  Activity as tolerated  OXYGEN:  Home Oxygen: No.   Oxygen Delivery: room air  DISCHARGE LOCATION:  home   If you experience worsening of your admission symptoms, develop shortness of breath, life threatening emergency, suicidal or homicidal thoughts you must seek medical attention immediately by calling 911 or calling your MD immediately  if symptoms less severe.  You Must read complete instructions/literature along with all the possible adverse reactions/side effects for all the Medicines you take and that have been prescribed to you. Take any new Medicines after you have completely understood and accpet all the possible adverse reactions/side effects.   Please note  You were cared for by a hospitalist during your hospital stay. If you have any questions about your discharge medications or the care you received while you were in the hospital after you are discharged, you can call the unit and asked to speak with the hospitalist on call if the hospitalist that took care of you is not available. Once you are discharged, your primary care physician will handle any further medical issues. Please note that NO REFILLS for any discharge medications will be authorized once you are discharged, as it is imperative that you return to your primary care physician (or establish a relationship with a primary care physician if you do not have one) for your aftercare needs so that they can reassess your need for medications and monitor your lab values.    On the day of Discharge: VITAL SIGNS:  Blood pressure 132/87, pulse 82, temperature 98.6 F (37 C), temperature source Oral, resp. rate 18, height _0  (1.803 m), weight  79.379 kg (175 lb), SpO2 99 %. PHYSICAL EXAMINATION:  GENERAL:  29 y.o.-year-old patient lying in the bed with no acute distress.  EYES: Pupils equal, round, reactive to light and accommodation. No scleral icterus. Extraocular muscles intact.  HEENT: Head atraumatic, normocephalic. Oropharynx and nasopharynx clear.  NECK:  Supple, no jugular venous distention. No thyroid enlargement, no tenderness.  LUNGS: Normal breath sounds bilaterally, no wheezing, rales,rhonchi or crepitation. No use of accessory muscles of respiration.  CARDIOVASCULAR: S1, S2 normal. No murmurs, rubs, or gallops.  ABDOMEN: Soft, non-tender, non-distended. Bowel sounds present. No organomegaly or mass.  EXTREMITIES: No pedal edema, cyanosis, or clubbing.  NEUROLOGIC: Cranial nerves II through XII are intact. Muscle strength 5/5 in all extremities. Sensation intact. Gait not checked.  PSYCHIATRIC: The patient is alert and oriented x 3.  SKIN: No obvious rash, lesion, or ulcer.  DATA REVIEW:   CBC  Recent Labs Lab 05/24/15 0447  WBC 8.5  HGB 11.3*  HCT 33.2*  PLT 200    Chemistries   Recent Labs Lab 05/22/15 1610  05/24/15 0447  NA  --   < > 139  K  --   < > 3.3*  CL  --   < > 104  CO2  --   < > 27  GLUCOSE  --   < > 101*  BUN  --   < > <5*  CREATININE  --   < > 0.64  CALCIUM  --   < > 9.0  MG 2.0  --   --   AST  --   --  131*  ALT  --   --  77*  ALKPHOS  --   --  93  BILITOT  --   --  0.7  < > = values in this interval not displayed.  Cardiac Enzymes No results for input(s): TROPONINI in the last 168 hours.  Microbiology Results  Results for orders placed or performed during the hospital encounter of 03/21/15  MRSA PCR Screening     Status: None   Collection Time: 03/21/15  9:08 AM  Result Value Ref Range Status   MRSA by PCR NEGATIVE NEGATIVE Final    Comment:        The GeneXpert MRSA Assay (FDA approved for NASAL specimens only), is one component of a comprehensive MRSA  colonization surveillance program. It is not intended to diagnose MRSA infection nor to guide or monitor treatment for MRSA infections.     RADIOLOGY:  Ct Head Wo Contrast  05/22/2015  CLINICAL DATA:  Recent seizure activity with confusion and neck pain, initial encounter EXAM: CT HEAD WITHOUT CONTRAST CT CERVICAL SPINE WITHOUT CONTRAST TECHNIQUE: Multidetector CT imaging of the head and cervical spine was performed following the standard protocol without intravenous contrast. Multiplanar CT image reconstructions of the cervical spine were also generated. COMPARISON:  None. FINDINGS: CT HEAD FINDINGS Bony calvarium is intact. No findings to suggest acute hemorrhage, acute infarction or space-occupying mass lesion are noted. CT CERVICAL SPINE FINDINGS Seven cervical segments are well visualized. Vertebral body height is well maintained. No acute fracture or acute facet abnormality is seen. The odontoid is within normal limits. Minimal osteophytic changes are noted. No gross soft tissue abnormality is seen. IMPRESSION: CT of the head:  No acute intracranial abnormality noted. CT of the cervical spine: Minimal degenerative change. No acute abnormality noted. Electronically Signed   By: Inez Catalina M.D.   On: 05/22/2015 10:56   Ct Cervical Spine Wo Contrast  05/22/2015  CLINICAL DATA:  Recent seizure activity with confusion and neck pain, initial encounter EXAM: CT HEAD WITHOUT CONTRAST CT CERVICAL SPINE WITHOUT CONTRAST TECHNIQUE: Multidetector CT imaging of the head and cervical spine was performed following the standard protocol without intravenous contrast. Multiplanar CT image reconstructions of the cervical spine were also generated. COMPARISON:  None. FINDINGS: CT HEAD FINDINGS Bony calvarium is intact. No findings to suggest acute hemorrhage, acute infarction or space-occupying mass lesion are noted. CT CERVICAL SPINE FINDINGS Seven cervical segments are well visualized. Vertebral body height is  well maintained. No acute fracture or acute facet abnormality is seen. The odontoid is within normal limits. Minimal osteophytic changes are noted. No gross soft tissue abnormality is seen. IMPRESSION: CT of the head:  No acute intracranial abnormality noted. CT of the cervical spine: Minimal degenerative change. No acute abnormality noted. Electronically Signed   By: Inez Catalina M.D.   On: 05/22/2015 10:56     Management plans discussed  with the patient, family and they are in agreement.  CODE STATUS: Full code  TOTAL TIME TAKING CARE OF THIS PATIENT: 55 minutes.    [facility], Finnley Lewis M.D on 05/24/2015 at 8:24 AM  Between 7am to 6pm - Pager - [phone]  After 6pm go to [email or web address] - password [email or web address] [place] [facility]  Office  [phone]  CC: Primary care physician; Lorelee Market, MD

## 2015-05-24 NOTE — Plan of Care (Signed)
Problem: Education: Goal: Knowledge of [facility] Education information/materials will improve Outcome: Progressing Pt is alert and oriented, no c/o pain. CIWA scale continued every six hours, no need for medication intervention at this time. Pt has minor tremors, reports seeing something in the corner of his eyes, like someone was in the room. Continue to assess.  Problem: Safety: Goal: Ability to remain free from injury will improve Outcome: Progressing Pt is a High Fall Risk, calls for assistance when needed. Standby assist to the bathroom. Rails remain padded at this time, hourly rounding.

## 2015-05-24 NOTE — Discharge Instructions (Signed)
Alcohol Withdrawal When a person who drinks a lot of alcohol stops drinking, he or she may go through alcohol withdrawal. Alcohol withdrawal causes problems. It can make you feel:  Tired (fatigued).  Sad (depressed).  Fearful (anxious).  Grouchy (irritable).  Not hungry.  Sick to your stomach (nauseous).  Shaky. It can also make you have:  Nightmares.  Trouble sleeping.  Trouble thinking clearly.  Mood swings.  Clammy skin.  Very bad sweating.  A very fast heartbeat.  Shaking that you cannot control (tremor).  Having a fever.  A fit of movements that you cannot control (seizure).  Confusion.  Throwing up (vomiting).  Feeling or seeing things that are not there (hallucinations). HOME CARE  Take medicines and vitamins only as told by your doctor.  Do not drink alcohol.  Have someone around in case you need help.  Drink enough fluid to keep your pee (urine) clear or pale yellow.  Think about joining a group to help you stop drinking. GET HELP IF:  Your problems get worse.  Your problems do not go away.  You cannot eat or drink without throwing up.  You are having a hard time not drinking alcohol.  You cannot stop drinking alcohol. GET HELP RIGHT AWAY IF:  You feel your heart beating differently than usual.  Your chest hurts.  You have trouble breathing.  You have very bad problems, like:  A fever.  A fit of movements that you cannot control.  Being very confused.  Feeling or seeing things that are not there.   This information is not intended to replace advice given to you by your health care provider. Make sure you discuss any questions you have with your health care provider.   Document Released: 12/04/2007 Document Revised: 07/08/2014 Document Reviewed: 04/05/2014 Elsevier Interactive Patient Education 2016 [employer]  Alcohol Use Disorder Alcohol use disorder is a mental disorder. It is not a one-time incident of heavy  drinking. Alcohol use disorder is the excessive and uncontrollable use of alcohol over time that leads to problems with functioning in one or more areas of daily living. People with this disorder risk harming themselves and others when they drink to excess. Alcohol use disorder also can cause other mental disorders, such as mood and anxiety disorders, and serious physical problems. People with alcohol use disorder often misuse other drugs.  Alcohol use disorder is common and widespread. Some people with this disorder drink alcohol to cope with or escape from negative life events. Others drink to relieve chronic pain or symptoms of mental illness. People with a family history of alcohol use disorder are at higher risk of losing control and using alcohol to excess.  Drinking too much alcohol can cause injury, accidents, and health problems. One drink can be too much when you are:  Working.  Pregnant or breastfeeding.  Taking medicines. Ask your doctor.  Driving or planning to drive. SYMPTOMS  Signs and symptoms of alcohol use disorder may include the following:   Consumption ofalcohol inlarger amounts or over a longer period of time than intended.  Multiple unsuccessful attempts to cutdown or control alcohol use.   A great deal of time spent obtaining alcohol, using alcohol, or recovering from the effects of alcohol (hangover).  A strong desire or urge to use alcohol (cravings).   Continued use of alcohol despite problems at work, school, or home because of alcohol use.   Continued use of alcohol despite problems in relationships because of alcohol use.  Continued use of alcohol in situations when it is physically hazardous, such as driving a car.  Continued use of alcohol despite awareness of a physical or psychological problem that is likely related to alcohol use. Physical problems related to alcohol use can involve the brain, heart, liver, stomach, and intestines. Psychological  problems related to alcohol use include intoxication, depression, anxiety, psychosis, delirium, and dementia.   The need for increased amounts of alcohol to achieve the same desired effect, or a decreased effect from the consumption of the same amount of alcohol (tolerance).  Withdrawal symptoms upon reducing or stopping alcohol use, or alcohol use to reduce or avoid withdrawal symptoms. Withdrawal symptoms include:  Racing heart.  Hand tremor.  Difficulty sleeping.  Nausea.  Vomiting.  Hallucinations.  Restlessness.  Seizures. DIAGNOSIS Alcohol use disorder is diagnosed through an assessment by your health care provider. Your health care provider may start by asking three or four questions to screen for excessive or problematic alcohol use. To confirm a diagnosis of alcohol use disorder, at least two symptoms must be present within a 1378-month period. The severity of alcohol use disorder depends on the number of symptoms:  Mild--two or three.  Moderate--four or five.  Severe--six or more. Your health care provider may perform a physical exam or use results from lab tests to see if you have physical problems resulting from alcohol use. Your health care provider may refer you to a mental health professional for evaluation. TREATMENT  Some people with alcohol use disorder are able to reduce their alcohol use to low-risk levels. Some people with alcohol use disorder need to quit drinking alcohol. When necessary, mental health professionals with specialized training in substance use treatment can help. Your health care provider can help you decide how severe your alcohol use disorder is and what type of treatment you need. The following forms of treatment are available:   Detoxification. Detoxification involves the use of prescription medicines to prevent alcohol withdrawal symptoms in the first week after quitting. This is important for people with a history of symptoms of withdrawal  and for heavy drinkers who are likely to have withdrawal symptoms. Alcohol withdrawal can be dangerous and, in severe cases, cause death. Detoxification is usually provided in a hospital or in-patient substance use treatment facility.  Counseling or talk therapy. Talk therapy is provided by substance use treatment counselors. It addresses the reasons people use alcohol and ways to keep them from drinking again. The goals of talk therapy are to help people with alcohol use disorder find healthy activities and ways to cope with life stress, to identify and avoid triggers for alcohol use, and to handle cravings, which can cause relapse.  Medicines.Different medicines can help treat alcohol use disorder through the following actions:  Decrease alcohol cravings.  Decrease the positive reward response felt from alcohol use.  Produce an uncomfortable physical reaction when alcohol is used (aversion therapy).  Support groups. Support groups are run by people who have quit drinking. They provide emotional support, advice, and guidance. These forms of treatment are often combined. Some people with alcohol use disorder benefit from intensive combination treatment provided by specialized substance use treatment centers. Both inpatient and outpatient treatment programs are available.   This information is not intended to replace advice given to you by your health care provider. Make sure you discuss any questions you have with your health care provider.   Document Released: 07/25/2004 Document Revised: 07/08/2014 Document Reviewed: 09/24/2012 Elsevier Interactive Patient Education 2016  Elsevier Inc.  Finding Treatment for Addiction WHAT IS ADDICTION? Addiction is a complex disease of the brain. It causes an uncontrollable (compulsive) need for a substance. You can be addicted to alcohol, illegal drugs, or prescription medicines such as painkillers. Addiction can also be a behavior, like gambling or  shopping. The need for the drug or activity can become so strong that you think about it all the time. You can also become physically dependent on a substance. Addiction can change the way your brain works. Because of these changes, getting more of whatever you are addicted to becomes the most important thing to you and feels better than other activities or relationships. Addiction can lead to changes in health, behavior, emotions, relationships, and choices that affect you and everyone around you. HOW DO I KNOW IF I NEED TREATMENT FOR ADDICTION? Addiction is a progressive disease. Without treatment, addiction can get worse. Living with addiction puts you at higher risk for injury, poor health, lost employment, loss of money, and even death. You might need treatment for addiction if:  You have tried to stop or cut down, but you cannot.  Your addiction is causing physical health problems.  You find it annoying that your friends and family are concerned about your alcohol or substance use.  You feel guilty about substance abuse or a compulsive behavior.  You have lied or tried to hide your addiction.  You need a particular substance or activity to start your day or to calm down.  You are getting in trouble at school, work, home, or with the police.  You have done something illegal to support your addiction.  You are running out of money because of your addiction.  You have no time for anything other than your addiction. WHAT TYPES OF TREATMENT ARE AVAILABLE? The treatment program that is right for you will depend on many factors, including the type of addiction you have. Treatment programs can be outpatient or inpatient. In an outpatient program, you live at home and go to work or school, but you also go to a clinic for treatment. With an inpatient program, you live and sleep at the program facility during treatment. After treatment, you might need a plan for support during recovery. Other  treatment options include:   Medicine.  Some addictions may be treated with prescription medicines.  You might also need medicine to treat anxiety or depression.  Counseling and behavior therapy. Therapy can help individuals and families behave in healthier ways and relate more effectively.  Support groups. Confidential group therapy, such as a 12-step program, can help individuals and families during treatment and recovery. No single type of program is right for everyone. Many treatment programs involve a combination of education, counseling, and a 12-step, spiritually-based approach. Some treatment programs are government sponsored. They are geared for patients who do not have private insurance. Treatment programs can vary in many respects, such as:  Cost and types of insurance that are accepted.  Types of on-site medical services that are offered.  Length of stay, setting, and size.  Overall philosophy of treatment. WHAT SHOULD I CONSIDER WHEN SELECTING A TREATMENT PROGRAM? It is important to think about your individual requirements when selecting a treatment program. There are a number of things to consider, such as:  If the program is certified by the appropriate government agency. Even private programs must be certified and employ certified professionals.  If the program is covered by your insurance. If finances are a concern, the first  call you should make is to your insurance company, if you have health insurance. Ask for a list of treatment programs that are in your network, and confirm any copayments and deductibles that you may have to pay.  If you do not have insurance, or if you choose to attend a program that does not accept your insurance, discuss whether a payment plan can be set up.  If treatment is available in languages other than English, if needed.  If the program offers detoxification treatment, if needed.  If 12-step meetings are held at the center or if  transport is available for patients to attend meetings at other locations.  If the program is professional, organized, and clean.  If the program meets all of your needs, including physical and cultural needs.  If the facility offers specific treatment for your particular addiction.  If support continues to be offered after you have left the program.  If your treatment plan is continually looked at to make sure you are receiving the right treatment at the right time.  If mental health counseling is part of your treatment.  If medicine is included in treatment, if needed.  If your family is included in your treatment plan and if support is offered to them throughout the treatment process.  How the treatment works to prevent relapse. WHERE ELSE CAN I GET HELP?  Your health care provider. Ask him or her to help you find addiction treatment. These discussions are confidential.  The [employer] on Alcoholism and Drug Dependence (NCADD). This group has information about treatment centers and programs for people who have an addiction and for family members.  The telephone number is 1-800-NCA-CALL (702-487-4683).  The website is https://ncadd.org/about-ncadd/our-affiliates  The Substance Abuse and Mental Health Services Administration [facility]). This group will help you find publicly funded treatment centers, help hotlines, and counseling services near you.  The telephone number is 1-800-662-HELP (906-509-6077).  The website is www.findtreatment.[employer].pl In countries outside of the [place]. and [place], look in [employer] for contact information for services in your area.   This information is not intended to replace advice given to you by your health care provider. Make sure you discuss any questions you have with your health care provider.   Document Released: 05/16/2005 Document Revised: 03/08/2015 Document Reviewed: 04/05/2014 Elsevier Interactive Patient Education  [employer]

## 2015-05-24 NOTE — Progress Notes (Addendum)
Pt discharged home per MD order. Discharge instructions, diet, follow up care, resources and medicine information given to patient.  IV removed.  All questions answered. Provided education about withdrawal. Patient verbalized understanding. Family to transport patient home.  Patient walked out with belongings and paperwork with mother.

## 2015-05-28 ENCOUNTER — Other Ambulatory Visit: Payer: Self-pay | Admitting: Psychiatry

## 2015-07-19 ENCOUNTER — Encounter: Payer: Self-pay | Admitting: *Deleted

## 2015-07-19 DIAGNOSIS — R110 Nausea: Secondary | ICD-10-CM | POA: Insufficient documentation

## 2015-07-19 DIAGNOSIS — I10 Essential (primary) hypertension: Secondary | ICD-10-CM | POA: Insufficient documentation

## 2015-07-19 DIAGNOSIS — R1010 Upper abdominal pain, unspecified: Secondary | ICD-10-CM | POA: Insufficient documentation

## 2015-07-19 DIAGNOSIS — F17210 Nicotine dependence, cigarettes, uncomplicated: Secondary | ICD-10-CM | POA: Insufficient documentation

## 2015-07-19 LAB — COMPREHENSIVE METABOLIC PANEL
ALT: 54 U/L (ref 17–63)
ANION GAP: 11 (ref 5–15)
AST: 143 U/L — AB (ref 15–41)
Albumin: 3.9 g/dL (ref 3.5–5.0)
Alkaline Phosphatase: 105 U/L (ref 38–126)
BUN: 7 mg/dL (ref 6–20)
CHLORIDE: 98 mmol/L — AB (ref 101–111)
CO2: 29 mmol/L (ref 22–32)
Calcium: 8.9 mg/dL (ref 8.9–10.3)
Creatinine, Ser: 0.68 mg/dL (ref 0.61–1.24)
GFR calc Af Amer: >60 mL/min (ref >60)
Glucose, Bld: 105 mg/dL — ABNORMAL HIGH (ref 65–99)
POTASSIUM: 3.4 mmol/L — AB (ref 3.5–5.1)
Sodium: 138 mmol/L (ref 135–145)
Total Bilirubin: 0.9 mg/dL (ref 0.3–1.2)
Total Protein: 7.6 g/dL (ref 6.5–8.1)

## 2015-07-19 LAB — CBC WITH DIFFERENTIAL/PLATELET
BASOS ABS: 0 10*3/uL (ref 0–0.1)
BASOS PCT: 1 %
EOS PCT: 1 %
Eosinophils Absolute: 0 10*3/uL (ref 0–0.7)
HCT: 40.1 % (ref 40.0–52.0)
Hemoglobin: 13.7 g/dL (ref 13.0–18.0)
LYMPHS ABS: 3 10*3/uL (ref 1.0–3.6)
LYMPHS PCT: 53 %
MCH: 31 pg (ref 26.0–34.0)
MCHC: 34.1 g/dL (ref 32.0–36.0)
MCV: 90.8 fL (ref 80.0–100.0)
MONO ABS: 0.6 10*3/uL (ref 0.2–1.0)
Monocytes Relative: 11 %
Neutro Abs: 1.9 10*3/uL (ref 1.4–6.5)
Neutrophils Relative %: 34 %
PLATELETS: 202 10*3/uL (ref 150–440)
RBC: 4.42 MIL/uL (ref 4.40–5.90)
RDW: 16.5 % — AB (ref 11.5–14.5)
WBC: 5.6 10*3/uL (ref 3.8–10.6)

## 2015-07-19 LAB — LIPASE, BLOOD: LIPASE: 20 U/L (ref 11–51)

## 2015-07-19 NOTE — ED Notes (Signed)
Unable to void at this time.

## 2015-07-19 NOTE — ED Notes (Signed)
Pt states i think i'm having a  pancreatic attack.  Pt has nausea.  Pt states i have a drinking problem.  Pt states upper abd hurts.  No back pain.  Pt took 800 mg motrin 2 hours ago for pain.

## 2015-07-20 ENCOUNTER — Emergency Department
Admission: EM | Admit: 2015-07-20 | Discharge: 2015-07-20 | Discharge status: Against Medical Advice | Payer: Self-pay | Attending: Emergency Medicine | Admitting: Emergency Medicine

## 2015-07-21 ENCOUNTER — Encounter: Payer: Self-pay | Admitting: Emergency Medicine

## 2015-07-21 ENCOUNTER — Telehealth: Payer: Self-pay | Admitting: Emergency Medicine

## 2015-07-21 ENCOUNTER — Emergency Department: Payer: Self-pay

## 2015-07-21 ENCOUNTER — Emergency Department
Admission: EM | Admit: 2015-07-21 | Discharge: 2015-07-21 | Disposition: A | Discharge status: Home / Self Care | Payer: Self-pay | Attending: Emergency Medicine | Admitting: Emergency Medicine

## 2015-07-21 DIAGNOSIS — F17210 Nicotine dependence, cigarettes, uncomplicated: Secondary | ICD-10-CM | POA: Insufficient documentation

## 2015-07-21 DIAGNOSIS — K2970 Gastritis, unspecified, without bleeding: Secondary | ICD-10-CM | POA: Insufficient documentation

## 2015-07-21 DIAGNOSIS — Z79899 Other long term (current) drug therapy: Secondary | ICD-10-CM | POA: Insufficient documentation

## 2015-07-21 DIAGNOSIS — R1013 Epigastric pain: Secondary | ICD-10-CM

## 2015-07-21 DIAGNOSIS — I10 Essential (primary) hypertension: Secondary | ICD-10-CM | POA: Insufficient documentation

## 2015-07-21 DIAGNOSIS — R945 Abnormal results of liver function studies: Secondary | ICD-10-CM | POA: Insufficient documentation

## 2015-07-21 DIAGNOSIS — R7989 Other specified abnormal findings of blood chemistry: Secondary | ICD-10-CM

## 2015-07-21 LAB — COMPREHENSIVE METABOLIC PANEL
ALT: 89 U/L — ABNORMAL HIGH (ref 17–63)
AST: 376 U/L — AB (ref 15–41)
Albumin: 4 g/dL (ref 3.5–5.0)
Alkaline Phosphatase: 133 U/L — ABNORMAL HIGH (ref 38–126)
Anion gap: 10 (ref 5–15)
CHLORIDE: 99 mmol/L — AB (ref 101–111)
CO2: 28 mmol/L (ref 22–32)
Calcium: 9.1 mg/dL (ref 8.9–10.3)
Creatinine, Ser: 0.78 mg/dL (ref 0.61–1.24)
GFR calc Af Amer: >60 mL/min (ref >60)
Glucose, Bld: 126 mg/dL — ABNORMAL HIGH (ref 65–99)
POTASSIUM: 2.7 mmol/L — AB (ref 3.5–5.1)
Sodium: 137 mmol/L (ref 135–145)
Total Bilirubin: 0.5 mg/dL (ref 0.3–1.2)
Total Protein: 8 g/dL (ref 6.5–8.1)

## 2015-07-21 LAB — CBC
HEMATOCRIT: 40.2 % (ref 40.0–52.0)
HEMOGLOBIN: 13.7 g/dL (ref 13.0–18.0)
MCH: 31.6 pg (ref 26.0–34.0)
MCHC: 34.1 g/dL (ref 32.0–36.0)
MCV: 92.7 fL (ref 80.0–100.0)
Platelets: 186 10*3/uL (ref 150–440)
RBC: 4.33 MIL/uL — AB (ref 4.40–5.90)
RDW: 16.3 % — AB (ref 11.5–14.5)
WBC: 8.2 10*3/uL (ref 3.8–10.6)

## 2015-07-21 LAB — LIPASE, BLOOD: LIPASE: 45 U/L (ref 11–51)

## 2015-07-21 MED ORDER — CARBAMAZEPINE 200 MG PO TABS: 200.0000 mg | ORAL_TABLET | Freq: Two times a day (BID) | ORAL | Status: DC

## 2015-07-21 MED ORDER — HYDROXYZINE PAMOATE 50 MG PO CAPS: 50.0000 mg | ORAL_CAPSULE | Freq: Three times a day (TID) | ORAL | Status: AC | PRN

## 2015-07-21 MED ORDER — POTASSIUM CHLORIDE CRYS ER 20 MEQ PO TBCR
40.0000 meq | EXTENDED_RELEASE_TABLET | Freq: Once | ORAL | Status: AC
  Administered 2015-07-21: 40 meq via ORAL
  Filled 2015-07-21: qty 2

## 2015-07-21 MED ORDER — GI COCKTAIL ~~LOC~~
30.0000 mL | Freq: Once | ORAL | Status: AC
  Administered 2015-07-21: 30 mL via ORAL
  Filled 2015-07-21: qty 30

## 2015-07-21 NOTE — ED Notes (Signed)
MD Lord at bedside. 

## 2015-07-21 NOTE — Discharge Instructions (Signed)
You were evaluated for upper abdominal pain and although no certain cause was found, your exam and evaluation are reassuring.  I suspect the source of pain is gastritis.  Take over the counter Maalox or Tums to neutralize acid.  Try over the counter Prilosec  daily for 2 weeks.  Avoid fatty, spicy, acidic, and caffeine foods/beverages until better.  May try liquid diet for 24 hours then advance as tolerated, in case there is some amount of chronic pancreatitis causing pain.  Return to ER for fever, worsening pain, black or bloody stools, concern for dehydration, or any other symptoms concerning to you.  You were given potassium replacement in the ER.   Gastritis, Adult Gastritis is soreness and puffiness (inflammation) of the lining of the stomach. If you do not get help, gastritis can cause bleeding and sores (ulcers) in the stomach. HOME CARE   Only take medicine as told by your doctor.  If you were given antibiotic medicines, take them as told. Finish the medicines even if you start to feel better.  Drink enough fluids to keep your pee (urine) clear or pale yellow.  Avoid foods and drinks that make your problems worse. Foods you may want to avoid include:  Caffeine or alcohol.  Chocolate.  Mint.  Garlic and onions.  Spicy foods.  Citrus fruits, including oranges, lemons, or limes.  Food containing tomatoes, including sauce, chili, salsa, and pizza.  Fried and fatty foods.  Eat small meals throughout the day instead of large meals. GET HELP RIGHT AWAY IF:   You have black or dark red poop (stools).  You throw up (vomit) blood. It may look like coffee grounds.  You cannot keep fluids down.  Your belly (abdominal) pain gets worse.  You have a fever.  You do not feel better after 1 week.  You have any other questions or concerns. MAKE SURE YOU:   Understand these instructions.  Will watch your condition.  Will get help right away if you are not doing well  or get worse.   This information is not intended to replace advice given to you by your health care provider. Make sure you discuss any questions you have with your health care provider.   Document Released: 12/04/2007 Document Revised: 09/09/2011 Document Reviewed: 07/31/2011 Elsevier Interactive Patient Education 2016 [employer]. Hypokalemia Hypokalemia means that the amount of potassium in the blood is lower than normal.Potassium is a chemical, called an electrolyte, that helps regulate the amount of fluid in the body. It also stimulates muscle contraction and helps nerves function properly.Most of the body's potassium is inside of cells, and only a very small amount is in the blood. Because the amount in the blood is so small, minor changes can be life-threatening. CAUSES  Antibiotics.  Diarrhea or vomiting.  Using laxatives too much, which can cause diarrhea.  Chronic kidney disease.  Water pills (diuretics).  Eating disorders (bulimia).  Low magnesium level.  Sweating a lot. SIGNS AND SYMPTOMS  Weakness.  Constipation.  Fatigue.  Muscle cramps.  Mental confusion.  Skipped heartbeats or irregular heartbeat (palpitations).  Tingling or numbness. DIAGNOSIS  Your health care provider can diagnose hypokalemia with blood tests. In addition to checking your potassium level, your health care provider may also check other lab tests. TREATMENT Hypokalemia can be treated with potassium supplements taken by mouth or adjustments in your current medicines. If your potassium level is very low, you may need to get potassium through a vein (IV) and be monitored  in the hospital. A diet high in potassium is also helpful. Foods high in potassium are:  Nuts, such as peanuts and pistachios.  Seeds, such as sunflower seeds and pumpkin seeds.  Peas, lentils, and lima beans.  Whole grain and bran cereals and breads.  Fresh fruit and vegetables, such as apricots, avocado,  bananas, cantaloupe, kiwi, oranges, tomatoes, asparagus, and potatoes.  Orange and tomato juices.  Red meats.  Fruit yogurt. HOME CARE INSTRUCTIONS  Take all medicines as prescribed by your health care provider.  Maintain a healthy diet by including nutritious food, such as fruits, vegetables, nuts, whole grains, and lean meats.  If you are taking a laxative, be sure to follow the directions on the label. SEEK MEDICAL CARE IF:  Your weakness gets worse.  You feel your heart pounding or racing.  You are vomiting or having diarrhea.  You are diabetic and having trouble keeping your blood glucose in the normal range. SEEK IMMEDIATE MEDICAL CARE IF:  You have chest pain, shortness of breath, or dizziness.  You are vomiting or having diarrhea for more than 2 days.  You faint. MAKE SURE YOU:   Understand these instructions.  Will watch your condition.  Will get help right away if you are not doing well or get worse.   This information is not intended to replace advice given to you by your health care provider. Make sure you discuss any questions you have with your health care provider.   Document Released: 06/17/2005 Document Revised: 07/08/2014 Document Reviewed: 12/18/2012 Elsevier Interactive Patient Education [employer]

## 2015-07-21 NOTE — ED Notes (Signed)
Pt at US at this time

## 2015-07-21 NOTE — ED Notes (Signed)
Called patient due to lwot to inquire about condition and follow up plans.. Patient was not home.  Mom says she is fine.

## 2015-07-21 NOTE — ED Notes (Signed)
Pt made aware for need to provide urine sample. Given cup, will let RN know once able to give sample.

## 2015-07-21 NOTE — ED Notes (Addendum)
Dr Lorre Munroe of [facility]

## 2015-07-21 NOTE — ED Provider Notes (Addendum)
[facility] Provider Note   ____________________________________________  Time seen: Approximately 3:35p I have reviewed the triage vital signs and the triage nursing note.  HISTORY  Chief Complaint Abdominal Pain   Historian Patient  HPI Johnathan Rivera is a 30 y.o. male with history of alcohol abuse, alcohol withdrawal seizures, and prior pancreatitis, complaining of abdominal pain in the epigastrium for about 3-4 days.  Stopped drinking alcohol 2 days ago.  Some nausea, no vomiting.  Mild diarrhea, non bloody.  No fever.  No lower abd pain or urinary symptoms.  Feels like prior pancreatitis to him.  He is doing outpatient alcohol withdrawal treatment currently.  Symptoms mild - moderate.    Past Medical History  Diagnosis Date  . Seizures ([place])   . Polysubstance abuse   . Pancreatitis   . Asthma   . Alcohol dependence ([place])   . Hypertension     Patient Active Problem List   Diagnosis Date Noted  . 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 Seizure ([place]) 05/22/2015  . Alcohol withdrawal ([place]) 05/22/2015  . Peripheral neuropathy ([place]) 05/10/2015  . Seizure disorder ([place]) 05/10/2015  . Acute alcoholic pancreatitis 82/99/3716  . Hypokalemia 03/21/2015  . Hypomagnesemia 03/21/2015  . Alcohol abuse 01/04/2015  . Alcohol withdrawal seizure ([place]) 01/04/2015  . Anxiety 01/04/2015  . Alcoholic hepatitis 96/78/9381  . Alcohol dependence ([place]) 01/04/2015  . Elevated LFTs   . Polysubstance abuse     Past Surgical History  Procedure Laterality Date  . No past surgeries      Current Outpatient Rx  Name  Route  Sig  Dispense  Refill  . acetaminophen (TYLENOL) 325 MG tablet   Oral   Take 2 tablets (650 mg total) by mouth every 6 (six) hours as needed for mild pain (or Fever >/= 101). Patient not taking: Reported on 03/21/2015   1 tablet   0   . atenolol (TENORMIN) 50 MG tablet   Oral   Take 1 tablet (50  mg total) by mouth daily. Patient not taking: Reported on 03/21/2015   30 tablet   0   . busPIRone (BUSPAR) 10 MG tablet   Oral   Take 1 tablet (10 mg total) by mouth 3 (three) times daily.   90 tablet   0   . carbamazepine (TEGRETOL) 200 MG tablet   Oral   Take 1 tablet (200 mg total) by mouth 2 (two) times daily.   60 tablet   0   . clonazePAM (KLONOPIN) 2 MG tablet   Oral   Take 1 tablet (2 mg total) by mouth 2 (two) times daily. Patient not taking: Reported on 03/21/2015   30 tablet   0   . folic acid (FOLVITE) 1 MG tablet   Oral   Take 1 tablet (1 mg total) by mouth daily.   30 tablet   0   . hydrALAZINE (APRESOLINE) 25 MG tablet   Oral   Take 1 tablet (25 mg total) by mouth 3 (three) times daily.   90 tablet   0   . hydrOXYzine (VISTARIL) 50 MG capsule   Oral   Take 1 capsule (50 mg total) by mouth 3 (three) times daily as needed for anxiety.   60 capsule   0   . Multiple Vitamin (MULTIVITAMIN WITH MINERALS) TABS tablet   Oral   Take 1 tablet by mouth daily.   30 tablet   0   . ondansetron (ZOFRAN) 4 MG  tablet      Take 1-2 tabs by mouth every 8 hours as needed for nausea/vomiting Patient not taking: Reported on 03/21/2015   30 tablet   0   . oxyCODONE-acetaminophen (PERCOCET/ROXICET) 5-325 MG per tablet   Oral   Take 1 tablet by mouth every 4 (four) hours as needed for moderate pain. Patient not taking: Reported on 05/22/2015   20 tablet   0   . pantoprazole (PROTONIX) 40 MG tablet   Oral   Take 1 tablet (40 mg total) by mouth 2 (two) times daily. Patient not taking: Reported on 05/22/2015   60 tablet   0   . potassium chloride SA (KLOR-CON M20) 20 MEQ tablet   Oral   Take 1 tablet (20 mEq total) by mouth 2 (two) times daily. Patient not taking: Reported on 03/21/2015   14 tablet   0     Allergies Review of patient's allergies indicates no known allergies.  Family History  Problem Relation Age of Onset  . Diabetes Mother   . Liver  disease Neg Hx   . Colon cancer Neg Hx   . Lung cancer Father     Social History Social History  Substance Use Topics  . Smoking status: Current Every Day Smoker -- 1.00 packs/day for 10 years    Types: Cigarettes  . Smokeless tobacco: Never Used  . Alcohol Use: No     Comment: daily    Review of Systems  Constitutional: Negative for fever. Eyes: Negative for visual changes. ENT: Negative for sore throat. Cardiovascular: Negative for chest pain. Respiratory: Negative for shortness of breath. Gastrointestinal: Per hpi Genitourinary: Negative for dysuria. Musculoskeletal: Negative for back pain. Skin: Negative for rash. Neurological: Negative for headache. 10 point Review of Systems otherwise negative ____________________________________________   PHYSICAL EXAM:  VITAL SIGNS: ED Triage Vitals  Enc Vitals Group     BP 07/21/15 1428 149/108 mmHg     Pulse Rate 07/21/15 1428 117     Resp 07/21/15 1428 20     Temp 07/21/15 1428 98.2 F (36.8 C)     Temp Source 07/21/15 1428 Oral     SpO2 07/21/15 1428 100 %     Weight 07/21/15 1428 170 lb (77.111 kg)     Height 07/21/15 1428 _0  (1.753 m)     Head Cir --      Peak Flow --      Pain Score 07/21/15 1429 7     Pain Loc --      Pain Edu? --      Excl. in [place]? --      Constitutional: Alert and oriented. Well appearing and in no distress. Eyes: Conjunctivae are normal. PERRL. Normal extraocular movements. ENT   Head: Normocephalic and atraumatic.   Nose: No congestion/rhinnorhea.   Mouth/Throat: Mucous membranes are moist.   Neck: No stridor. Cardiovascular/Chest: Normal rate, regular rhythm.  No murmurs, rubs, or gallops. Respiratory: Normal respiratory effort without tachypnea nor retractions. Breath sounds are clear and equal bilaterally. No wheezes/rales/rhonchi. Gastrointestinal: Soft. No distention, no guarding, no rebound. Mild tenderness in the epigastrium, no lower abd  tenderness. Genitourinary/rectal:Deferred Musculoskeletal: Nontender with normal range of motion in all extremities. No joint effusions.  No lower extremity tenderness.  No edema. Neurologic:  Normal speech and language. No gross or focal neurologic deficits are appreciated. Skin:  Skin is warm, dry and intact. No rash noted. Psychiatric: Mood and affect are normal. Speech and behavior are normal. Patient exhibits appropriate  insight and judgment.  ____________________________________________   EKG I, Lisa Roca, MD, the attending physician have personally viewed and interpreted all ECGs.  97bpm.  Narrow qrs.  Nl axis.  Nl  St and t wave ____________________________________________  LABS (pertinent positives/negatives)  Lipase 45 Complete met panel signif for k 2.57mast 376, alt 89, alk phos 133 and bili 0.5 Wbc 8.2, hgb 13.7, plt 186 ____________________________________________  RADIOLOGY All Xrays were viewed by me. Imaging interpreted by Radiologist.  [place]aruq:  No evidence of cholelithiasis or biliary ductal dilatation.  Mild hepatic steatosis. __________________________________________  PROCEDURES  Procedure(s) performed: None  Critical Care performed: None  ____________________________________________   ED COURSE / ASSESSMENT AND PLAN  Pertinent labs & imaging results that were available during my care of the patient were reviewed by me and considered in my medical decision making (see chart for details).   Epigastric pain suspected clinically to be gastritis/alcoholic vs. Biliary colic vs. Pancreatitis.  With lipase normal, suspect more likely stomach in origin, although chronic pancreatitis a possibility.  Due to lfts elevated more than prior, will uKorearuq.  Suspect alcoholic hepatitis.  Given kcl po for hypokalemia.  Able to take po.  Reassuring exam and evaluation.  OBatesfor outpatient management.  CONSULTATIONS:   none   Patient / Family / Caregiver  informed of clinical course, medical decision-making process, and agree with plan.   I discussed return precautions, follow-up instructions, and discharged instructions with patient and/or family.   DISCHARGE INSTRUCTIONS:  You were evaluated for upper abdominal pain and although no certain cause was found, your exam and evaluation are reassuring.  I suspect the source of pain is gastritis.  Take over the counter Maalox or Tums to neutralize acid.  Try over the counter Prilosec 245mdaily for 2 weeks.  Avoid fatty, spicy, acidic, and caffeine foods/beverages until better.  May try liquid diet for 24 hours then advance as tolerated, in case there is some amount of chronic pancreatitis causing pain.  Return to ER for fever, worsening pain, black or bloody stools, concern for dehydration, or any other symptoms concerning to you.  You were given potassium replacement in the ER.   ___________________________________________   FINAL CLINICAL IMPRESSION(S) / ED DIAGNOSES   Final diagnoses:  Abnormal LFTs  Epigastric pain  Gastritis    Pt asked for refill on meds  He's been out of - appt with new? pcp in 1.5 weeks.  requested hydroxyzine, buspar, other anxiety med, and carbemazepine for seizure.  I prescribed hydroxyzine and tegretol.  Other psychoactive meds to come from psychiatrist or pcp.          Note: This dictation was prepared with Dragon dictation. Any transcriptional errors that result from this process are unintentional   ReLisa RocaMD 07/21/15 [id number] 07/21/15 [id number]

## 2015-07-21 NOTE — ED Notes (Signed)
Pt to ed with c/o abd pain that started about 1 day ago.  Pt reports he is currently in intense outpt treatment for alcohol abuse and has hx of pancreatitis.

## 2015-08-17 ENCOUNTER — Encounter: Payer: Self-pay | Admitting: Emergency Medicine

## 2015-08-17 ENCOUNTER — Observation Stay
Admission: EM | Admit: 2015-08-17 | Discharge: 2015-08-18 | Disposition: A | Discharge status: Home / Self Care | Payer: Self-pay | Attending: Internal Medicine | Admitting: Internal Medicine

## 2015-08-17 DIAGNOSIS — K922 Gastrointestinal hemorrhage, unspecified: Secondary | ICD-10-CM

## 2015-08-17 DIAGNOSIS — F419 Anxiety disorder, unspecified: Secondary | ICD-10-CM | POA: Insufficient documentation

## 2015-08-17 DIAGNOSIS — G40909 Epilepsy, unspecified, not intractable, without status epilepticus: Secondary | ICD-10-CM | POA: Insufficient documentation

## 2015-08-17 DIAGNOSIS — K210 Gastro-esophageal reflux disease with esophagitis: Principal | ICD-10-CM | POA: Insufficient documentation

## 2015-08-17 DIAGNOSIS — K7010 Alcoholic hepatitis without ascites: Secondary | ICD-10-CM | POA: Insufficient documentation

## 2015-08-17 DIAGNOSIS — E876 Hypokalemia: Secondary | ICD-10-CM | POA: Insufficient documentation

## 2015-08-17 DIAGNOSIS — G629 Polyneuropathy, unspecified: Secondary | ICD-10-CM | POA: Insufficient documentation

## 2015-08-17 DIAGNOSIS — Z79899 Other long term (current) drug therapy: Secondary | ICD-10-CM | POA: Insufficient documentation

## 2015-08-17 DIAGNOSIS — K920 Hematemesis: Secondary | ICD-10-CM

## 2015-08-17 DIAGNOSIS — R1013 Epigastric pain: Secondary | ICD-10-CM | POA: Insufficient documentation

## 2015-08-17 DIAGNOSIS — I10 Essential (primary) hypertension: Secondary | ICD-10-CM | POA: Insufficient documentation

## 2015-08-17 DIAGNOSIS — K2921 Alcoholic gastritis with bleeding: Secondary | ICD-10-CM | POA: Insufficient documentation

## 2015-08-17 DIAGNOSIS — F17210 Nicotine dependence, cigarettes, uncomplicated: Secondary | ICD-10-CM | POA: Insufficient documentation

## 2015-08-17 DIAGNOSIS — R7989 Other specified abnormal findings of blood chemistry: Secondary | ICD-10-CM | POA: Insufficient documentation

## 2015-08-17 DIAGNOSIS — J45909 Unspecified asthma, uncomplicated: Secondary | ICD-10-CM | POA: Insufficient documentation

## 2015-08-17 DIAGNOSIS — R112 Nausea with vomiting, unspecified: Secondary | ICD-10-CM

## 2015-08-17 LAB — URINALYSIS COMPLETE WITH MICROSCOPIC (ARMC ONLY)
BACTERIA UA: NONE SEEN
Bilirubin Urine: NEGATIVE
Glucose, UA: NEGATIVE mg/dL
HGB URINE DIPSTICK: NEGATIVE
Leukocytes, UA: NEGATIVE
NITRITE: NEGATIVE
PH: 6 (ref 5.0–8.0)
PROTEIN: NEGATIVE mg/dL
RBC / HPF: NONE SEEN RBC/hpf (ref 0–5)
Specific Gravity, Urine: 1.02 (ref 1.005–1.030)
Squamous Epithelial / LPF: NONE SEEN

## 2015-08-17 LAB — COMPREHENSIVE METABOLIC PANEL
ALBUMIN: 4.4 g/dL (ref 3.5–5.0)
ALK PHOS: 81 U/L (ref 38–126)
ALT: 33 U/L (ref 17–63)
ANION GAP: 14 (ref 5–15)
AST: 31 U/L (ref 15–41)
BUN: 12 mg/dL (ref 6–20)
CALCIUM: 9 mg/dL (ref 8.9–10.3)
CO2: 28 mmol/L (ref 22–32)
Chloride: 100 mmol/L — ABNORMAL LOW (ref 101–111)
Creatinine, Ser: 0.92 mg/dL (ref 0.61–1.24)
GFR calc non Af Amer: >60 mL/min (ref >60)
GLUCOSE: 115 mg/dL — AB (ref 65–99)
POTASSIUM: 3.5 mmol/L (ref 3.5–5.1)
SODIUM: 142 mmol/L (ref 135–145)
Total Bilirubin: 0.7 mg/dL (ref 0.3–1.2)
Total Protein: 8.2 g/dL — ABNORMAL HIGH (ref 6.5–8.1)

## 2015-08-17 LAB — CBC
HEMATOCRIT: 45.2 % (ref 40.0–52.0)
HEMOGLOBIN: 15.1 g/dL (ref 13.0–18.0)
MCH: 30.9 pg (ref 26.0–34.0)
MCHC: 33.4 g/dL (ref 32.0–36.0)
MCV: 92.7 fL (ref 80.0–100.0)
Platelets: 326 10*3/uL (ref 150–440)
RBC: 4.88 MIL/uL (ref 4.40–5.90)
RDW: 15.3 % — ABNORMAL HIGH (ref 11.5–14.5)
WBC: 9.3 10*3/uL (ref 3.8–10.6)

## 2015-08-17 LAB — LIPASE, BLOOD: LIPASE: 18 U/L (ref 11–51)

## 2015-08-17 MED ORDER — SODIUM CHLORIDE 0.9 % IV SOLN
50.0000 ug/h | INTRAVENOUS | Status: DC
  Administered 2015-08-17: 50 ug/h via INTRAVENOUS
  Filled 2015-08-17 (×3): qty 1

## 2015-08-17 MED ORDER — PANTOPRAZOLE SODIUM 40 MG IV SOLR: 80.0000 mg | Freq: Once | INTRAVENOUS | Status: DC

## 2015-08-17 MED ORDER — LORAZEPAM 2 MG/ML IJ SOLN: 1.0000 mg | INTRAMUSCULAR | Status: DC | PRN

## 2015-08-17 MED ORDER — POTASSIUM CHLORIDE IN NACL 20-0.45 MEQ/L-% IV SOLN
INTRAVENOUS | Status: DC
  Administered 2015-08-18: 02:00:00 via INTRAVENOUS
  Filled 2015-08-17 (×3): qty 1000

## 2015-08-17 MED ORDER — PANTOPRAZOLE SODIUM 40 MG IV SOLR: 40.0000 mg | Freq: Two times a day (BID) | INTRAVENOUS | Status: DC

## 2015-08-17 MED ORDER — SODIUM CHLORIDE 0.9 % IV BOLUS (SEPSIS)
1000.0000 mL | Freq: Once | INTRAVENOUS | Status: AC
  Administered 2015-08-17: 1000 mL via INTRAVENOUS

## 2015-08-17 MED ORDER — PANTOPRAZOLE SODIUM 40 MG IV SOLR
80.0000 mg | Freq: Once | INTRAVENOUS | Status: AC
  Administered 2015-08-17: 80 mg via INTRAVENOUS
  Filled 2015-08-17: qty 80

## 2015-08-17 MED ORDER — ONDANSETRON HCL 4 MG/2ML IJ SOLN
4.0000 mg | Freq: Once | INTRAMUSCULAR | Status: AC
  Administered 2015-08-17: 4 mg via INTRAVENOUS

## 2015-08-17 MED ORDER — OCTREOTIDE LOAD VIA INFUSION
50.0000 ug | Freq: Once | INTRAVENOUS | Status: AC
  Administered 2015-08-17: 50 ug via INTRAVENOUS
  Filled 2015-08-17: qty 25

## 2015-08-17 MED ORDER — SODIUM CHLORIDE 0.9 % IV SOLN
8.0000 mg/h | INTRAVENOUS | Status: DC
  Filled 2015-08-17: qty 80

## 2015-08-17 MED ORDER — SODIUM CHLORIDE 0.9 % IV SOLN
8.0000 mg/h | INTRAVENOUS | Status: DC
  Administered 2015-08-18 (×2): 8 mg/h via INTRAVENOUS
  Filled 2015-08-17 (×2): qty 80

## 2015-08-17 NOTE — H&P (Signed)
History and Physical    Johnathan Rivera ZOX:[id number] DOB: [birth year] DOA: 08/17/2015  Referring physician: Dr. Pershing Proud PCP: No PCP Per Patient  Specialists: none  Chief Complaint: vomiting blood  HPI: Johnathan Rivera is a 30 y.o. male has a past medical history significant for ETOH abuse with pancreatitis, anxiety and ETOH withdrawal seizures now with acute onset of epigastric pain with bloody vomitus. No hx of same. Denies pain now. Hgb stable but pt is tachycardic. He is now admitted. No recent seizures. Last ETOH use was yesterday afternoon  Review of Systems: The patient denies anorexia, fever, weight loss,, vision loss, decreased hearing, hoarseness, chest pain, syncope, dyspnea on exertion, peripheral edema, balance deficits, hemoptysis,  melena, hematochezia, severe indigestion/heartburn, hematuria, incontinence, genital sores, muscle weakness, suspicious skin lesions, transient blindness, difficulty walking, depression, unusual weight change, abnormal bleeding, enlarged lymph nodes, angioedema, and breast masses.   Past Medical History  Diagnosis Date  . Seizures (HCC)   . Polysubstance abuse   . Pancreatitis   . Asthma   . Alcohol dependence ([facility])   . Hypertension    Past Surgical History  Procedure Laterality Date  . No past surgeries     Social History:  reports that he has been smoking Cigarettes.  He has a 10 pack-year smoking history. He has never used smokeless tobacco. He reports that he drinks alcohol. He reports that he uses illicit drugs (Marijuana and Benzodiazepines).  No Known Allergies  Family History  Problem Relation Age of Onset  . Diabetes Mother   . Liver disease Neg Hx   . Colon cancer Neg Hx   . Lung cancer Father     Prior to Admission medications   Medication Sig Start Date End Date Taking? Authorizing Provider  busPIRone (BUSPAR) 10 MG tablet Take 1 tablet (10 mg total) by mouth 3 (three) times daily. 05/10/15  Yes Audery Amel, MD  carbamazepine (TEGRETOL) 200 MG tablet Take 1 tablet (200 mg total) by mouth 2 (two) times daily. 07/21/15  Yes Governor Rooks, MD  folic acid (FOLVITE) 1 MG tablet Take 1 tablet (1 mg total) by mouth daily. 03/23/15  Yes Altamese Dilling, MD  hydrALAZINE (APRESOLINE) 25 MG tablet Take 1 tablet (25 mg total) by mouth 3 (three) times daily. 03/23/15  Yes Altamese Dilling, MD  hydrOXYzine (VISTARIL) 50 MG capsule Take 1 capsule (50 mg total) by mouth 3 (three) times daily as needed for anxiety. 07/21/15  Yes Governor Rooks, MD  Multiple Vitamin (MULTIVITAMIN WITH MINERALS) TABS tablet Take 1 tablet by mouth daily. 03/23/15  Yes Altamese Dilling, MD  acetaminophen (TYLENOL) 325 MG tablet Take 2 tablets (650 mg total) by mouth every 6 (six) hours as needed for mild pain (or Fever >/= 101). Patient not taking: Reported on 03/21/2015 01/06/15   Auburn Bilberry, MD  atenolol (TENORMIN) 50 MG tablet Take 1 tablet (50 mg total) by mouth daily. Patient not taking: Reported on 03/21/2015 01/06/15   Auburn Bilberry, MD  clonazePAM (KLONOPIN) 2 MG tablet Take 1 tablet (2 mg total) by mouth 2 (two) times daily. Patient not taking: Reported on 03/21/2015 01/06/15   Auburn Bilberry, MD  ondansetron [facility]) 4 MG tablet Take 1-2 tabs by mouth every 8 hours as needed for nausea/vomiting Patient not taking: Reported on 03/21/2015 03/20/15   Loleta Rose, MD  oxyCODONE-acetaminophen (PERCOCET/ROXICET) 5-325 MG per tablet Take 1 tablet by mouth every 4 (four) hours as needed for moderate pain. Patient not taking: Reported on 05/22/2015  03/23/15   Altamese Dilling, MD  pantoprazole (PROTONIX) 40 MG tablet Take 1 tablet (40 mg total) by mouth 2 (two) times daily. Patient not taking: Reported on 05/22/2015 03/23/15   Altamese Dilling, MD  potassium chloride SA (KLOR-CON M20) 20 MEQ tablet Take 1 tablet (20 mEq total) by mouth 2 (two) times daily. Patient not taking: Reported on 03/21/2015 03/20/15   Loleta Rose, MD   Physical Exam: Filed Vitals:   08/17/15 2054  BP: 144/100  Pulse: 109  Temp: 98.6 F (37 C)  TempSrc: Oral  Resp: 18  Height:  (1.803 m)  Weight: 79.379 kg (175 lb)  SpO2: 98%     General:  No apparent distress, [place]/AT  Eyes: PERRL, EOMI, no scleral icterus, conjunctiva clear  ENT: moist oropharynx, dentition fair  Neck: supple, no lymphadenopathy. No JVD or thyromegaly  Cardiovascular: rapid rate with regular rhythm without MRG; 2+ peripheral pulses, no JVD, no peripheral edema  Respiratory: CTA biL, good air movement without wheezing, rhonchi or crackled  Abdomen: soft, mildly tender to palpation in the epigastrum, positive bowel sounds, no guarding, no rebound  Skin: no rashes or lesions  Musculoskeletal: normal bulk and tone, no joint swelling  Psychiatric: normal mood and affect, A&OX3  Neurologic: CN 2-12 grossly intact, Motor strength 5/5 in all 4 muscle groups, normal sensory exam. DTR's symmetric  Labs on Admission:  Basic Metabolic Panel:  Recent Labs Lab 08/17/15 2103  NA 142  K 3.5  CL 100*  CO2 28  GLUCOSE 115*  BUN 12  CREATININE 0.92  CALCIUM 9.0   Liver Function Tests:  Recent Labs Lab 08/17/15 2103  AST 31  ALT 33  ALKPHOS 81  BILITOT 0.7  PROT 8.2*  ALBUMIN 4.4    Recent Labs Lab 08/17/15 2103  LIPASE 18   No results for input(s): AMMONIA in the last 168 hours. CBC:  Recent Labs Lab 08/17/15 2103  WBC 9.3  HGB 15.1  HCT 45.2  MCV 92.7  PLT 326   Cardiac Enzymes: No results for input(s): CKTOTAL, CKMB, CKMBINDEX, TROPONINI in the last 168 hours.  BNP (last 3 results) No results for input(s): BNP in the last 8760 hours.  ProBNP (last 3 results) No results for input(s): PROBNP in the last 8760 hours.  CBG: No results for input(s): GLUCAP in the last 168 hours.  Radiological Exams on Admission: No results found.  EKG: Independently reviewed.  Assessment/Plan Principal Problem:   GI  bleed Active Problems:   Seizure disorder (HCC)   Hematemesis   Nausea & vomiting   Will observe on the floor with Protonix drip. Consult GI. Follow hgb closely. NPO except sips for now. CIWA protocol for ETOH withdrawal  Diet: NPO Fluids: 1/2 NS with K+ DVT Prophylaxis: TED hose  Code Status: FULL  Family Communication: yes  Disposition Plan: home  Time spent: 45 min

## 2015-08-17 NOTE — ED Notes (Signed)
Pt arrived to the ED for complaints of vomiting starting today with a little of blood showing in the vomit. Pt is AOx4 in no apparent distress.

## 2015-08-17 NOTE — ED Provider Notes (Signed)
[facility] Emergency Department Provider Note  ____________________________________________  Time seen: Approximately 10 PM  I have reviewed the triage vital signs and the nursing notes.   HISTORY  Chief Complaint Emesis    HPI Johnathan Rivera is a 31 y.o. male with a history of alcoholism and alcohol withdrawal seizures who is presenting tonight with vomiting blood.He denies any pain. Says that he has not been feeling ill prior to these vomiting episodes this afternoon. He said that he vomited 3 times total. This is all around 6 PM. He says that the initial episode was a yellowish vomit followed by a shot glass quantity of bright red blood and then followed by a third episode of maroon emesis. The third episode was also about a shot glass in quantity. He denies any blood in his stool. Says that he has a history of heavy drinking and drinks about a pint of wine a day now but has significantly slowed his alcohol intake. He says that he used to be a multiple drink a day drinker, including hard liquor. He doesn't a history of pancreatitis. No known history of cirrhosis. He has never had an endoscopy.   Past Medical History  Diagnosis Date  . Seizures (HCC)   . Polysubstance abuse   . Pancreatitis   . Asthma   . Alcohol dependence (HCC)   . Hypertension     Patient Active Problem List   Diagnosis Date Noted  . 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 Seizure (HCC) 05/22/2015  . Alcohol withdrawal (HCC) 05/22/2015  . Peripheral neuropathy (HCC) 05/10/2015  . Seizure disorder (HCC) 05/10/2015  . Acute alcoholic pancreatitis 03/21/2015  . Hypokalemia 03/21/2015  . Hypomagnesemia 03/21/2015  . Alcohol abuse 01/04/2015  . Alcohol withdrawal seizure (HCC) 01/04/2015  . Anxiety 01/04/2015  . Alcoholic hepatitis 01/04/2015  . Alcohol dependence (HCC) 01/04/2015  . Elevated LFTs   . Polysubstance abuse     Past Surgical History   Procedure Laterality Date  . No past surgeries      Current Outpatient Rx  Name  Route  Sig  Dispense  Refill  . busPIRone (BUSPAR) 10 MG tablet   Oral   Take 1 tablet (10 mg total) by mouth 3 (three) times daily.   90 tablet   0   . carbamazepine (TEGRETOL) 200 MG tablet   Oral   Take 1 tablet (200 mg total) by mouth 2 (two) times daily.   60 tablet   0   . folic acid (FOLVITE) 1 MG tablet   Oral   Take 1 tablet (1 mg total) by mouth daily.   30 tablet   0   . hydrALAZINE (APRESOLINE) 25 MG tablet   Oral   Take 1 tablet (25 mg total) by mouth 3 (three) times daily.   90 tablet   0   . hydrOXYzine (VISTARIL) 50 MG capsule   Oral   Take 1 capsule (50 mg total) by mouth 3 (three) times daily as needed for anxiety.   30 capsule   0   . Multiple Vitamin (MULTIVITAMIN WITH MINERALS) TABS tablet   Oral   Take 1 tablet by mouth daily.   30 tablet   0   . acetaminophen (TYLENOL) 325 MG tablet   Oral   Take 2 tablets (650 mg total) by mouth every 6 (six) hours as needed for mild pain (or Fever >/= 101). Patient not taking: Reported on 03/21/2015   1 tablet  0   . atenolol (TENORMIN) 50 MG tablet   Oral   Take 1 tablet (50 mg total) by mouth daily. Patient not taking: Reported on 03/21/2015   30 tablet   0   . clonazePAM (KLONOPIN) 2 MG tablet   Oral   Take 1 tablet (2 mg total) by mouth 2 (two) times daily. Patient not taking: Reported on 03/21/2015   30 tablet   0   . ondansetron (ZOFRAN) 4 MG tablet      Take 1-2 tabs by mouth every 8 hours as needed for nausea/vomiting Patient not taking: Reported on 03/21/2015   30 tablet   0   . oxyCODONE-acetaminophen (PERCOCET/ROXICET) 5-325 MG per tablet   Oral   Take 1 tablet by mouth every 4 (four) hours as needed for moderate pain. Patient not taking: Reported on 05/22/2015   20 tablet   0   . pantoprazole (PROTONIX) 40 MG tablet   Oral   Take 1 tablet (40 mg total) by mouth 2 (two) times daily. Patient  not taking: Reported on 05/22/2015   60 tablet   0   . potassium chloride SA (KLOR-CON M20) 20 MEQ tablet   Oral   Take 1 tablet (20 mEq total) by mouth 2 (two) times daily. Patient not taking: Reported on 03/21/2015   14 tablet   0     Allergies Review of patient's allergies indicates no known allergies.  Family History  Problem Relation Age of Onset  . Diabetes Mother   . Liver disease Neg Hx   . Colon cancer Neg Hx   . Lung cancer Father     Social History Social History  Substance Use Topics  . Smoking status: Current Every Day Smoker -- 1.00 packs/day for 10 years    Types: Cigarettes  . Smokeless tobacco: Never Used  . Alcohol Use: Yes     Comment: daily    Review of Systems Constitutional: No fever/chills Eyes: No visual changes. ENT: No sore throat. Cardiovascular: Denies chest pain. Respiratory: Denies shortness of breath. Gastrointestinal: No abdominal pain.  No diarrhea.  No constipation. Genitourinary: Negative for dysuria. Musculoskeletal: Negative for back pain. Skin: Negative for rash. Neurological: Negative for headaches, focal weakness or numbness.  10-point ROS otherwise negative.  ____________________________________________   PHYSICAL EXAM:  VITAL SIGNS: ED Triage Vitals  Enc Vitals Group     BP 08/17/15 2054 144/100 mmHg     Pulse Rate 08/17/15 2054 109     Resp 08/17/15 2054 18     Temp 08/17/15 2054 98.6 F (37 C)     Temp Source 08/17/15 2054 Oral     SpO2 08/17/15 2054 98 %     Weight 08/17/15 2054 175 lb (79.379 kg)     Height 08/17/15 2054  (1.803 m)     Head Cir --      Peak Flow --      Pain Score 08/17/15 2100 0     Pain Loc --      Pain Edu? --      Excl. in [place]? --     Constitutional: Alert and oriented. Well appearing and in no acute distress. Eyes: Conjunctivae are normal. PERRL. EOMI. Head: Atraumatic. Nose: No congestion/rhinnorhea. Mouth/Throat: Mucous membranes are moist.  Oropharynx  non-erythematous. Neck: No stridor.   Cardiovascular: Normal rate, regular rhythm. Grossly normal heart sounds.  Good peripheral circulation. Respiratory: Normal respiratory effort.  No retractions. Lungs CTAB. Gastrointestinal: Soft and nontender. No distention.  Musculoskeletal: No lower extremity tenderness nor edema.  No joint effusions. Neurologic:  Normal speech and language. No gross focal neurologic deficits are appreciated. No gait instability. Skin:  Skin is warm, dry and intact. No rash noted. Psychiatric: Mood and affect are normal. Speech and behavior are normal.  ____________________________________________   LABS (all labs ordered are listed, but only abnormal results are displayed)  Labs Reviewed  COMPREHENSIVE METABOLIC PANEL - Abnormal; Notable for the following:    Chloride 100 (*)    Glucose, Bld 115 (*)    Total Protein 8.2 (*)    All other components within normal limits  CBC - Abnormal; Notable for the following:    RDW 15.3 (*)    All other components within normal limits  LIPASE, BLOOD  URINALYSIS COMPLETEWITH MICROSCOPIC ([facility] ONLY)  CARBAMAZEPINE LEVEL, TOTAL  TYPE AND SCREEN   ____________________________________________  EKG   ____________________________________________  RADIOLOGY   ____________________________________________   PROCEDURES    ____________________________________________   INITIAL IMPRESSION / ASSESSMENT AND PLAN / ED COURSE  Pertinent labs & imaging results that were available during my care of the patient were reviewed by me and considered in my medical decision making (see chart for details).  Began octreotide and protonix infusions. My concern is that the patient may have varices or bleeding ulcer. I'm especially concerned about this considering his heavy drinking history.  ----------------------------------------- 11:19 PM on 08/17/2015 -----------------------------------------  Signed out to Dr.  Judithann Sheen. ____________________________________________   FINAL CLINICAL IMPRESSION(S) / ED DIAGNOSES  Hematemesis    Myrna Blazer, MD 08/17/15 [phone]

## 2015-08-18 ENCOUNTER — Observation Stay: Payer: Self-pay | Admitting: Anesthesiology

## 2015-08-18 ENCOUNTER — Encounter
Admission: EM | Disposition: A | Discharge status: Home / Self Care | Payer: Self-pay | Source: Home / Self Care | Attending: Emergency Medicine

## 2015-08-18 ENCOUNTER — Encounter: Payer: Self-pay | Admitting: *Deleted

## 2015-08-18 HISTORY — PX: ESOPHAGOGASTRODUODENOSCOPY: SHX5428

## 2015-08-18 LAB — COMPREHENSIVE METABOLIC PANEL
ALK PHOS: 64 U/L (ref 38–126)
ALT: 26 U/L (ref 17–63)
ANION GAP: 13 (ref 5–15)
AST: 30 U/L (ref 15–41)
Albumin: 3.6 g/dL (ref 3.5–5.0)
BILIRUBIN TOTAL: 0.9 mg/dL (ref 0.3–1.2)
BUN: 11 mg/dL (ref 6–20)
CALCIUM: 7.9 mg/dL — AB (ref 8.9–10.3)
CO2: 22 mmol/L (ref 22–32)
Chloride: 103 mmol/L (ref 101–111)
Creatinine, Ser: 0.92 mg/dL (ref 0.61–1.24)
Glucose, Bld: 122 mg/dL — ABNORMAL HIGH (ref 65–99)
POTASSIUM: 3.8 mmol/L (ref 3.5–5.1)
Sodium: 138 mmol/L (ref 135–145)
TOTAL PROTEIN: 6.6 g/dL (ref 6.5–8.1)

## 2015-08-18 LAB — TYPE AND SCREEN
ABO/RH(D): O POS
ANTIBODY SCREEN: NEGATIVE

## 2015-08-18 LAB — CBC
HEMATOCRIT: 37.1 % — AB (ref 40.0–52.0)
HEMOGLOBIN: 12.6 g/dL — AB (ref 13.0–18.0)
MCH: 31.5 pg (ref 26.0–34.0)
MCHC: 34 g/dL (ref 32.0–36.0)
MCV: 92.7 fL (ref 80.0–100.0)
Platelets: 258 10*3/uL (ref 150–440)
RBC: 4 MIL/uL — ABNORMAL LOW (ref 4.40–5.90)
RDW: 15.7 % — AB (ref 11.5–14.5)
WBC: 8.6 10*3/uL (ref 3.8–10.6)

## 2015-08-18 LAB — CARBAMAZEPINE LEVEL, TOTAL: CARBAMAZEPINE LVL: 6.1 ug/mL (ref 4.0–12.0)

## 2015-08-18 LAB — ABO/RH: ABO/RH(D): O POS

## 2015-08-18 SURGERY — EGD (ESOPHAGOGASTRODUODENOSCOPY)
Anesthesia: General | Laterality: Left

## 2015-08-18 MED ORDER — LACTATED RINGERS IV SOLN
INTRAVENOUS | Status: DC | PRN
  Administered 2015-08-18: 12:00:00 via INTRAVENOUS

## 2015-08-18 MED ORDER — HYDROXYZINE PAMOATE 50 MG PO CAPS
50.0000 mg | ORAL_CAPSULE | Freq: Three times a day (TID) | ORAL | Status: DC | PRN
  Filled 2015-08-18: qty 1

## 2015-08-18 MED ORDER — BISACODYL 10 MG RE SUPP: 10.0000 mg | Freq: Every day | RECTAL | Status: DC | PRN

## 2015-08-18 MED ORDER — ONDANSETRON HCL 4 MG PO TABS: 4.0000 mg | ORAL_TABLET | Freq: Three times a day (TID) | ORAL | Status: DC | PRN

## 2015-08-18 MED ORDER — OXYCODONE-ACETAMINOPHEN 5-325 MG PO TABS: 1.0000 | ORAL_TABLET | ORAL | Status: DC | PRN

## 2015-08-18 MED ORDER — MIDAZOLAM HCL 2 MG/2ML IJ SOLN
INTRAMUSCULAR | Status: DC | PRN
  Administered 2015-08-18: 1 mg via INTRAVENOUS

## 2015-08-18 MED ORDER — BUSPIRONE HCL 10 MG PO TABS
10.0000 mg | ORAL_TABLET | Freq: Three times a day (TID) | ORAL | Status: DC
  Administered 2015-08-18: 10 mg via ORAL
  Filled 2015-08-18 (×4): qty 1

## 2015-08-18 MED ORDER — PANTOPRAZOLE SODIUM 40 MG PO TBEC: 40.0000 mg | DELAYED_RELEASE_TABLET | Freq: Every day | ORAL | Status: DC

## 2015-08-18 MED ORDER — FOLIC ACID 1 MG PO TABS
1.0000 mg | ORAL_TABLET | Freq: Every day | ORAL | Status: DC
  Administered 2015-08-18: 14:00:00 1 mg via ORAL
  Filled 2015-08-18: qty 1

## 2015-08-18 MED ORDER — ADULT MULTIVITAMIN W/MINERALS CH
1.0000 | ORAL_TABLET | Freq: Every day | ORAL | Status: DC
  Administered 2015-08-18: 14:00:00 1 via ORAL
  Filled 2015-08-18: qty 1

## 2015-08-18 MED ORDER — CARBAMAZEPINE 200 MG PO TABS
200.0000 mg | ORAL_TABLET | Freq: Two times a day (BID) | ORAL | Status: DC
  Administered 2015-08-18: 200 mg via ORAL
  Filled 2015-08-18 (×3): qty 1

## 2015-08-18 MED ORDER — HYDRALAZINE HCL 25 MG PO TABS
25.0000 mg | ORAL_TABLET | Freq: Three times a day (TID) | ORAL | Status: DC
  Administered 2015-08-18: 25 mg via ORAL
  Filled 2015-08-18: qty 1

## 2015-08-18 MED ORDER — FENTANYL CITRATE (PF) 100 MCG/2ML IJ SOLN
INTRAMUSCULAR | Status: DC | PRN
  Administered 2015-08-18: 50 ug via INTRAVENOUS

## 2015-08-18 MED ORDER — HYDROMORPHONE HCL 1 MG/ML IJ SOLN: 1.0000 mg | INTRAMUSCULAR | Status: DC | PRN

## 2015-08-18 MED ORDER — ONDANSETRON HCL 4 MG/2ML IJ SOLN: 4.0000 mg | Freq: Four times a day (QID) | INTRAMUSCULAR | Status: DC | PRN

## 2015-08-18 MED ORDER — OXYCODONE-ACETAMINOPHEN 5-325 MG PO TABS
1.0000 | ORAL_TABLET | ORAL | Status: DC | PRN
Start: 2015-08-18 — End: 2015-11-01

## 2015-08-18 MED ORDER — ACETAMINOPHEN 650 MG RE SUPP: 650.0000 mg | Freq: Four times a day (QID) | RECTAL | Status: DC | PRN

## 2015-08-18 MED ORDER — PROPOFOL 10 MG/ML IV BOLUS
INTRAVENOUS | Status: DC | PRN
  Administered 2015-08-18: 100 mg via INTRAVENOUS
  Administered 2015-08-18: 50 mg via INTRAVENOUS

## 2015-08-18 MED ORDER — ONDANSETRON HCL 4 MG PO TABS: 4.0000 mg | ORAL_TABLET | Freq: Four times a day (QID) | ORAL | Status: DC | PRN

## 2015-08-18 MED ORDER — DOCUSATE SODIUM 100 MG PO CAPS
100.0000 mg | ORAL_CAPSULE | Freq: Two times a day (BID) | ORAL | Status: DC
  Filled 2015-08-18: qty 1

## 2015-08-18 MED ORDER — ACETAMINOPHEN 325 MG PO TABS: 650.0000 mg | ORAL_TABLET | Freq: Four times a day (QID) | ORAL | Status: DC | PRN

## 2015-08-18 NOTE — Anesthesia Preprocedure Evaluation (Signed)
Anesthesia Evaluation  Patient identified by MRN, date of birth, ID band Patient awake    Reviewed: Allergy & Precautions, NPO status , Patient's Chart, lab work & pertinent test results  Airway Mallampati: II       Dental  (+) Teeth Intact   Pulmonary Current Smoker,    + rhonchi        Cardiovascular Exercise Tolerance: Good hypertension, Pt. on medications  Rhythm:Regular Rate:Normal     Neuro/Psych Seizures -,  Anxiety    GI/Hepatic (+) Hepatitis -, C  Endo/Other  negative endocrine ROS  Renal/GU negative Renal ROS     Musculoskeletal   Abdominal Normal abdominal exam  (+)   Peds  Hematology   Anesthesia Other Findings   Reproductive/Obstetrics                             Anesthesia Physical Anesthesia Plan  ASA: III  Anesthesia Plan: General   Post-op Pain Management:    Induction: Intravenous  Airway Management Planned: Natural Airway and Nasal Cannula  Additional Equipment:   Intra-op Plan:   Post-operative Plan:   Informed Consent: I have reviewed the patients History and Physical, chart, labs and discussed the procedure including the risks, benefits and alternatives for the proposed anesthesia with the patient or authorized representative who has indicated his/her understanding and acceptance.     Plan Discussed with: CRNA  Anesthesia Plan Comments:         Anesthesia Quick Evaluation

## 2015-08-18 NOTE — Progress Notes (Signed)
Initial Nutrition Assessment   INTERVENTION:   Meals and Snacks: Cater to patient preferences on regular diet   NUTRITION DIAGNOSIS:   Inadequate oral intake related to acute illness as evidenced by  (NPO this am).  GOAL:   Patient will meet greater than or equal to 90% of their needs  MONITOR:    (Energy Intake, Digestive system)  REASON FOR ASSESSMENT:   Malnutrition Screening Tool    ASSESSMENT:    Pt admitted with vomitting and possible GI bleed, pt s/p EGD c/w gastritis and esophagitis per MD note. Pt now with discharge orders placed.  Past Medical History  Diagnosis Date  . Seizures (HCC)   . Polysubstance abuse   . Pancreatitis   . Asthma   . Alcohol dependence (HCC)   . Hypertension      Diet Order:  Diet regular Room service appropriate?: Yes; Fluid consistency:: Thin  Current Nutrition: Pt NPO this am,   Food/Nutrition-Related History: Pt reports usually eating 2 meals per day PTA. Last meal eaten a day and a half ago. Pt reports to Clinical research associate that he stopped drinking one month ago and since his appetite has increased greatly.   Scheduled Medications:  . busPIRone  10 mg Oral TID  . carbamazepine  200 mg Oral BID  . docusate sodium  100 mg Oral BID  . folic acid  1 mg Oral Daily  . hydrALAZINE  25 mg Oral TID  . multivitamin with minerals  1 tablet Oral Daily  . [START ON 08/21/2015] pantoprazole (PROTONIX) IV  40 mg Intravenous Q12H    Continuous Medications:  . 0.45 % NaCl with KCl 20 mEq / L 100 mL/hr at 08/18/15 0220  . pantoprozole (PROTONIX) infusion 8 mg/hr (08/18/15 0928)     Electrolyte/Renal Profile and Glucose Profile:   Recent Labs Lab 08/17/15 2103 08/18/15 0609  NA 142 138  K 3.5 3.8  CL 100* 103  CO2 28 22  BUN 12 11  CREATININE 0.92 0.92  CALCIUM 9.0 7.9*  GLUCOSE 115* 122*   Protein Profile:  Recent Labs Lab 08/17/15 2103 08/18/15 0609  ALBUMIN 4.4 3.6     Weight Change: Pt reports UBW of 180-185lbs over a  year ago. Per [facility] weight encounters weight recently has been stable.    Height:   Ht Readings from Last 1 Encounters:  08/17/15  (1.803 m)    Weight:   Wt Readings from Last 1 Encounters:  08/18/15 170 lb (77.111 kg)    BMI:  Body mass index is 23.72 kg/(m^2).   EDUCATION NEEDS:   No education needs identified at this time   LOW Care Level  Leda Quail, RD, LDN Pager [phone] Weekend/On-Call Pager [phone]

## 2015-08-18 NOTE — Plan of Care (Signed)
Problem: Education: Goal: Knowledge of [facility] Education information/materials will improve Outcome: Progressing Pt likes to be called Casimiro Needle    Past Medical History   Diagnosis  Date   .  Seizures (HCC)     .  Polysubstance abuse     .  Pancreatitis     .  Asthma     .  Alcohol dependence (HCC)     .  Hypertension             Pt is well controlled with home medications.

## 2015-08-18 NOTE — Consult Note (Signed)
GI Inpatient Consult Note  Reason for Consult: Hematemesis.   Attending Requesting Consult:  History of Present Illness: Johnathan Rivera is a 30 y.o. male with hx of alcohol abuse with recurrent admissions for pancreatitis. Now with 2 episodes of hematemesis last night. No abdominal pain or nausea/vomiting this AM. Had a pint of liquor few days ago. No signs of withdrawal. Denies recent ASA/NSAIDS use.  Past Medical History:  Past Medical History  Diagnosis Date  . Seizures (HCC)   . Polysubstance abuse   . Pancreatitis   . Asthma   . Alcohol dependence (HCC)   . Hypertension     Problem List: Patient Active Problem List   Diagnosis Date Noted  . GI bleed 08/17/2015  . Hematemesis 08/17/2015  . Nausea & vomiting 08/17/2015  . 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 Seizure (HCC) 05/22/2015  . Alcohol withdrawal (HCC) 05/22/2015  . Peripheral neuropathy (HCC) 05/10/2015  . Seizure disorder (HCC) 05/10/2015  . Acute alcoholic pancreatitis 03/21/2015  . Hypokalemia 03/21/2015  . Hypomagnesemia 03/21/2015  . Alcohol abuse 01/04/2015  . Alcohol withdrawal seizure (HCC) 01/04/2015  . Anxiety 01/04/2015  . Alcoholic hepatitis 01/04/2015  . Alcohol dependence (HCC) 01/04/2015  . Elevated LFTs   . Polysubstance abuse     Past Surgical History: Past Surgical History  Procedure Laterality Date  . No past surgeries      Allergies: No Known Allergies  Home Medications: Prescriptions prior to admission  Medication Sig Dispense Refill Last Dose  . busPIRone (BUSPAR) 10 MG tablet Take 1 tablet (10 mg total) by mouth 3 (three) times daily. 90 tablet 0 08/17/2015 at Unknown time  . carbamazepine (TEGRETOL) 200 MG tablet Take 1 tablet (200 mg total) by mouth 2 (two) times daily. 60 tablet 0 08/17/2015 at Unknown time  . folic acid (FOLVITE) 1 MG tablet Take 1 tablet (1 mg total) by mouth daily. 30 tablet 0 08/17/2015 at Unknown time  . hydrALAZINE (APRESOLINE) 25  MG tablet Take 1 tablet (25 mg total) by mouth 3 (three) times daily. 90 tablet 0 08/17/2015 at Unknown time  . hydrOXYzine (VISTARIL) 50 MG capsule Take 1 capsule (50 mg total) by mouth 3 (three) times daily as needed for anxiety. 30 capsule 0 08/17/2015 at Unknown time  . Multiple Vitamin (MULTIVITAMIN WITH MINERALS) TABS tablet Take 1 tablet by mouth daily. 30 tablet 0 08/17/2015 at Unknown time  . acetaminophen (TYLENOL) 325 MG tablet Take 2 tablets (650 mg total) by mouth every 6 (six) hours as needed for mild pain (or Fever >/= 101). (Patient not taking: Reported on 03/21/2015) 1 tablet 0 Not Taking  . atenolol (TENORMIN) 50 MG tablet Take 1 tablet (50 mg total) by mouth daily. (Patient not taking: Reported on 03/21/2015) 30 tablet 0 Not Taking  . clonazePAM (KLONOPIN) 2 MG tablet Take 1 tablet (2 mg total) by mouth 2 (two) times daily. (Patient not taking: Reported on 03/21/2015) 30 tablet 0 Not Taking  . ondansetron (ZOFRAN) 4 MG tablet Take 1-2 tabs by mouth every 8 hours as needed for nausea/vomiting (Patient not taking: Reported on 03/21/2015) 30 tablet 0 Not Taking  . oxyCODONE-acetaminophen (PERCOCET/ROXICET) 5-325 MG per tablet Take 1 tablet by mouth every 4 (four) hours as needed for moderate pain. (Patient not taking: Reported on 05/22/2015) 20 tablet 0   . pantoprazole (PROTONIX) 40 MG tablet Take 1 tablet (40 mg total) by mouth 2 (two) times daily. (Patient not taking: Reported on 05/22/2015) 60 tablet 0   .  potassium chloride SA (KLOR-CON M20) 20 MEQ tablet Take 1 tablet (20 mEq total) by mouth 2 (two) times daily. (Patient not taking: Reported on 03/21/2015) 14 tablet 0 Not Taking   Home medication reconciliation was completed with the patient.   Scheduled Inpatient Medications:   . [MAR Hold] busPIRone  10 mg Oral TID  . [MAR Hold] carbamazepine  200 mg Oral BID  . [MAR Hold] docusate sodium  100 mg Oral BID  . [MAR Hold] folic acid  1 mg Oral Daily  . [MAR Hold] hydrALAZINE  25 mg  Oral TID  . [MAR Hold] multivitamin with minerals  1 tablet Oral Daily  . [MAR Hold] pantoprazole (PROTONIX) IV  40 mg Intravenous Q12H    Continuous Inpatient Infusions:   . 0.45 % NaCl with KCl 20 mEq / L 100 mL/hr at 08/18/15 0220  . pantoprozole (PROTONIX) infusion 8 mg/hr (08/18/15 0928)    PRN Inpatient Medications:  [MAR Hold] acetaminophen **OR** [MAR Hold] acetaminophen, [MAR Hold] bisacodyl, [MAR Hold]  HYDROmorphone (DILAUDID) injection, [MAR Hold] hydrOXYzine, [MAR Hold] LORazepam, [MAR Hold] ondansetron **OR** [MAR Hold] ondansetron (ZOFRAN) IV, [MAR Hold] oxyCODONE-acetaminophen  Family History: family history includes Diabetes in his mother; Lung cancer in his father. There is no history of Liver disease or Colon cancer.  The patient's family history is negative for inflammatory bowel disorders, GI malignancy, or solid organ transplantation.  Social History:   reports that he has been smoking Cigarettes.  He has a 10 pack-year smoking history. He has never used smokeless tobacco. He reports that he drinks alcohol. He reports that he uses illicit drugs (Marijuana and Benzodiazepines). The patient denies ETOH, tobacco, or drug use.   Review of Systems: Constitutional: Weight is stable.  Eyes: No changes in vision. ENT: No oral lesions, sore throat.  GI: see HPI.  Heme/Lymph: No easy bruising.  CV: No chest pain.  GU: No hematuria.  Integumentary: No rashes.  Neuro: No headaches.  Psych: No depression/anxiety.  Endocrine: No heat/cold intolerance.  Allergic/Immunologic: No urticaria.  Resp: No cough, SOB.  Musculoskeletal: No joint swelling.    Physical Examination: BP 144/89 mmHg  Pulse 93  Temp(Src) 98.7 F (37.1 C) (Oral)  Resp 20  Ht  (1.803 m)  Wt 77.111 kg (170 lb)  BMI 23.72 kg/m2  SpO2 98% Gen: NAD, alert and oriented x 4 HEENT: PEERLA, EOMI, Neck: supple, no JVD or thyromegaly Chest: CTA bilaterally, no wheezes, crackles, or other  adventitious sounds CV: RRR, no m/g/c/r Abd: soft, Nontender throughout, ND, +BS in all four quadrants; no HSM, guarding, ridigity, or rebound tenderness Ext: no edema, well perfused with 2+ pulses, Skin: no rash or lesions noted Lymph: no LAD  Data: Lab Results  Component Value Date   WBC 8.6 08/18/2015   HGB 12.6* 08/18/2015   HCT 37.1* 08/18/2015   MCV 92.7 08/18/2015   PLT 258 08/18/2015    Recent Labs Lab 08/17/15 2103 08/18/15 0609  HGB 15.1 12.6*   Lab Results  Component Value Date   NA 138 08/18/2015   K 3.8 08/18/2015   CL 103 08/18/2015   CO2 22 08/18/2015   BUN 11 08/18/2015   CREATININE 0.92 08/18/2015   Lab Results  Component Value Date   ALT 26 08/18/2015   AST 30 08/18/2015   ALKPHOS 64 08/18/2015   BILITOT 0.9 08/18/2015   No results for input(s): APTT, INR, PTT in the last 168 hours. Assessment/Plan: Mr. Adelson is a 30 y.o. male with  hematemesis last night. DDx incl M-W tear, bleeding ulcer disease, alcoholic gastritis, etc.  Recommendations: EGD this AM. PPI BID for now until EGD done. Alcohol rehab. Thank you for the consult. Please call with questions or concerns.  Kathryne Ramella, Ezzard Standing, MD

## 2015-08-18 NOTE — Transfer of Care (Signed)
Immediate Anesthesia Transfer of Care Note  Patient: Johnathan Rivera  Procedure(s) Performed: Procedure(s): ESOPHAGOGASTRODUODENOSCOPY (EGD) (Left)  Patient Location: PACU  Anesthesia Type:General  Level of Consciousness: awake and alert   Airway & Oxygen Therapy: Patient Spontanous Breathing  Post-op Assessment: Report given to RN  Post vital signs: Reviewed and stable  Last Vitals:  Filed Vitals:   08/18/15 0929 08/18/15 1143  BP: 144/89 128/77  Pulse:  84  Temp:  36.4 C  Resp:  16    Complications: No apparent anesthesia complications

## 2015-08-18 NOTE — Op Note (Signed)
[facility] Gastroenterology Patient Name: Johnathan Rivera Procedure Date: 08/18/2015 11:30 AM MRN: [id number] Account #: [id number] Date of Birth: September 23, 1985 Admit Type: Inpatient Age: 30 Room: [facility] ENDO ROOM 4 Gender: Male Note Status: Finalized Procedure:            Upper GI endoscopy Indications:          Hematemesis Providers:            Ezzard Standing. Bluford Kaufmann, MD Referring MD:         Sallye Lat Md, MD (Referring MD) Medicines:            Monitored Anesthesia Care Complications:        No immediate complications. Procedure:            Pre-Anesthesia Assessment:                       - Prior to the procedure, a History and Physical was                        performed, and patient medications, allergies and                        sensitivities were reviewed. The patient's tolerance of                        previous anesthesia was reviewed.                       - The risks and benefits of the procedure and the                        sedation options and risks were discussed with the                        patient. All questions were answered and informed                        consent was obtained.                       - After reviewing the risks and benefits, the patient                        was deemed in satisfactory condition to undergo the                        procedure.                       After obtaining informed consent, the endoscope was                        passed under direct vision. Throughout the procedure,                        the patient's blood pressure, pulse, and oxygen                        saturations were monitored continuously. The Endoscope  was introduced through the mouth, and advanced to the                        second part of duodenum. The upper GI endoscopy was                        accomplished without difficulty. The patient tolerated                        the procedure well. Findings:      LA Grade  A (one or more mucosal breaks less than 5 mm, not extending       between tops of 2 mucosal folds) esophagitis was found. Biopsies were       taken with a cold forceps for histology.      The exam was otherwise without abnormality.      Localized mildly erythematous mucosa with stigmata of recent bleeding       was found in the gastric body.      The exam was otherwise without abnormality.      The examined duodenum was normal. Impression:           - LA Grade A reflux esophagitis. Biopsied.                       - The examination was otherwise normal.                       - Erythematous mucosa in the gastric body.                       - The examination was otherwise normal.                       - Normal examined duodenum. Recommendation:       - Observe patient's clinical course.                       - Continue present medications.                       - The findings and recommendations were discussed with                        the patient. Procedure Code(s):    --- Professional ---                       [phone], Esophagogastroduodenoscopy, flexible, transoral;                        with biopsy, single or multiple Diagnosis Code(s):    --- Professional ---                       K21.0, Gastro-esophageal reflux disease with esophagitis                       K31.89, Other diseases of stomach and duodenum                       K92.0, Hematemesis CPT copyright 2016 [employer]. All rights reserved. The codes documented in this report are preliminary and upon coder review may  be revised to meet current compliance requirements. Wallace Cullens, MD 08/18/2015 11:41:36 AM This report has been signed electronically. Number of Addenda: 0 Note Initiated On: 08/18/2015 11:30 AM      [facility]

## 2015-08-18 NOTE — Progress Notes (Signed)
Patient is to be discharged home today. Patient is in no acute distress at this time, and assessment is unchanged from this morning. Patient's IV is out, discharge paperwork has been discussed with patient/family and there are no questions or concerns at this time. Patient will be accompanied downstairs by staff and family via wheelchair.   

## 2015-08-18 NOTE — Op Note (Signed)
EGD showed reflux esophagitis and gastritis. No active bleeding now. No bleeding ulcers. Bx taken. Continue daily PPI. Must quit alcohol. Regular diet ordered. Will sign off. thanks

## 2015-08-18 NOTE — Discharge Instructions (Signed)
Gastrointestinal Bleeding °Gastrointestinal bleeding is bleeding somewhere along the path that food travels through the body (digestive tract). This path is anywhere between the mouth and the opening of the butt (anus). You may have blood in your throw up (vomit) or in your poop (stools). If there is a lot of bleeding, you may need to stay in the hospital. °HOME CARE °· Only take medicine as told by your doctor. °· Eat foods with fiber such as whole grains, fruits, and vegetables. You can also try eating 1 to 3 prunes a day. °· Drink enough fluids to keep your pee (urine) clear or pale yellow. °GET HELP RIGHT AWAY IF:  °· Your bleeding gets worse. °· You feel dizzy, weak, or you pass out (faint). °· You have bad cramps in your back or belly (abdomen). °· You have large blood clumps (clots) in your poop. °· Your problems are getting worse. °MAKE SURE YOU:  °· Understand these instructions. °· Will watch your condition. °· Will get help right away if you are not doing well or get worse. °  °This information is not intended to replace advice given to you by your health care provider. Make sure you discuss any questions you have with your health care provider. °  °Document Released: 03/26/2008 Document Revised: 06/03/2012 Document Reviewed: 12/05/2014 °Elsevier Interactive Patient Education ©2016 [employer] ° °

## 2015-08-18 NOTE — Discharge Summary (Signed)
[facility] - [place] at [facility]   PATIENT NAME: Johnathan Rivera    MR#:  [id number]  DATE OF BIRTH:  1985-09-08  DATE OF ADMISSION:  08/17/2015 ADMITTING PHYSICIAN: Marguarite Arbour, MD  DATE OF DISCHARGE: No discharge date for patient encounter.  PRIMARY CARE PHYSICIAN: No PCP Per Patient    ADMISSION DIAGNOSIS:  Hematemesis  DISCHARGE DIAGNOSIS:  Upper GI bleed secondary to alcoholic gastritis-improved  SECONDARY DIAGNOSIS:   Past Medical History  Diagnosis Date  . Seizures (HCC)   . Polysubstance abuse   . Pancreatitis   . Asthma   . Alcohol dependence (HCC)   . Hypertension     HOSPITAL COURSE:  Johnathan Rivera  is a 30 y.o. male admitted 08/17/2015 with chief complaint nausea vomiting followed by hematemesis. Please see H&P performed by Dr. Judithann Sheen further information. His mid to the hospital in hemodynamically stable condition, started on Protonix as well as octreotide infusions. Octreotide discontinued given no history of esophageal varices. Gastroenterology consult for endoscopy revealing gastritis no active bleeding. Patient diet advanced to regular advised to continue daily PPI therapy  DISCHARGE CONDITIONS:   Stable/improved  CONSULTS OBTAINED:  Treatment Team:  Wallace Cullens, MD  DRUG ALLERGIES:  No Known Allergies  DISCHARGE MEDICATIONS:   Current Discharge Medication List    CONTINUE these medications which have CHANGED   Details  ondansetron (ZOFRAN) 4 MG tablet Take 1 tablet (4 mg total) by mouth every 8 (eight) hours as needed for nausea or vomiting. Take 1-2 tabs by mouth every 8 hours as needed for nausea/vomiting Qty: 30 tablet, Refills: 0    oxyCODONE-acetaminophen (PERCOCET/ROXICET) 5-325 MG tablet Take 1 tablet by mouth every 4 (four) hours as needed for moderate pain. Qty: 30 tablet, Refills: 0    pantoprazole (PROTONIX) 40 MG tablet Take 1 tablet (40 mg total) by mouth daily. Qty: 60 tablet, Refills: 0       CONTINUE these medications which have NOT CHANGED   Details  busPIRone (BUSPAR) 10 MG tablet Take 1 tablet (10 mg total) by mouth 3 (three) times daily. Qty: 90 tablet, Refills: 0    carbamazepine (TEGRETOL) 200 MG tablet Take 1 tablet (200 mg total) by mouth 2 (two) times daily. Qty: 60 tablet, Refills: 0    folic acid (FOLVITE) 1 MG tablet Take 1 tablet (1 mg total) by mouth daily. Qty: 30 tablet, Refills: 0    hydrALAZINE (APRESOLINE) 25 MG tablet Take 1 tablet (25 mg total) by mouth 3 (three) times daily. Qty: 90 tablet, Refills: 0    hydrOXYzine (VISTARIL) 50 MG capsule Take 1 capsule (50 mg total) by mouth 3 (three) times daily as needed for anxiety. Qty: 30 capsule, Refills: 0    Multiple Vitamin (MULTIVITAMIN WITH MINERALS) TABS tablet Take 1 tablet by mouth daily. Qty: 30 tablet, Refills: 0    clonazePAM (KLONOPIN) 2 MG tablet Take 1 tablet (2 mg total) by mouth 2 (two) times daily. Qty: 30 tablet, Refills: 0      STOP taking these medications     acetaminophen (TYLENOL) 325 MG tablet      atenolol (TENORMIN) 50 MG tablet      potassium chloride SA (KLOR-CON M20) 20 MEQ tablet          DISCHARGE INSTRUCTIONS:    DIET:  Regular diet  DISCHARGE CONDITION:  Stable  ACTIVITY:  Activity as tolerated  OXYGEN:  Home Oxygen: No.   Oxygen Delivery: room air  DISCHARGE LOCATION:  home   If you experience worsening of your admission symptoms, develop shortness of breath, life threatening emergency, suicidal or homicidal thoughts you must seek medical attention immediately by calling 911 or calling your MD immediately  if symptoms less severe.  You Must read complete instructions/literature along with all the possible adverse reactions/side effects for all the Medicines you take and that have been prescribed to you. Take any new Medicines after you have completely understood and accpet all the possible adverse reactions/side effects.   Please note  You  were cared for by a hospitalist during your hospital stay. If you have any questions about your discharge medications or the care you received while you were in the hospital after you are discharged, you can call the unit and asked to speak with the hospitalist on call if the hospitalist that took care of you is not available. Once you are discharged, your primary care physician will handle any further medical issues. Please note that NO REFILLS for any discharge medications will be authorized once you are discharged, as it is imperative that you return to your primary care physician (or establish a relationship with a primary care physician if you do not have one) for your aftercare needs so that they can reassess your need for medications and monitor your lab values.    On the day of Discharge:   VITAL SIGNS:  Blood pressure 143/95, pulse 88, temperature 97.8 F (36.6 C), temperature source Oral, resp. rate 16, height 5\' 11"  (1.803 m), weight 77.111 kg (170 lb), SpO2 100 %.  I/O:   Intake/Output Summary (Last 24 hours) at 08/18/15 1215 Last data filed at 08/18/15 1142  Gross per 24 hour  Intake 566.67 ml  Output      0 ml  Net 566.67 ml    PHYSICAL EXAMINATION:  GENERAL:  30 y.o.-year-old patient lying in the bed with no acute distress.  EYES: Pupils equal, round, reactive to light and accommodation. No scleral icterus. Extraocular muscles intact.  HEENT: Head atraumatic, normocephalic. Oropharynx and nasopharynx clear.  NECK:  Supple, no jugular venous distention. No thyroid enlargement, no tenderness.  LUNGS: Normal breath sounds bilaterally, no wheezing, rales,rhonchi or crepitation. No use of accessory muscles of respiration.  CARDIOVASCULAR: S1, S2 normal. No murmurs, rubs, or gallops.  ABDOMEN: Soft, non-tender, non-distended. Bowel sounds present. No organomegaly or mass.  EXTREMITIES: No pedal edema, cyanosis, or clubbing.  NEUROLOGIC: Cranial nerves II through XII are intact.  Muscle strength 5/5 in all extremities. Sensation intact. Gait not checked.  PSYCHIATRIC: The patient is alert and oriented x 3.  SKIN: No obvious rash, lesion, or ulcer.   DATA REVIEW:   CBC  Recent Labs Lab 08/18/15 0609  WBC 8.6  HGB 12.6*  HCT 37.1*  PLT 258    Chemistries   Recent Labs Lab 08/18/15 0609  NA 138  K 3.8  CL 103  CO2 22  GLUCOSE 122*  BUN 11  CREATININE 0.92  CALCIUM 7.9*  AST 30  ALT 26  ALKPHOS 64  BILITOT 0.9    Cardiac Enzymes No results for input(s): TROPONINI in the last 168 hours.  Microbiology Results  Results for orders placed or performed during the hospital encounter of 03/21/15  MRSA PCR Screening     Status: None   Collection Time: 03/21/15  9:08 AM  Result Value Ref Range Status   MRSA by PCR NEGATIVE NEGATIVE Final    Comment:        The GeneXpert  MRSA Assay (FDA approved for NASAL specimens only), is one component of a comprehensive MRSA colonization surveillance program. It is not intended to diagnose MRSA infection nor to guide or monitor treatment for MRSA infections.     RADIOLOGY:  No results found.   Management plans discussed with the patient, family and they are in agreement.  CODE STATUS:     Code Status Orders        Start     Ordered   08/18/15 0135  Full code   Continuous     08/18/15 0134    Code Status History    Date Active Date Inactive Code Status Order ID Comments User Context   05/22/2015  4:00 PM 05/24/2015  1:18 PM Full Code [id number]  Shaune Pollack, MD Inpatient   03/21/2015  9:07 AM 03/23/2015  7:45 PM Full Code [id number]  Crissie Figures, MD Inpatient   01/04/2015 12:52 PM 01/06/2015  1:06 PM Full Code [id number]  Gale Journey, MD Inpatient      TOTAL TIME TAKING CARE OF THIS PATIENT: 28 minutes.    Johnathan Rivera,  Mardi Mainland.D on 08/18/2015 at 12:15 PM  Between 7am to 6pm - Pager - [phone]  After 6pm go to [email or web address] - password [email or web address] [facility]  [facility]   Office  [phone]  CC: Primary care physician; No PCP Per Patient

## 2015-08-18 NOTE — Anesthesia Postprocedure Evaluation (Signed)
Anesthesia Post Note  Patient: Johnathan Rivera  Procedure(s) Performed: Procedure(s) (LRB): ESOPHAGOGASTRODUODENOSCOPY (EGD) (Left)  Patient location during evaluation: PACU Anesthesia Type: General Level of consciousness: awake Pain management: pain level controlled Vital Signs Assessment: post-procedure vital signs reviewed and stable Cardiovascular status: stable Anesthetic complications: no    Last Vitals:  Filed Vitals:   08/18/15 0929 08/18/15 1143  BP: 144/89 128/77  Pulse:  84  Temp:  36.4 C  Resp:  16    Last Pain:  Filed Vitals:   08/18/15 1143  PainSc: 0-No pain                 VAN STAVEREN,Adin Laker

## 2015-08-21 LAB — SURGICAL PATHOLOGY

## 2015-08-22 ENCOUNTER — Encounter: Payer: Self-pay | Admitting: Gastroenterology

## 2015-09-20 ENCOUNTER — Encounter: Payer: Self-pay | Admitting: Emergency Medicine

## 2015-09-20 ENCOUNTER — Inpatient Hospital Stay
Admission: EM | Admit: 2015-09-20 | Discharge: 2015-09-21 | DRG: 101 | Disposition: A | Discharge status: Home / Self Care | Payer: Self-pay | Attending: Internal Medicine | Admitting: Internal Medicine

## 2015-09-20 DIAGNOSIS — F10232 Alcohol dependence with withdrawal with perceptual disturbance: Secondary | ICD-10-CM

## 2015-09-20 DIAGNOSIS — F10932 Alcohol use, unspecified with withdrawal with perceptual disturbance: Secondary | ICD-10-CM

## 2015-09-20 DIAGNOSIS — R4189 Other symptoms and signs involving cognitive functions and awareness: Secondary | ICD-10-CM | POA: Diagnosis present

## 2015-09-20 DIAGNOSIS — F17210 Nicotine dependence, cigarettes, uncomplicated: Secondary | ICD-10-CM | POA: Diagnosis present

## 2015-09-20 DIAGNOSIS — F1020 Alcohol dependence, uncomplicated: Secondary | ICD-10-CM | POA: Diagnosis present

## 2015-09-20 DIAGNOSIS — Y905 Blood alcohol level of 100-119 mg/100 ml: Secondary | ICD-10-CM | POA: Diagnosis present

## 2015-09-20 DIAGNOSIS — K219 Gastro-esophageal reflux disease without esophagitis: Secondary | ICD-10-CM | POA: Diagnosis present

## 2015-09-20 DIAGNOSIS — F419 Anxiety disorder, unspecified: Secondary | ICD-10-CM | POA: Diagnosis present

## 2015-09-20 DIAGNOSIS — I10 Essential (primary) hypertension: Secondary | ICD-10-CM | POA: Diagnosis present

## 2015-09-20 DIAGNOSIS — F1910 Other psychoactive substance abuse, uncomplicated: Secondary | ICD-10-CM | POA: Diagnosis present

## 2015-09-20 DIAGNOSIS — R404 Transient alteration of awareness: Secondary | ICD-10-CM | POA: Diagnosis present

## 2015-09-20 DIAGNOSIS — F1210 Cannabis abuse, uncomplicated: Secondary | ICD-10-CM | POA: Diagnosis present

## 2015-09-20 DIAGNOSIS — G40909 Epilepsy, unspecified, not intractable, without status epilepticus: Principal | ICD-10-CM

## 2015-09-20 DIAGNOSIS — F1010 Alcohol abuse, uncomplicated: Secondary | ICD-10-CM | POA: Diagnosis present

## 2015-09-20 HISTORY — DX: Gastro-esophageal reflux disease without esophagitis: K21.9

## 2015-09-20 LAB — COMPREHENSIVE METABOLIC PANEL
ALBUMIN: 3.6 g/dL (ref 3.5–5.0)
ALK PHOS: 71 U/L (ref 38–126)
ALT: 17 U/L (ref 17–63)
ANION GAP: 8 (ref 5–15)
AST: 28 U/L (ref 15–41)
BILIRUBIN TOTAL: 0.5 mg/dL (ref 0.3–1.2)
BUN: 5 mg/dL — AB (ref 6–20)
CALCIUM: 8.9 mg/dL (ref 8.9–10.3)
CO2: 24 mmol/L (ref 22–32)
Chloride: 107 mmol/L (ref 101–111)
Creatinine, Ser: 0.74 mg/dL (ref 0.61–1.24)
GFR calc Af Amer: >60 mL/min (ref >60)
GLUCOSE: 122 mg/dL — AB (ref 65–99)
Potassium: 3.2 mmol/L — ABNORMAL LOW (ref 3.5–5.1)
Sodium: 139 mmol/L (ref 135–145)
TOTAL PROTEIN: 7.2 g/dL (ref 6.5–8.1)

## 2015-09-20 LAB — CBC
HEMATOCRIT: 38.9 % — AB (ref 40.0–52.0)
Hemoglobin: 13.1 g/dL (ref 13.0–18.0)
MCH: 31.9 pg (ref 26.0–34.0)
MCHC: 33.8 g/dL (ref 32.0–36.0)
MCV: 94.3 fL (ref 80.0–100.0)
Platelets: 292 10*3/uL (ref 150–440)
RBC: 4.12 MIL/uL — ABNORMAL LOW (ref 4.40–5.90)
RDW: 15.8 % — AB (ref 11.5–14.5)
WBC: 7 10*3/uL (ref 3.8–10.6)

## 2015-09-20 MED ORDER — AMMONIA AROMATIC IN INHA
RESPIRATORY_TRACT | Status: AC
  Filled 2015-09-20: qty 10

## 2015-09-20 MED ORDER — LORAZEPAM 2 MG/ML IJ SOLN
1.0000 mg | Freq: Once | INTRAMUSCULAR | Status: AC
  Administered 2015-09-20: 1 mg via INTRAVENOUS

## 2015-09-20 MED ORDER — LORAZEPAM 2 MG/ML IJ SOLN
INTRAMUSCULAR | Status: AC
  Administered 2015-09-20: 1 mg via INTRAVENOUS
  Filled 2015-09-20: qty 1

## 2015-09-20 MED ORDER — SODIUM CHLORIDE 0.9 % IV BOLUS (SEPSIS)
1000.0000 mL | Freq: Once | INTRAVENOUS | Status: AC
Start: 2015-09-20 — End: 2015-09-21
  Administered 2015-09-20: 1000 mL via INTRAVENOUS

## 2015-09-20 NOTE — ED Notes (Signed)
Pt arrived via EMS from home. Pt is going through [facility] for [facility] the last month. Mother reports that she left the house at 1800 today and the pt was normal, but when she arrived home pt was unresponsive. When EMS arrived on scene they gave 1 mg of Narcan without any results, but pt had positive response to ammonia capsule and will respond to painful stimuli. Pt has hx of seizures.

## 2015-09-21 ENCOUNTER — Emergency Department: Payer: MEDICAID

## 2015-09-21 ENCOUNTER — Encounter: Payer: Self-pay | Admitting: Internal Medicine

## 2015-09-21 DIAGNOSIS — R4189 Other symptoms and signs involving cognitive functions and awareness: Secondary | ICD-10-CM | POA: Diagnosis present

## 2015-09-21 DIAGNOSIS — R404 Transient alteration of awareness: Secondary | ICD-10-CM | POA: Diagnosis present

## 2015-09-21 DIAGNOSIS — K219 Gastro-esophageal reflux disease without esophagitis: Secondary | ICD-10-CM | POA: Diagnosis present

## 2015-09-21 DIAGNOSIS — I10 Essential (primary) hypertension: Secondary | ICD-10-CM | POA: Diagnosis present

## 2015-09-21 LAB — URINE DRUG SCREEN, QUALITATIVE (ARMC ONLY)
Amphetamines, Ur Screen: NOT DETECTED
BARBITURATES, UR SCREEN: NOT DETECTED
BENZODIAZEPINE, UR SCRN: NOT DETECTED
Cannabinoid 50 Ng, Ur ~~LOC~~: POSITIVE — AB
Cocaine Metabolite,Ur ~~LOC~~: NOT DETECTED
MDMA (Ecstasy)Ur Screen: NOT DETECTED
METHADONE SCREEN, URINE: NOT DETECTED
Opiate, Ur Screen: NOT DETECTED
Phencyclidine (PCP) Ur S: NOT DETECTED
TRICYCLIC, UR SCREEN: NOT DETECTED

## 2015-09-21 LAB — CBC
HCT: 36.1 % — ABNORMAL LOW (ref 40.0–52.0)
HEMOGLOBIN: 12.4 g/dL — AB (ref 13.0–18.0)
MCH: 31.5 pg (ref 26.0–34.0)
MCHC: 34.3 g/dL (ref 32.0–36.0)
MCV: 91.9 fL (ref 80.0–100.0)
Platelets: 285 10*3/uL (ref 150–440)
RBC: 3.93 MIL/uL — AB (ref 4.40–5.90)
RDW: 16.2 % — ABNORMAL HIGH (ref 11.5–14.5)
WBC: 7 10*3/uL (ref 3.8–10.6)

## 2015-09-21 LAB — BASIC METABOLIC PANEL
ANION GAP: 7 (ref 5–15)
BUN: 6 mg/dL (ref 6–20)
CHLORIDE: 109 mmol/L (ref 101–111)
CO2: 24 mmol/L (ref 22–32)
Calcium: 8.7 mg/dL — ABNORMAL LOW (ref 8.9–10.3)
Creatinine, Ser: 0.7 mg/dL (ref 0.61–1.24)
GFR calc Af Amer: >60 mL/min (ref >60)
GFR calc non Af Amer: >60 mL/min (ref >60)
Glucose, Bld: 101 mg/dL — ABNORMAL HIGH (ref 65–99)
POTASSIUM: 3.1 mmol/L — AB (ref 3.5–5.1)
SODIUM: 140 mmol/L (ref 135–145)

## 2015-09-21 LAB — MAGNESIUM: MAGNESIUM: 2.1 mg/dL (ref 1.7–2.4)

## 2015-09-21 LAB — CARBAMAZEPINE LEVEL, TOTAL

## 2015-09-21 LAB — MRSA PCR SCREENING: MRSA BY PCR: NEGATIVE

## 2015-09-21 LAB — ETHANOL: Alcohol, Ethyl (B): 126 mg/dL — ABNORMAL HIGH (ref <5)

## 2015-09-21 MED ORDER — ADULT MULTIVITAMIN W/MINERALS CH
1.0000 | ORAL_TABLET | Freq: Every day | ORAL | Status: DC
  Administered 2015-09-21: 1 via ORAL
  Filled 2015-09-21: qty 1

## 2015-09-21 MED ORDER — HYDROXYZINE PAMOATE 50 MG PO CAPS: 50.0000 mg | ORAL_CAPSULE | Freq: Three times a day (TID) | ORAL | Status: DC | PRN

## 2015-09-21 MED ORDER — LORAZEPAM 0.5 MG PO TABS: 1.0000 mg | ORAL_TABLET | Freq: Four times a day (QID) | ORAL | Status: DC | PRN

## 2015-09-21 MED ORDER — LORAZEPAM 2 MG/ML IJ SOLN
0.0000 mg | Freq: Four times a day (QID) | INTRAMUSCULAR | Status: DC
Start: 2015-09-21 — End: 2015-09-21

## 2015-09-21 MED ORDER — HYDROXYZINE HCL 25 MG PO TABS: 50.0000 mg | ORAL_TABLET | Freq: Three times a day (TID) | ORAL | Status: DC | PRN

## 2015-09-21 MED ORDER — CARBAMAZEPINE 200 MG PO TABS
200.0000 mg | ORAL_TABLET | Freq: Two times a day (BID) | ORAL | Status: DC
Start: 2015-09-21 — End: 2015-09-21
  Administered 2015-09-21: 200 mg via ORAL
  Filled 2015-09-21: qty 1

## 2015-09-21 MED ORDER — VITAMIN B-1 100 MG PO TABS
100.0000 mg | ORAL_TABLET | Freq: Every day | ORAL | Status: DC
  Administered 2015-09-21: 100 mg via ORAL
  Filled 2015-09-21: qty 1

## 2015-09-21 MED ORDER — SODIUM CHLORIDE 0.9% FLUSH
3.0000 mL | Freq: Two times a day (BID) | INTRAVENOUS | Status: DC
  Administered 2015-09-21: 3 mL via INTRAVENOUS

## 2015-09-21 MED ORDER — ACETAMINOPHEN 325 MG PO TABS
650.0000 mg | ORAL_TABLET | Freq: Four times a day (QID) | ORAL | Status: DC | PRN
Start: 2015-09-21 — End: 2015-09-21

## 2015-09-21 MED ORDER — LORAZEPAM 2 MG/ML IJ SOLN: 1.0000 mg | Freq: Once | INTRAMUSCULAR | Status: DC

## 2015-09-21 MED ORDER — SODIUM CHLORIDE 0.9 % IV SOLN
1000.0000 mg | Freq: Two times a day (BID) | INTRAVENOUS | Status: DC
  Filled 2015-09-21 (×2): qty 10

## 2015-09-21 MED ORDER — ENOXAPARIN SODIUM 40 MG/0.4ML ~~LOC~~ SOLN: 40.0000 mg | SUBCUTANEOUS | Status: DC

## 2015-09-21 MED ORDER — THIAMINE HCL 100 MG/ML IJ SOLN: 100.0000 mg | Freq: Every day | INTRAMUSCULAR | Status: DC

## 2015-09-21 MED ORDER — LORAZEPAM 2 MG/ML IJ SOLN: 0.0000 mg | Freq: Two times a day (BID) | INTRAMUSCULAR | Status: DC

## 2015-09-21 MED ORDER — SODIUM CHLORIDE 0.9 % IV SOLN
1500.0000 mg | Freq: Once | INTRAVENOUS | Status: AC
  Administered 2015-09-21: 1500 mg via INTRAVENOUS
  Filled 2015-09-21: qty 15

## 2015-09-21 MED ORDER — PANTOPRAZOLE SODIUM 40 MG PO TBEC
40.0000 mg | DELAYED_RELEASE_TABLET | Freq: Every day | ORAL | Status: DC
  Administered 2015-09-21: 40 mg via ORAL
  Filled 2015-09-21: qty 1

## 2015-09-21 MED ORDER — FOLIC ACID 1 MG PO TABS
1.0000 mg | ORAL_TABLET | Freq: Every day | ORAL | Status: DC
  Administered 2015-09-21: 1 mg via ORAL
  Filled 2015-09-21: qty 1

## 2015-09-21 MED ORDER — LORAZEPAM 2 MG/ML IJ SOLN
1.0000 mg | Freq: Four times a day (QID) | INTRAMUSCULAR | Status: DC | PRN
  Administered 2015-09-21: 1 mg via INTRAVENOUS
  Filled 2015-09-21: qty 1

## 2015-09-21 MED ORDER — LORAZEPAM 2 MG/ML IJ SOLN
1.0000 mg | Freq: Once | INTRAMUSCULAR | Status: AC
  Administered 2015-09-21: 1 mg via INTRAVENOUS

## 2015-09-21 MED ORDER — ONDANSETRON HCL 4 MG PO TABS: 4.0000 mg | ORAL_TABLET | Freq: Four times a day (QID) | ORAL | Status: DC | PRN

## 2015-09-21 MED ORDER — LEVETIRACETAM 500 MG PO TABS: 500.0000 mg | ORAL_TABLET | Freq: Two times a day (BID) | ORAL | Status: DC

## 2015-09-21 MED ORDER — LORAZEPAM 2 MG/ML IJ SOLN: 0.0000 mg | Freq: Four times a day (QID) | INTRAMUSCULAR | Status: DC

## 2015-09-21 MED ORDER — ACETAMINOPHEN 650 MG RE SUPP: 650.0000 mg | Freq: Four times a day (QID) | RECTAL | Status: DC | PRN

## 2015-09-21 MED ORDER — CETYLPYRIDINIUM CHLORIDE 0.05 % MT LIQD
7.0000 mL | Freq: Two times a day (BID) | OROMUCOSAL | Status: DC
  Administered 2015-09-21: 7 mL via OROMUCOSAL

## 2015-09-21 MED ORDER — BUSPIRONE HCL 5 MG PO TABS
10.0000 mg | ORAL_TABLET | Freq: Three times a day (TID) | ORAL | Status: DC
  Administered 2015-09-21: 10 mg via ORAL
  Filled 2015-09-21: qty 2

## 2015-09-21 MED ORDER — ONDANSETRON HCL 4 MG/2ML IJ SOLN: 4.0000 mg | Freq: Four times a day (QID) | INTRAMUSCULAR | Status: DC | PRN

## 2015-09-21 NOTE — Progress Notes (Signed)
Discharge instructions given with verbalized understanding from patient.  IV's removed.  Patient denies pain.  Patient getting dressed and waiting on ride.

## 2015-09-21 NOTE — Progress Notes (Signed)
Telephone order obtained to move patient to any med-surg floor without cardiac monitor.

## 2015-09-21 NOTE — Progress Notes (Signed)
Patient taken to visitors entrance via wheelchair by volunteer to be taken home by mother in personal vehicle.

## 2015-09-21 NOTE — Progress Notes (Signed)
Initial Nutrition Assessment   INTERVENTION:   Meals and Snacks: Cater to patient preferences on Regular diet order Medical Food Supplement Therapy: will recommend on follow if po intake inadequate   NUTRITION DIAGNOSIS:   Inadequate oral intake related to acute illness as evidenced by meal completion < 50%.   GOAL:   Patient will meet greater than or equal to 90% of their needs  MONITOR:   PO intake, Labs, Weight trends, I & O's  REASON FOR ASSESSMENT:   Malnutrition Screening Tool    ASSESSMENT:    Pt admitted after being found unresponsive episode with known h/o seizure disorder and alcohol abuse with withdrawal seizures.   Past Medical History  Diagnosis Date  . Seizures (HCC)   . Polysubstance abuse   . Pancreatitis   . Asthma   . Alcohol dependence (HCC)   . Hypertension   . GERD (gastroesophageal reflux disease)     Diet Order:  Diet regular Room service appropriate?: Yes; Fluid consistency:: Thin   Current Nutrition: Per RN Fleet Contrasachel, pt ate well this am without difficulty roughly 50% of breakfast tray.  Food/Nutrition-Related History: Per [facility] decreased appetite PTA. RD notes per MD note pt undergoing counseling to aid in quitting drinking, however blood EtOH elevated on admission.   Scheduled Medications:  . antiseptic oral rinse  7 mL Mouth Rinse BID  . busPIRone  10 mg Oral TID  . carbamazepine  200 mg Oral BID  . enoxaparin (LOVENOX) injection  40 mg Subcutaneous Q24H  . folic acid  1 mg Oral Daily  . levETIRAcetam  500 mg Oral BID  . LORazepam  0-4 mg Intravenous Q6H   Followed by  . [START ON 09/23/2015] LORazepam  0-4 mg Intravenous Q12H  . multivitamin with minerals  1 tablet Oral Daily  . pantoprazole  40 mg Oral Daily  . sodium chloride flush  3 mL Intravenous Q12H  . thiamine  100 mg Oral Daily   Or  . thiamine  100 mg Intravenous Daily    Electrolyte/Renal Profile and Glucose Profile:   Recent Labs Lab 09/20/15 2327  09/21/15 0435  NA 139 140  K 3.2* 3.1*  CL 107 109  CO2 24 24  BUN 5* 6  CREATININE 0.74 0.70  CALCIUM 8.9 8.7*  MG 2.1  --   GLUCOSE 122* 101*   Protein Profile:  Recent Labs Lab 09/20/15 2327  ALBUMIN 3.6    Gastrointestinal Profile: Last BM:  09/21/2015   Nutrition-Focused Physical Exam Findings:  Unable to complete Nutrition-Focused physical exam at this time.    Weight Change: Per [facility] weight encounters pt weight of 170lbs for the past 2 months (6% weight loss). However RD also notes a likely measured weight of 157lbs 6oz. in September 2016     Height:   Ht Readings from Last 1 Encounters:  09/20/15 6' (1.829 m)    Weight:   Wt Readings from Last 1 Encounters:  09/20/15 159 lb (72.122 kg)   Wt Readings from Last 10 Encounters:  09/20/15 159 lb (72.122 kg)  08/18/15 170 lb (77.111 kg)  07/21/15 170 lb (77.111 kg)  07/19/15 170 lb (77.111 kg)  05/22/15 175 lb (79.379 kg)  05/09/15 175 lb (79.379 kg)  03/23/15 157 lb 6.4 oz (71.396 kg)  03/20/15 170 lb (77.111 kg)  01/04/15 175 lb 0.7 oz (79.4 kg)    BMI:  Body mass index is 21.56 kg/(m^2).  Estimated Nutritional Needs:   Kcal:  2000-2400kcals (25-30kcals/kg)  Protein:  65-80g protein (0.8-1.0g/kg)  Fluid:  2025-246mL of fluid (25-68mL/kg)  EDUCATION NEEDS:   No education needs identified at this time   MODERATE Care Level  Leda Quail, RD, LDN Pager [phone] Weekend/On-Call Pager [phone]

## 2015-09-21 NOTE — Discharge Instructions (Addendum)
°  DIET:  Regular diet  DISCHARGE CONDITION:  Stable  ACTIVITY:  Activity as tolerated  OXYGEN:  Home Oxygen: No.   Oxygen Delivery: room air  DISCHARGE LOCATION:  home   If you experience worsening of your admission symptoms, develop shortness of breath, life threatening emergency, suicidal or homicidal thoughts you must seek medical attention immediately by calling 911 or calling your MD immediately  if symptoms less severe.  You Must read complete instructions/literature along with all the possible adverse reactions/side effects for all the Medicines you take and that have been prescribed to you. Take any new Medicines after you have completely understood and accpet all the possible adverse reactions/side effects.   Please note  You were cared for by a hospitalist during your hospital stay. If you have any questions about your discharge medications or the care you received while you were in the hospital after you are discharged, you can call the unit and asked to speak with the hospitalist on call if the hospitalist that took care of you is not available. Once you are discharged, your primary care physician will handle any further medical issues. Please note that NO REFILLS for any discharge medications will be authorized once you are discharged, as it is imperative that you return to your primary care physician (or establish a relationship with a primary care physician if you do not have one) for your aftercare needs so that they can reassess your need for medications and monitor your lab values.   Please be compliant with taking your medications to prevent further seizures

## 2015-09-21 NOTE — ED Notes (Signed)
Medicines and clothing sent home with mom

## 2015-09-21 NOTE — ED Provider Notes (Signed)
[facility] Emergency Department Provider Note  ____________________________________________  Time seen: 11:40 PM  I have reviewed the triage vital signs and the nursing notes.  History Limited secondary to altered mental status HISTORY  Chief Complaint Altered Mental Status      HPI AZEEM POORMAN is a 30 y.o. male presents via EMS from home. Per the patient's mother he is currently undergoing alcohol detox at [facility] for the last month. She states that she left the patient at home today at 6:00 PM upon her return she found laying on her bed unresponsive. She states that the patient appeared to have had a seizure. She states this because it is consistent with previous alcohol withdrawal seizures at the patient's had in the past. Patient presents emergency Department responding to noxious stimuli only     Past Medical History  Diagnosis Date  . Seizures (HCC)   . Polysubstance abuse   . Pancreatitis   . Asthma   . Alcohol dependence (HCC)   . Hypertension     Patient Active Problem List   Diagnosis Date Noted  . GI bleed 08/17/2015  . Hematemesis 08/17/2015  . Nausea & vomiting 08/17/2015  . 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 Seizure (HCC) 05/22/2015  . Alcohol withdrawal (HCC) 05/22/2015  . Peripheral neuropathy (HCC) 05/10/2015  . Seizure disorder (HCC) 05/10/2015  . Acute alcoholic pancreatitis 03/21/2015  . Hypokalemia 03/21/2015  . Hypomagnesemia 03/21/2015  . Alcohol abuse 01/04/2015  . Alcohol withdrawal seizure (HCC) 01/04/2015  . Anxiety 01/04/2015  . Alcoholic hepatitis 01/04/2015  . Alcohol dependence (HCC) 01/04/2015  . Elevated LFTs   . Polysubstance abuse     Past Surgical History  Procedure Laterality Date  . No past surgeries    . Esophagogastroduodenoscopy Left 08/18/2015    Procedure: ESOPHAGOGASTRODUODENOSCOPY (EGD);  Surgeon: Wallace Cullens, MD;  Location: [facility] ENDOSCOPY;  Service: Endoscopy;   Laterality: Left;    Current Outpatient Rx  Name  Route  Sig  Dispense  Refill  . busPIRone (BUSPAR) 10 MG tablet   Oral   Take 1 tablet (10 mg total) by mouth 3 (three) times daily.   90 tablet   0   . carbamazepine (TEGRETOL) 200 MG tablet   Oral   Take 1 tablet (200 mg total) by mouth 2 (two) times daily.   60 tablet   0   . folic acid (FOLVITE) 1 MG tablet   Oral   Take 1 tablet (1 mg total) by mouth daily.   30 tablet   0   . hydrALAZINE (APRESOLINE) 25 MG tablet   Oral   Take 1 tablet (25 mg total) by mouth 3 (three) times daily.   90 tablet   0   . hydrOXYzine (VISTARIL) 50 MG capsule   Oral   Take 1 capsule (50 mg total) by mouth 3 (three) times daily as needed for anxiety.   30 capsule   0   . Multiple Vitamin (MULTIVITAMIN WITH MINERALS) TABS tablet   Oral   Take 1 tablet by mouth daily.   30 tablet   0   . ondansetron (ZOFRAN) 4 MG tablet   Oral   Take 1 tablet (4 mg total) by mouth every 8 (eight) hours as needed for nausea or vomiting. Take 1-2 tabs by mouth every 8 hours as needed for nausea/vomiting   30 tablet   0   . oxyCODONE-acetaminophen (PERCOCET/ROXICET) 5-325 MG tablet   Oral   Take 1  tablet by mouth every 4 (four) hours as needed for moderate pain.   30 tablet   0   . pantoprazole (PROTONIX) 40 MG tablet   Oral   Take 1 tablet (40 mg total) by mouth daily.   60 tablet   0   . clonazePAM (KLONOPIN) 2 MG tablet   Oral   Take 1 tablet (2 mg total) by mouth 2 (two) times daily. Patient not taking: Reported on 03/21/2015   30 tablet   0     Allergies No known drug allergies  Family History  Problem Relation Age of Onset  . Diabetes Mother   . Liver disease Neg Hx   . Colon cancer Neg Hx   . Lung cancer Father     Social History Social History  Substance Use Topics  . Smoking status: Current Every Day Smoker -- 1.00 packs/day for 10 years    Types: Cigarettes  . Smokeless tobacco: Never Used  . Alcohol Use: Yes      Comment: daily    Review of Systems  Constitutional: Negative for fever. Eyes: Negative for visual changes. ENT: Negative for sore throat. Cardiovascular: Negative for chest pain. Respiratory: Negative for shortness of breath. Gastrointestinal: Negative for abdominal pain, vomiting and diarrhea. Genitourinary: Negative for dysuria. Musculoskeletal: Negative for back pain. Skin: Negative for rash. Neurological: Positive for altered mental status   10-point ROS otherwise negative.  ____________________________________________   PHYSICAL EXAM:  VITAL SIGNS: ED Triage Vitals  Enc Vitals Group     BP 09/20/15 2323 135/103 mmHg     Pulse Rate 09/20/15 2323 95     Resp 09/20/15 2323 15     Temp --      Temp src --      SpO2 09/20/15 2323 100 %     Weight 09/20/15 2323 159 lb (72.122 kg)     Height 09/20/15 2323 6' (1.829 m)     Head Cir --      Peak Flow --      Pain Score --      Pain Loc --      Pain Edu? --      Excl. in GC? --      Constitutional: Responding only to noxious stimuli Eyes: Conjunctivae are normal. PERRL. Normal extraocular movements. ENT   Head: Normocephalic and atraumatic.   Nose: No congestion/rhinnorhea.   Mouth/Throat: Mucous membranes are moist.   Neck: No stridor. Hematological/Lymphatic/Immunilogical: No cervical lymphadenopathy. Cardiovascular: Normal rate, regular rhythm. Normal and symmetric distal pulses are present in all extremities. No murmurs, rubs, or gallops. Respiratory: Normal respiratory effort without tachypnea nor retractions. Breath sounds are clear and equal bilaterally. No wheezes/rales/rhonchi. Gastrointestinal: Soft and nontender. No distention. There is no CVA tenderness. Genitourinary: deferred Musculoskeletal: Nontender with normal range of motion in all extremities. No joint effusions.  No lower extremity tenderness nor edema. Neurologic:  Patient responding to noxious stimuli moving all extremities   Skin:  Skin is warm, dry and intact. No rash noted. Psychiatric: Mood and affect are normal. Speech and behavior are normal. Patient exhibits appropriate insight and judgment.  ____________________________________________    LABS (pertinent positives/negatives)  Labs Reviewed  COMPREHENSIVE METABOLIC PANEL - Abnormal; Notable for the following:    Potassium 3.2 (*)    Glucose, Bld 122 (*)    BUN 5 (*)    All other components within normal limits  CBC - Abnormal; Notable for the following:    RBC 4.12 (*)  HCT 38.9 (*)    RDW 15.8 (*)    All other components within normal limits  ETHANOL - Abnormal; Notable for the following:    Alcohol, Ethyl (B) 126 (*)    All other components within normal limits  URINE DRUG SCREEN, QUALITATIVE ([facility] ONLY) - Abnormal; Notable for the following:    Cannabinoid 50 Ng, Ur [place] POSITIVE (*)    All other components within normal limits  MAGNESIUM  CBG MONITORING, ED      RADIOLOGY  CT Head Wo Contrast (Final result) Result time: 09/21/15 02:33:32   Final result by Rad Results In Interface (09/21/15 02:33:32)   Narrative:   CLINICAL DATA: Patient was found unresponsive. History of seizures. Patient is going through alcohol detox program.  EXAM: CT HEAD WITHOUT CONTRAST  TECHNIQUE: Contiguous axial images were obtained from the base of the skull through the vertex without intravenous contrast.  COMPARISON: 05/22/2015  FINDINGS: Ventricles and sulci appear symmetrical. No ventricular dilatation. No mass effect or midline shift. No abnormal extra-axial fluid collections. Gray-white matter junctions are distinct. Basal cisterns are not effaced. No evidence of acute intracranial hemorrhage. No depressed skull fractures. Nodular mucosal thickening versus retention cysts in the maxillary antra. Mastoid air cells are not opacified.  IMPRESSION: No acute intracranial abnormalities.   Electronically Signed By: Burman Nieves  M.D. On: 09/21/2015 02:33         Critical Care performed: CRITICAL CARE Performed by: Darci Current   Total critical care time: 30 minutes  Critical care time was exclusive of separately billable procedures and treating other patients.  Critical care was necessary to treat or prevent imminent or life-threatening deterioration.  Critical care was time spent personally by me on the following activities: development of treatment plan with patient and/or surrogate as well as nursing, discussions with consultants, evaluation of patient's response to treatment, examination of patient, obtaining history from patient or surrogate, ordering and performing treatments and interventions, ordering and review of laboratory studies, ordering and review of radiographic studies, pulse oximetry and re-evaluation of patient's condition.    ____________________________________________   INITIAL IMPRESSION / ASSESSMENT AND PLAN / ED COURSE  Pertinent labs & imaging results that were available during my care of the patient were reviewed by me and considered in my medical decision making (see chart for details).  History of physical exam concerning for possible alcohol withdrawal with concomitant alcohol withdrawal seizure. As such patient received Ativan 2 mg IV in emergency Department. CIWA protocol instituted. Patient discussed with Dr. Anne Hahn for hospital admission for further evaluation and management.  ____________________________________________   FINAL CLINICAL IMPRESSION(S) / ED DIAGNOSES  Final diagnoses:  Unresponsive episode  Alcohol withdrawal, with perceptual disturbance [facility])      Darci Current, MD 09/22/15 2341

## 2015-09-21 NOTE — H&P (Addendum)
[facility] [place] [facility]   PATIENT NAME: Johnathan Rivera    MR#:  [id number]  DATE OF BIRTH:  [birth year]  DATE OF ADMISSION:  09/20/2015  PRIMARY CARE PHYSICIAN: No PCP Per Patient   REQUESTING/REFERRING PHYSICIAN: Manson Passey, MD  CHIEF COMPLAINT:   Chief Complaint  Patient presents with  . Altered Mental Status    HISTORY OF PRESENT ILLNESS:  Rapheal Masso  is a 30 y.o. male who presents with unresponsive episode. Patient has a known history of seizure disorder, and also alcohol abuse with withdrawal seizures too. His mother found him unresponsive in her bed when she returned home tonight, position such that it seems like he may have just had a seizure. In the ED initial workup was largely negative except for elevated alcohol level and cannabinoids positive on urine tox. Patient is still in a postictal state from his seizure, and his mother states that he did lose control of urinary continence. Hospitalists were called for admission.  PAST MEDICAL HISTORY:   Past Medical History  Diagnosis Date  . Seizures (HCC)   . Polysubstance abuse   . Pancreatitis   . Asthma   . Alcohol dependence (HCC)   . Hypertension   . GERD (gastroesophageal reflux disease)     PAST SURGICAL HISTORY:   Past Surgical History  Procedure Laterality Date  . No past surgeries    . Esophagogastroduodenoscopy Left 08/18/2015    Procedure: ESOPHAGOGASTRODUODENOSCOPY (EGD);  Surgeon: Wallace Cullens, MD;  Location: [facility] ENDOSCOPY;  Service: Endoscopy;  Laterality: Left;    SOCIAL HISTORY:   Social History  Substance Use Topics  . Smoking status: Current Every Day Smoker -- 1.00 packs/day for 10 years    Types: Cigarettes  . Smokeless tobacco: Never Used  . Alcohol Use: Yes     Comment: daily    FAMILY HISTORY:   Family History  Problem Relation Age of Onset  . Diabetes Mother   . Liver disease Neg Hx   . Colon cancer Neg Hx   . Lung cancer Father      DRUG ALLERGIES:  No Known Allergies  MEDICATIONS AT HOME:   Prior to Admission medications   Medication Sig Start Date End Date Taking? Authorizing Provider  busPIRone (BUSPAR) 10 MG tablet Take 1 tablet (10 mg total) by mouth 3 (three) times daily. 05/10/15  Yes Audery Amel, MD  carbamazepine (TEGRETOL) 200 MG tablet Take 1 tablet (200 mg total) by mouth 2 (two) times daily. 07/21/15  Yes Governor Rooks, MD  folic acid (FOLVITE) 1 MG tablet Take 1 tablet (1 mg total) by mouth daily. 03/23/15  Yes Altamese Dilling, MD  hydrALAZINE (APRESOLINE) 25 MG tablet Take 1 tablet (25 mg total) by mouth 3 (three) times daily. 03/23/15  Yes Altamese Dilling, MD  hydrOXYzine (VISTARIL) 50 MG capsule Take 1 capsule (50 mg total) by mouth 3 (three) times daily as needed for anxiety. 07/21/15  Yes Governor Rooks, MD  Multiple Vitamin (MULTIVITAMIN WITH MINERALS) TABS tablet Take 1 tablet by mouth daily. 03/23/15  Yes Altamese Dilling, MD  ondansetron (ZOFRAN) 4 MG tablet Take 1 tablet (4 mg total) by mouth every 8 (eight) hours as needed for nausea or vomiting. Take 1-2 tabs by mouth every 8 hours as needed for nausea/vomiting 08/18/15  Yes Wyatt Haste, MD  oxyCODONE-acetaminophen (PERCOCET/ROXICET) 5-325 MG tablet Take 1 tablet by mouth every 4 (four) hours as needed for moderate pain. 08/18/15  Yes Wyatt Haste,  MD  pantoprazole (PROTONIX) 40 MG tablet Take 1 tablet (40 mg total) by mouth daily. 08/18/15  Yes Wyatt Hasteavid K Hower, MD  clonazePAM (KLONOPIN) 2 MG tablet Take 1 tablet (2 mg total) by mouth 2 (two) times daily. Patient not taking: Reported on 03/21/2015 01/06/15   Auburn BilberryShreyang Patel, MD    REVIEW OF SYSTEMS:  Review of Systems  Unable to perform ROS: acuity of condition     VITAL SIGNS:   Filed Vitals:   09/21/15 0015 09/21/15 0030 09/21/15 0045 09/21/15 0100  BP: 147/91 143/107 153/99 159/109  Pulse: 101 118 116 105  Resp: 17 18 18 19   Height:      Weight:      SpO2: 100% 100% 100%  100%   Wt Readings from Last 3 Encounters:  09/20/15 72.122 kg (159 lb)  08/18/15 77.111 kg (170 lb)  07/21/15 77.111 kg (170 lb)    PHYSICAL EXAMINATION:  Physical Exam  Vitals reviewed. Constitutional: He appears well-developed and well-nourished. No distress.  HENT:  Head: Normocephalic and atraumatic.  Mouth/Throat: Oropharynx is clear and moist.  Eyes: Conjunctivae and EOM are normal. Pupils are equal, round, and reactive to light. No scleral icterus.  Neck: Normal range of motion. Neck supple. No JVD present. No thyromegaly present.  Cardiovascular: Normal rate, regular rhythm and intact distal pulses.  Exam reveals no gallop and no friction rub.   No murmur heard. Respiratory: Effort normal and breath sounds normal. No respiratory distress. He has no wheezes. He has no rales.  GI: Soft. Bowel sounds are normal. He exhibits no distension. There is no tenderness.  Musculoskeletal: Normal range of motion. He exhibits no edema.  No arthritis, no gout  Lymphadenopathy:    He has no cervical adenopathy.  Neurological:  Unable to assess due to patient condition. He does follow some commands to verbal stimuli, such as opening his eyes briefly when told. However, he does not interact verbally and does not consistently follow commands.  Skin: Skin is warm and dry. No rash noted. No erythema.  Psychiatric:  Unable to assess due to patient condition    LABORATORY PANEL:   CBC  Recent Labs Lab 09/20/15 2327  WBC 7.0  HGB 13.1  HCT 38.9*  PLT 292   ------------------------------------------------------------------------------------------------------------------  Chemistries   Recent Labs Lab 09/20/15 2327  NA 139  K 3.2*  CL 107  CO2 24  GLUCOSE 122*  BUN 5*  CREATININE 0.74  CALCIUM 8.9  AST 28  ALT 17  ALKPHOS 71  BILITOT 0.5   ------------------------------------------------------------------------------------------------------------------  Cardiac  Enzymes No results for input(s): TROPONINI in the last 168 hours. ------------------------------------------------------------------------------------------------------------------  RADIOLOGY:  No results found.  EKG:   Orders placed or performed during the hospital encounter of 07/21/15  . ED EKG  . ED EKG  . EKG 12-Lead  . EKG 12-Lead    IMPRESSION AND PLAN:  Principal Problem:   Unresponsive episode - high suspicion for seizure event here. Patient has a known history of seizure disorder, and has also had seizures in the setting of alcohol withdrawal in the past. He has been in treatment to try and wean himself from alcohol use. However, tonight his mother came home and found him lying in her bed unresponsive. She states that he was positioned such that it appeared that he may have just had a seizure. See below for treatment. Active Problems:   Polysubstance abuse - cannabinoids positive on tox screen, all other substances were negative. We'll need substance  counseling when he is more awake.   Seizure disorder (HCC) - on carbamazepine, however his mother is not sure if he's been taking it appropriately lately. He also appears that he may be in withdrawal from alcohol. We'll load him with Keppra, and continue dosing twice a day.   Alcohol abuse - patient has been working with counseling to try and quit drinking. However, his alcohol level is elevated here tonight. He has a history of withdrawal seizures in the past on top of his baseline seizure disorder. It is possible that this is what happened again tonight.   Anxiety - treated from for this from treatment for seizure disorders above. Afterwards he'll need his home dose anxiolytics restarted.   HTN (hypertension) - control to a goal of less than 1 6100   All the records are reviewed and case discussed with ED provider. Management plans discussed with the patient and/or family.  DVT PROPHYLAXIS: SubQ lovenox  GI PROPHYLAXIS:  None  ADMISSION STATUS: Inpatient  CODE STATUS: Full    Code Status Orders        Start     Ordered   09/21/15 0140  Full code   Continuous     09/21/15 0141    Code Status History    Date Active Date Inactive Code Status Order ID Comments User Context   08/18/2015  1:34 AM 08/18/2015  6:06 PM Full Code [id number]  Marguarite Arbour, MD Inpatient   05/22/2015  4:00 PM 05/24/2015  1:18 PM Full Code [id number]  Shaune Pollack, MD Inpatient   03/21/2015  9:07 AM 03/23/2015  7:45 PM Full Code [id number]  Crissie Figures, MD Inpatient   01/04/2015 12:52 PM 01/06/2015  1:06 PM Full Code [id number]  Gale Journey, MD Inpatient    Full Code  TOTAL CRITICAL CARE TIME TAKING CARE OF THIS PATIENT: 45 minutes.    Rilya Longo FIELDING 09/21/2015, 1:59 AM  [facility]  Office  [phone]  CC: Primary care physician; No PCP Per Patient

## 2015-09-21 NOTE — Consult Note (Addendum)
Reason for Consult:Seizure Referring Physician: Sudini  CC: Seizure  HPI: Johnathan Rivera is an 30 y.o. male who is unable to provide any history.  Family not present.  All history obtained from the chart. Patient with a known history of seizure disorder and alcohol abuse.  From review of the chart has had seizures related to ETOH abuse and some nonepileptic events.  His mother found him unresponsive in her bed when she returned home last evening.  Based on his position she felt that he may have just had a seizure. There was urinary incontinence.  In the ED initial workup was largely negative except for elevated alcohol level and cannabinoids positive on urine tox. Patient remained postictal state in the ED and was admitted.  from his seizure, and his mother states that he did lose control of urinary continence. Hospitalists were called for admission.   Unclear medication compliance.    Past Medical History  Diagnosis Date  . Seizures (HCC)   . Polysubstance abuse   . Pancreatitis   . Asthma   . Alcohol dependence (HCC)   . Hypertension   . GERD (gastroesophageal reflux disease)     Past Surgical History  Procedure Laterality Date  . No past surgeries    . Esophagogastroduodenoscopy Left 08/18/2015    Procedure: ESOPHAGOGASTRODUODENOSCOPY (EGD);  Surgeon: Wallace Cullens, MD;  Location: [facility] ENDOSCOPY;  Service: Endoscopy;  Laterality: Left;    Family History  Problem Relation Age of Onset  . Diabetes Mother   . Liver disease Neg Hx   . Colon cancer Neg Hx   . Lung cancer Father     Social History:  reports that he has been smoking Cigarettes.  He has a 10 pack-year smoking history. He has never used smokeless tobacco. He reports that he drinks alcohol. He reports that he uses illicit drugs (Marijuana and Benzodiazepines).  No Known Allergies  Medications:  I have reviewed the patient's current medications. Prior to Admission:  Prescriptions prior to admission  Medication Sig  Dispense Refill Last Dose  . busPIRone (BUSPAR) 10 MG tablet Take 1 tablet (10 mg total) by mouth 3 (three) times daily. 90 tablet 0 09/20/2015 at Unknown time  . carbamazepine (TEGRETOL) 200 MG tablet Take 1 tablet (200 mg total) by mouth 2 (two) times daily. 60 tablet 0 09/20/2015 at Unknown time  . folic acid (FOLVITE) 1 MG tablet Take 1 tablet (1 mg total) by mouth daily. 30 tablet 0 09/20/2015 at Unknown time  . hydrALAZINE (APRESOLINE) 25 MG tablet Take 1 tablet (25 mg total) by mouth 3 (three) times daily. 90 tablet 0 09/20/2015 at Unknown time  . hydrOXYzine (VISTARIL) 50 MG capsule Take 1 capsule (50 mg total) by mouth 3 (three) times daily as needed for anxiety. 30 capsule 0 PRN at PRN  . Multiple Vitamin (MULTIVITAMIN WITH MINERALS) TABS tablet Take 1 tablet by mouth daily. 30 tablet 0 09/20/2015 at Unknown time  . ondansetron (ZOFRAN) 4 MG tablet Take 1 tablet (4 mg total) by mouth every 8 (eight) hours as needed for nausea or vomiting. Take 1-2 tabs by mouth every 8 hours as needed for nausea/vomiting 30 tablet 0 PRN at PRN  . oxyCODONE-acetaminophen (PERCOCET/ROXICET) 5-325 MG tablet Take 1 tablet by mouth every 4 (four) hours as needed for moderate pain. 30 tablet 0 PRN at PRN  . pantoprazole (PROTONIX) 40 MG tablet Take 1 tablet (40 mg total) by mouth daily. 60 tablet 0 09/20/2015 at Unknown time  .  clonazePAM (KLONOPIN) 2 MG tablet Take 1 tablet (2 mg total) by mouth 2 (two) times daily. (Patient not taking: Reported on 03/21/2015) 30 tablet 0 Not Taking   Scheduled: . ammonia      . antiseptic oral rinse  7 mL Mouth Rinse BID  . busPIRone  10 mg Oral TID  . carbamazepine  200 mg Oral BID  . enoxaparin (LOVENOX) injection  40 mg Subcutaneous Q24H  . folic acid  1 mg Oral Daily  . levETIRAcetam  1,000 mg Intravenous Q12H  . LORazepam  0-4 mg Intravenous Q6H   Followed by  . [START ON 09/23/2015] LORazepam  0-4 mg Intravenous Q12H  . multivitamin with minerals  1 tablet Oral Daily  .  pantoprazole  40 mg Oral Daily  . sodium chloride flush  3 mL Intravenous Q12H  . thiamine  100 mg Oral Daily   Or  . thiamine  100 mg Intravenous Daily    ROS: History obtained from the patient  General ROS: negative for - chills, fatigue, fever, night sweats, weight gain or weight loss Psychological ROS: negative for - behavioral disorder, hallucinations, memory difficulties, mood swings or suicidal ideation Ophthalmic ROS: negative for - blurry vision, double vision, eye pain or loss of vision ENT ROS: negative for - epistaxis, nasal discharge, oral lesions, sore throat, tinnitus or vertigo Allergy and Immunology ROS: negative for - hives or itchy/watery eyes Hematological and Lymphatic ROS: negative for - bleeding problems, bruising or swollen lymph nodes Endocrine ROS: negative for - galactorrhea, hair pattern changes, polydipsia/polyuria or temperature intolerance Respiratory ROS: negative for - cough, hemoptysis, shortness of breath or wheezing Cardiovascular ROS: negative for - chest pain, dyspnea on exertion, edema or irregular heartbeat Gastrointestinal ROS: negative for - abdominal pain, diarrhea, hematemesis, nausea/vomiting or stool incontinence Genito-Urinary ROS: negative for - dysuria, hematuria, incontinence or urinary frequency/urgency Musculoskeletal ROS: negative for - joint swelling or muscular weakness Neurological ROS: as noted in HPI Dermatological ROS: negative for rash and skin lesion changes  Physical Examination: Blood pressure 149/111, pulse 106, temperature 98.6 F (37 C), temperature source Oral, resp. rate 20, height 6' (1.829 m), weight 72.122 kg (159 lb), SpO2 100 %.  HEENT-  Normocephalic, no lesions, without obvious abnormality.  Normal external eye and conjunctiva.  Normal TM's bilaterally.  Normal auditory canals and external ears. Normal external nose, mucus membranes and septum.  Normal pharynx. Cardiovascular- S1, S2 normal, pulses palpable  throughout   Lungs- chest clear, no wheezing, rales, normal symmetric air entry Abdomen- soft, non-tender; bowel sounds normal; no masses,  no organomegaly Extremities- no edema Lymph-no adenopathy palpable Musculoskeletal-no joint tenderness, deformity or swelling Skin-warm and dry, no hyperpigmentation, vitiligo, or suspicious lesions  Neurological Examination Mental Status: Alert.  Confused.  Unable to provide reliable history.  Speech fluent without evidence of aphasia.  Able to follow 3 step commands without difficulty. Cranial Nerves: II: Discs flat bilaterally; Visual fields grossly normal, pupils equal, round, reactive to light and accommodation III,IV, VI: ptosis not present, extra-ocular motions intact bilaterally V,VII: smile symmetric, facial light touch sensation normal bilaterally VIII: hearing normal bilaterally IX,X: gag reflex present XI: bilateral shoulder shrug XII: midline tongue extension Motor: Right : Upper extremity   5/5    Left:     Upper extremity   5/5  Lower extremity   5/5     Lower extremity   5/5 Tone and bulk:normal tone throughout; no atrophy noted Sensory: Pinprick and light touch intact throughout, bilaterally Deep Tendon  Reflexes: 2+ and symmetric throughout Plantars: Right: downgoing   Left: downgoing Cerebellar: Normal finger-to-nose and normal heel-to-shin testing.  Tremor noted in both upper extremities, right greater than left.   Gait: not tested due to safety concerns   Laboratory Studies:   Basic Metabolic Panel:  Recent Labs Lab 09/20/15 2327 09/21/15 0435  NA 139 140  K 3.2* 3.1*  CL 107 109  CO2 24 24  GLUCOSE 122* 101*  BUN 5* 6  CREATININE 0.74 0.70  CALCIUM 8.9 8.7*  MG 2.1  --     Liver Function Tests:  Recent Labs Lab 09/20/15 2327  AST 28  ALT 17  ALKPHOS 71  BILITOT 0.5  PROT 7.2  ALBUMIN 3.6   No results for input(s): LIPASE, AMYLASE in the last 168 hours. No results for input(s): AMMONIA in the last  168 hours.  CBC:  Recent Labs Lab 09/20/15 2327 09/21/15 0435  WBC 7.0 7.0  HGB 13.1 12.4*  HCT 38.9* 36.1*  MCV 94.3 91.9  PLT 292 285    Cardiac Enzymes: No results for input(s): CKTOTAL, CKMB, CKMBINDEX, TROPONINI in the last 168 hours.  BNP: Invalid input(s): POCBNP  CBG: No results for input(s): GLUCAP in the last 168 hours.  Microbiology: Results for orders placed or performed during the hospital encounter of 09/20/15  MRSA PCR Screening     Status: None   Collection Time: 09/21/15  3:49 AM  Result Value Ref Range Status   MRSA by PCR NEGATIVE NEGATIVE Final    Comment:        The GeneXpert MRSA Assay (FDA approved for NASAL specimens only), is one component of a comprehensive MRSA colonization surveillance program. It is not intended to diagnose MRSA infection nor to guide or monitor treatment for MRSA infections.     Coagulation Studies: No results for input(s): LABPROT, INR in the last 72 hours.  Urinalysis: No results for input(s): COLORURINE, LABSPEC, PHURINE, GLUCOSEU, HGBUR, BILIRUBINUR, KETONESUR, PROTEINUR, UROBILINOGEN, NITRITE, LEUKOCYTESUR in the last 168 hours.  Invalid input(s): APPERANCEUR  Lipid Panel:     Component Value Date/Time   CHOL 142 03/21/2015 0145   TRIG 193* 03/21/2015 0145   HDL 49 03/21/2015 0145   CHOLHDL 2.9 03/21/2015 0145   VLDL 39 03/21/2015 0145   LDLCALC 54 03/21/2015 0145    HgbA1C: No results found for: HGBA1C  Urine Drug Screen:     Component Value Date/Time   LABOPIA NONE DETECTED 09/20/2015 2328   LABBENZ NONE DETECTED 09/20/2015 2328   AMPHETMU NONE DETECTED 09/20/2015 2328   THCU POSITIVE* 09/20/2015 2328   LABBARB NONE DETECTED 09/20/2015 2328    Alcohol Level:  Recent Labs Lab 09/20/15 2328  ETH 126*     Imaging: Ct Head Wo Contrast  09/21/2015  CLINICAL DATA:  Patient was found unresponsive. History of seizures. Patient is going through alcohol detox program. EXAM: CT HEAD WITHOUT  CONTRAST TECHNIQUE: Contiguous axial images were obtained from the base of the skull through the vertex without intravenous contrast. COMPARISON:  05/22/2015 FINDINGS: Ventricles and sulci appear symmetrical. No ventricular dilatation. No mass effect or midline shift. No abnormal extra-axial fluid collections. Gray-white matter junctions are distinct. Basal cisterns are not effaced. No evidence of acute intracranial hemorrhage. No depressed skull fractures. Nodular mucosal thickening versus retention cysts in the maxillary antra. Mastoid air cells are not opacified. IMPRESSION: No acute intracranial abnormalities. Electronically Signed   By: Burman NievesWilliam  Stevens M.D.   On: 09/21/2015 02:33     Assessment/Plan:  30 year old male with a history of seizures presenting after what appears to have been a breakthrough seizure.  ETOH level greater than 100 so therefore not likely an alcohol withdrawal seizure but ETOH may have decreased his seizure threshold.  Unclear compliance.  Tegretol level not available from admission.    Recommendations: 1.  Carbamazepine level STAT 2.  Would start to taper Keppra with plans for patient to remain on monotherapy  3.  Continue seizure precautions  4.  Continue Tegretol at home dose  Thana Farr, MD Neurology [phone] 09/21/2015, 11:09 AM

## 2015-09-21 NOTE — ED Notes (Signed)
Pt arrived to room from CT.

## 2015-09-21 NOTE — Care Management (Signed)
AM met with patient to discuss problems obtaining medications.  Says he can get all of meds but did not get his Tegretol.  Is participating in [facility] rehab  and meetings are every other day.  He says that the reason he was found unresponsive was because he did not take his Tegretol.  Denies issues obtaining this medication.  Provided [employer] application and he says "Oh I have done this before and never receive call back."  discussed he needed to complete the application before can be accepted for initial appointment 

## 2015-09-22 NOTE — Discharge Summary (Signed)
[facility] - [place] at [facility]   PATIENT NAME: Johnathan Rivera    MR#:  [id number]  DATE OF BIRTH:  [birth year]  DATE OF ADMISSION:  09/20/2015 ADMITTING PHYSICIAN: Oralia Manis, MD  DATE OF DISCHARGE: 09/21/2015  2:52 PM  PRIMARY CARE PHYSICIAN: No PCP Per Patient   ADMISSION DIAGNOSIS:  Unresponsive episode [R40.4] Alcohol withdrawal, with perceptual disturbance (HCC) [F10.232]  DISCHARGE DIAGNOSIS:  Principal Problem:   Unresponsive episode Active Problems:   Alcohol abuse   Anxiety   Polysubstance abuse   Seizure disorder (HCC)   HTN (hypertension)   GERD (gastroesophageal reflux disease)   SECONDARY DIAGNOSIS:   Past Medical History  Diagnosis Date  . Seizures (HCC)   . Polysubstance abuse   . Pancreatitis   . Asthma   . Alcohol dependence (HCC)   . Hypertension   . GERD (gastroesophageal reflux disease)      ADMITTING HISTORY  Johnathan Rivera is a 30 y.o. male who presents with unresponsive episode. Patient has a known history of seizure disorder, and also alcohol abuse with withdrawal seizures too. His mother found him unresponsive in her bed when she returned home tonight, position such that it seems like he may have just had a seizure. In the ED initial workup was largely negative except for elevated alcohol level and cannabinoids positive on urine tox. Patient is still in a postictal state from his seizure, and his mother states that he did lose control of urinary continence. Hospitalists were called for admission.  HOSPITAL COURSE:   Patient was admitted to the hospital in postictal state after having a seizure. Once patient was more awake further history was obtained and patient confirmed that he had missed his Tegretol for 6 days. He was started on Keppra during the hospital stay which is being stopped. Tegretol level was undetectable. He was given Tegretol dose during the hospitalization and counseled to be compliant  with his medication. Case discussed with Dr. Thad Ranger of neurology prior to discharge.  Patient also counseled to quit alcohol.  His anxiety and hypertension was stable  Discharge home to follow-up with his primary care physician or neurologist in 1-2 weeks.  CONSULTS OBTAINED:  Treatment Team:  Kym Groom, MD  DRUG ALLERGIES:  No Known Allergies  DISCHARGE MEDICATIONS:   Discharge Medication List as of 09/21/2015  1:09 PM    CONTINUE these medications which have NOT CHANGED   Details  busPIRone (BUSPAR) 10 MG tablet Take 1 tablet (10 mg total) by mouth 3 (three) times daily., Starting 05/10/2015, Until Discontinued, Print    carbamazepine (TEGRETOL) 200 MG tablet Take 1 tablet (200 mg total) by mouth 2 (two) times daily., Starting 07/21/2015, Until Discontinued, Print    folic acid (FOLVITE) 1 MG tablet Take 1 tablet (1 mg total) by mouth daily., Starting 03/23/2015, Until Discontinued, Normal    hydrALAZINE (APRESOLINE) 25 MG tablet Take 1 tablet (25 mg total) by mouth 3 (three) times daily., Starting 03/23/2015, Until Discontinued, Normal    hydrOXYzine (VISTARIL) 50 MG capsule Take 1 capsule (50 mg total) by mouth 3 (three) times daily as needed for anxiety., Starting 07/21/2015, Until Discontinued, Print    Multiple Vitamin (MULTIVITAMIN WITH MINERALS) TABS tablet Take 1 tablet by mouth daily., Starting 03/23/2015, Until Discontinued, Normal    ondansetron (ZOFRAN) 4 MG tablet Take 1 tablet (4 mg total) by mouth every 8 (eight) hours as needed for nausea or vomiting. Take 1-2 tabs by mouth every 8 hours as needed for  nausea/vomiting, Starting 08/18/2015, Until Discontinued, Print    oxyCODONE-acetaminophen (PERCOCET/ROXICET) 5-325 MG tablet Take 1 tablet by mouth every 4 (four) hours as needed for moderate pain., Starting 08/18/2015, Until Discontinued, Print    pantoprazole (PROTONIX) 40 MG tablet Take 1 tablet (40 mg total) by mouth daily., Starting 08/18/2015, Until  Discontinued, Print      STOP taking these medications     clonazePAM (KLONOPIN) 2 MG tablet         Today   VITAL SIGNS:  Blood pressure 140/92, pulse 106, temperature 98.6 F (37 C), temperature source Oral, resp. rate 14, height 6' (1.829 m), weight 72.122 kg (159 lb), SpO2 100 %.  I/O:  No intake or output data in the 24 hours ending 09/22/15 1523  PHYSICAL EXAMINATION:  Physical Exam  GENERAL:  30 y.o.-year-old patient lying in the bed with no acute distress.  LUNGS: Normal breath sounds bilaterally, no wheezing, rales,rhonchi or crepitation. No use of accessory muscles of respiration.  CARDIOVASCULAR: S1, S2 normal. No murmurs, rubs, or gallops.  ABDOMEN: Soft, non-tender, non-distended. Bowel sounds present. No organomegaly or mass.  NEUROLOGIC: Moves all 4 extremities. PSYCHIATRIC: The patient is alert and oriented x 3.  SKIN: No obvious rash, lesion, or ulcer.   DATA REVIEW:   CBC  Recent Labs Lab 09/21/15 0435  WBC 7.0  HGB 12.4*  HCT 36.1*  PLT 285    Chemistries   Recent Labs Lab 09/20/15 2327 09/21/15 0435  NA 139 140  K 3.2* 3.1*  CL 107 109  CO2 24 24  GLUCOSE 122* 101*  BUN 5* 6  CREATININE 0.74 0.70  CALCIUM 8.9 8.7*  MG 2.1  --   AST 28  --   ALT 17  --   ALKPHOS 71  --   BILITOT 0.5  --     Cardiac Enzymes No results for input(s): TROPONINI in the last 168 hours.  Microbiology Results  Results for orders placed or performed during the hospital encounter of 09/20/15  MRSA PCR Screening     Status: None   Collection Time: 09/21/15  3:49 AM  Result Value Ref Range Status   MRSA by PCR NEGATIVE NEGATIVE Final    Comment:        The GeneXpert MRSA Assay (FDA approved for NASAL specimens only), is one component of a comprehensive MRSA colonization surveillance program. It is not intended to diagnose MRSA infection nor to guide or monitor treatment for MRSA infections.     RADIOLOGY:  Ct Head Wo Contrast  09/21/2015   CLINICAL DATA:  Patient was found unresponsive. History of seizures. Patient is going through alcohol detox program. EXAM: CT HEAD WITHOUT CONTRAST TECHNIQUE: Contiguous axial images were obtained from the base of the skull through the vertex without intravenous contrast. COMPARISON:  05/22/2015 FINDINGS: Ventricles and sulci appear symmetrical. No ventricular dilatation. No mass effect or midline shift. No abnormal extra-axial fluid collections. Gray-white matter junctions are distinct. Basal cisterns are not effaced. No evidence of acute intracranial hemorrhage. No depressed skull fractures. Nodular mucosal thickening versus retention cysts in the maxillary antra. Mastoid air cells are not opacified. IMPRESSION: No acute intracranial abnormalities. Electronically Signed   By: Burman Nieves M.D.   On: 09/21/2015 02:33    Follow up with PCP in 1 week.  Management plans discussed with the patient, family and they are in agreement.  CODE STATUS:  Code Status History    Date Active Date Inactive Code Status Order ID Comments User  Context   09/21/2015  3:48 AM 09/21/2015  5:52 PM Full Code [id number]  Oralia Manisavid Willis, MD Inpatient   09/21/2015  1:41 AM 09/21/2015  3:48 AM Full Code [id number]  Darci Currentandolph N Brown, MD ED   08/18/2015  1:34 AM 08/18/2015  6:06 PM Full Code [id number]  Marguarite ArbourJeffrey D Sparks, MD Inpatient   05/22/2015  4:00 PM 05/24/2015  1:18 PM Full Code [id number]  Shaune PollackQing Chen, MD Inpatient   03/21/2015  9:07 AM 03/23/2015  7:45 PM Full Code [id number]  Crissie FiguresEdavally N Reddy, MD Inpatient   01/04/2015 12:52 PM 01/06/2015  1:06 PM Full Code [id number]  Gale Journeyatherine P Walsh, MD Inpatient      TOTAL TIME TAKING CARE OF THIS PATIENT ON DAY OF DISCHARGE: more than 30 minutes.   Milagros LollSudini, Salman Wellen R M.D on 09/22/2015 at 3:23 PM  Between 7am to 6pm - Pager - [phone]  After 6pm go to [email or web address] - password [email or web address] [facility]  [facility]  Office  [phone]  CC: Primary care physician; No PCP Per  Patient  Note: This dictation was prepared with Dragon dictation along with smaller phrase technology. Any transcriptional errors that result from this process are unintentional.

## 2015-09-25 LAB — GLUCOSE, CAPILLARY: Glucose-Capillary: 105 mg/dL — ABNORMAL HIGH (ref 65–99)

## 2015-10-30 ENCOUNTER — Emergency Department: Payer: Self-pay

## 2015-10-30 ENCOUNTER — Emergency Department
Admission: EM | Admit: 2015-10-30 | Discharge: 2015-10-31 | Disposition: A | Discharge status: Home / Self Care | Payer: Self-pay | Attending: Emergency Medicine | Admitting: Emergency Medicine

## 2015-10-30 DIAGNOSIS — R569 Unspecified convulsions: Secondary | ICD-10-CM | POA: Insufficient documentation

## 2015-10-30 DIAGNOSIS — F1290 Cannabis use, unspecified, uncomplicated: Secondary | ICD-10-CM | POA: Insufficient documentation

## 2015-10-30 DIAGNOSIS — F17210 Nicotine dependence, cigarettes, uncomplicated: Secondary | ICD-10-CM | POA: Insufficient documentation

## 2015-10-30 DIAGNOSIS — T50905A Adverse effect of unspecified drugs, medicaments and biological substances, initial encounter: Secondary | ICD-10-CM | POA: Insufficient documentation

## 2015-10-30 DIAGNOSIS — J45909 Unspecified asthma, uncomplicated: Secondary | ICD-10-CM | POA: Insufficient documentation

## 2015-10-30 DIAGNOSIS — T50901A Poisoning by unspecified drugs, medicaments and biological substances, accidental (unintentional), initial encounter: Secondary | ICD-10-CM

## 2015-10-30 DIAGNOSIS — Z79899 Other long term (current) drug therapy: Secondary | ICD-10-CM | POA: Insufficient documentation

## 2015-10-30 DIAGNOSIS — I10 Essential (primary) hypertension: Secondary | ICD-10-CM | POA: Insufficient documentation

## 2015-10-30 LAB — CBC WITH DIFFERENTIAL/PLATELET
BASOS PCT: 1 %
Basophils Absolute: 0 10*3/uL (ref 0–0.1)
Eosinophils Absolute: 0 10*3/uL (ref 0–0.7)
Eosinophils Relative: 0 %
HEMATOCRIT: 37.4 % — AB (ref 40.0–52.0)
HEMOGLOBIN: 13 g/dL (ref 13.0–18.0)
LYMPHS ABS: 2.7 10*3/uL (ref 1.0–3.6)
Lymphocytes Relative: 39 %
MCH: 32.1 pg (ref 26.0–34.0)
MCHC: 34.7 g/dL (ref 32.0–36.0)
MCV: 92.4 fL (ref 80.0–100.0)
MONOS PCT: 10 %
Monocytes Absolute: 0.7 10*3/uL (ref 0.2–1.0)
NEUTROS ABS: 3.5 10*3/uL (ref 1.4–6.5)
NEUTROS PCT: 50 %
Platelets: 300 10*3/uL (ref 150–440)
RBC: 4.05 MIL/uL — ABNORMAL LOW (ref 4.40–5.90)
RDW: 15.9 % — ABNORMAL HIGH (ref 11.5–14.5)
WBC: 7 10*3/uL (ref 3.8–10.6)

## 2015-10-30 LAB — COMPREHENSIVE METABOLIC PANEL
ALBUMIN: 4.4 g/dL (ref 3.5–5.0)
ALT: 40 U/L (ref 17–63)
ANION GAP: 17 — AB (ref 5–15)
AST: 50 U/L — ABNORMAL HIGH (ref 15–41)
Alkaline Phosphatase: 81 U/L (ref 38–126)
BUN: 8 mg/dL (ref 6–20)
CO2: 23 mmol/L (ref 22–32)
Calcium: 9.7 mg/dL (ref 8.9–10.3)
Chloride: 105 mmol/L (ref 101–111)
Creatinine, Ser: 0.87 mg/dL (ref 0.61–1.24)
GFR calc Af Amer: >60 mL/min (ref >60)
GFR calc non Af Amer: >60 mL/min (ref >60)
GLUCOSE: 116 mg/dL — AB (ref 65–99)
POTASSIUM: 3.6 mmol/L (ref 3.5–5.1)
SODIUM: 145 mmol/L (ref 135–145)
TOTAL PROTEIN: 7.9 g/dL (ref 6.5–8.1)
Total Bilirubin: 0.4 mg/dL (ref 0.3–1.2)

## 2015-10-30 LAB — MAGNESIUM: Magnesium: 2 mg/dL (ref 1.7–2.4)

## 2015-10-30 LAB — CARBAMAZEPINE LEVEL, TOTAL: Carbamazepine Lvl: 5.7 ug/mL (ref 4.0–12.0)

## 2015-10-30 LAB — SALICYLATE LEVEL: Salicylate Lvl: <4 mg/dL (ref 2.8–30.0)

## 2015-10-30 LAB — ETHANOL: Alcohol, Ethyl (B): 124 mg/dL — ABNORMAL HIGH (ref <5)

## 2015-10-30 LAB — LIPASE, BLOOD: Lipase: 13 U/L (ref 11–51)

## 2015-10-30 LAB — ACETAMINOPHEN LEVEL

## 2015-10-30 LAB — PHOSPHORUS: PHOSPHORUS: 3.5 mg/dL (ref 2.5–4.6)

## 2015-10-30 MED ORDER — LORAZEPAM 2 MG/ML IJ SOLN
1.0000 mg | Freq: Once | INTRAMUSCULAR | Status: AC
  Administered 2015-10-30: 1 mg via INTRAVENOUS
  Filled 2015-10-30: qty 1

## 2015-10-30 MED ORDER — LORAZEPAM 2 MG/ML IJ SOLN
2.0000 mg | Freq: Once | INTRAMUSCULAR | Status: AC
  Administered 2015-10-30: 2 mg via INTRAVENOUS
  Filled 2015-10-30: qty 1

## 2015-10-30 MED ORDER — SODIUM CHLORIDE 0.9 % IV BOLUS (SEPSIS)
1000.0000 mL | Freq: Once | INTRAVENOUS | Status: AC
  Administered 2015-10-30: 1000 mL via INTRAVENOUS

## 2015-10-30 NOTE — ED Notes (Signed)
Pt arrived to ED via EMS c/o seizure. Per EMS pts mother reported pt was sitting on the ground and began to have a seizure. Mother stated that pt is in a detox program but she believes he drank some wine as well as took something else, but not certain. Pt not alert or oriented, responds to painful stimuli, skin color WNL, respirations even.

## 2015-10-30 NOTE — ED Provider Notes (Signed)
[facility] Emergency Department Provider Note   ____________________________________________  Time seen: Approximately 4:32 PM  I have reviewed the triage vital signs and the nursing notes.   HISTORY  Chief Complaint Seizures  History Limited by altered mental status  HPI Johnathan Rivera is a 30 y.o. male who per mom is living at home with her. Patient is in a detox treatment outpatient. He is not drinking as much as usual. He is however very restless and having trouble sleeping. He's been taking over-the-counter sleep aid. He may not be taking his medicines as directed and mom thinks he probably had some alcohol today. He had an episode where he was sitting bent over in his in a chair and drooling and twitching all over the place intermittently. He is not acting like himself now. She is not sure exactly when this started but it's been going on for at least an hour or 2. Patient also has a history of substance abuse   Past Medical History  Diagnosis Date  . Seizures (HCC)   . Polysubstance abuse   . Pancreatitis   . Asthma   . Alcohol dependence (HCC)   . Hypertension   . GERD (gastroesophageal reflux disease)     Patient Active Problem List   Diagnosis Date Noted  . Unresponsive episode 09/21/2015  . HTN (hypertension) 09/21/2015  . GERD (gastroesophageal reflux disease) 09/21/2015  . GI bleed 08/17/2015  . Hematemesis 08/17/2015  . Nausea & vomiting 08/17/2015  . 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 Seizure (HCC) 05/22/2015  . Alcohol withdrawal (HCC) 05/22/2015  . Peripheral neuropathy (HCC) 05/10/2015  . Seizure disorder (HCC) 05/10/2015  . Acute alcoholic pancreatitis 03/21/2015  . Hypokalemia 03/21/2015  . Hypomagnesemia 03/21/2015  . Alcohol abuse 01/04/2015  . Alcohol withdrawal seizure (HCC) 01/04/2015  . Anxiety 01/04/2015  . Alcoholic hepatitis 01/04/2015  . Alcohol dependence (HCC) 01/04/2015  .  Elevated LFTs   . Polysubstance abuse     Past Surgical History  Procedure Laterality Date  . No past surgeries    . Esophagogastroduodenoscopy Left 08/18/2015    Procedure: ESOPHAGOGASTRODUODENOSCOPY (EGD);  Surgeon: Wallace Cullens, MD;  Location: [facility] END[facility]COPY;  Service: Endoscopy;  Laterality: Left;    Current Outpatient Rx  Name  Route  Sig  Dispense  Refill  . busPIRone (BUSPAR) 10 MG tablet   Oral   Take 1 tablet (10 mg total) by mouth 3 (three) times daily.   90 tablet   0   . carbamazepine (TEGRETOL) 200 MG tablet   Oral   Take 1 tablet (200 mg total) by mouth 2 (two) times daily.   60 tablet   0   . doxylamine, Sleep, (UNISOM) 25 MG tablet   Oral   Take 50 mg by mouth at bedtime as needed for sleep.          . folic acid (FOLVITE) 1 MG tablet   Oral   Take 1 tablet (1 mg total) by mouth daily.   30 tablet   0   . hydrALAZINE (APRESOLINE) 25 MG tablet   Oral   Take 1 tablet (25 mg total) by mouth 3 (three) times daily.   90 tablet   0   . hydrOXYzine (VISTARIL) 50 MG capsule   Oral   Take 1 capsule (50 mg total) by mouth 3 (three) times daily as needed for anxiety.   30 capsule   0   . Multiple Vitamin (MULTIVITAMIN WITH MINERALS)  TABS tablet   Oral   Take 1 tablet by mouth daily.   30 tablet   0   . ondansetron (ZOFRAN) 4 MG tablet   Oral   Take 4-8 mg by mouth every 8 (eight) hours as needed for nausea or vomiting.         . pantoprazole (PROTONIX) 40 MG tablet   Oral   Take 1 tablet (40 mg total) by mouth daily.   60 tablet   0   . oxyCODONE-acetaminophen (PERCOCET/ROXICET) 5-325 MG tablet   Oral   Take 1 tablet by mouth every 4 (four) hours as needed for moderate pain. Patient not taking: Reported on 10/30/2015   30 tablet   0     Allergies Review of patient's allergies indicates no known allergies.  Family History  Problem Relation Age of Onset  . Diabetes Mother   . Liver disease Neg Hx   . Colon cancer Neg Hx   . Lung cancer  Father     Social History Social History  Substance Use Topics  . Smoking status: Current Every Day Smoker -- 1.00 packs/day for 10 years    Types: Cigarettes  . Smokeless tobacco: Never Used  . Alcohol Use: Yes     Comment: daily    Review of Systems Unable to obtain  ____________________________________________   PHYSICAL EXAM:  VITAL SIGNS: ED Triage Vitals  Enc Vitals Group     BP 10/30/15 1545 149/94 mmHg     Pulse Rate 10/30/15 1545 88     Resp --      Temp 10/30/15 1545 97.6 F (36.4 C)     Temp Source 10/30/15 1545 Axillary     SpO2 10/30/15 1545 100 %     Weight 10/30/15 1545 160 lb (72.576 kg)     Height 10/30/15 1545 5\' 8"  (1.727 m)     Head Cir --      Peak Flow --      Pain Score --      Pain Loc --      Pain Edu? --      Excl. in GC? --     Constitutional: Sleepy but arousable does not wake up completely in no acute distress. Eyes: Conjunctivae are normal. PERRL. EOMI. Head: Atraumatic. Nose: No congestion/rhinnorhea. Mouth/Throat: Mucous membranes are moist.  Oropharynx non-erythematous. Neck: No stridor.  Cardiovascular: Normal rate, regular rhythm. Grossly normal heart sounds.  Good peripheral circulation. Respiratory: Normal respiratory effort.  No retractions. Lungs CTAB. Gastrointestinal: Soft patient complains of tenderness No distention. No abdominal bruits. No CVA tenderness. Musculoskeletal: No lower extremity tenderness nor edema.  No joint effusions. Neurologic:  Again sleepy but arousable patient has intermittent twitching diffusely. Patient does move all of his extremities very slowly. Skin:  Skin is warm, dry and intact. No rash noted.   ____________________________________________   LABS (all labs ordered are listed, but only abnormal results are displayed)  Labs Reviewed  ACETAMINOPHEN LEVEL - Abnormal; Notable for the following:    Acetaminophen (Tylenol), Serum <10 (*)    All other components within normal limits    COMPREHENSIVE METABOLIC PANEL - Abnormal; Notable for the following:    Glucose, Bld 116 (*)    AST 50 (*)    Anion gap 17 (*)    All other components within normal limits  ETHANOL - Abnormal; Notable for the following:    Alcohol, Ethyl (B) 124 (*)    All other components within normal limits  CBC WITH  DIFFERENTIAL/PLATELET - Abnormal; Notable for the following:    RBC 4.05 (*)    HCT 37.4 (*)    RDW 15.9 (*)    All other components within normal limits  SALICYLATE LEVEL  CARBAMAZEPINE LEVEL, TOTAL  LIPASE, BLOOD  MAGNESIUM  PHOSPHORUS  URINALYSIS COMPLETEWITH MICROSCOPIC ([facility] ONLY)  URINE DRUG SCREEN, QUALITATIVE ([facility] ONLY)   ____________________________________________  EKG  EKG read and interpreted by me shows normal sinus rhythm rate of 94 computer is reading borderline right axis this is probably correct computers also reading borderline prolonged QT interval QTC is measured by the computer is 508 ms _EKG #2 read and interpreted by me shows normal sinus rhythm rate of 94 EKG is essentially normal except for still has a little bit of borderline right axis and borderline QT prolongation QT interval has decreased from 508 ms the 482 ms patient is more awake. ___________________________________________  RADIOLOGY  Chest x-ray read by radiology as no acute disease  CT of the head read by radiology as no acute disease ____________________________________________   PROCEDURES   ____________________________________________   INITIAL IMPRESSION / ASSESSMENT AND PLAN / ED COURSE  Pertinent labs & imaging results that were available during my care of the patient were reviewed by me and considered in my medical decision making (see chart for details). Repeat EKG showed continued improvement in the QTC. Patient given another Ativan seems to be calmer again afterwards she will has been ordered to wash the patient overnight to see how he does. Psych consult for the morning  in case the patient has not improved to help address his hyperactivity  ____________________________________________   FINAL CLINICAL IMPRESSION(S) / ED DIAGNOSES  Final diagnoses:  Accidental drug overdose, initial encounter      NEW MEDICATIONS STARTED DURING THIS VISIT:  New Prescriptions   No medications on file     Note:  This document was prepared using Dragon voice recognition software and may include unintentional dictation errors.    Arnaldo NatalPaul F Kendallyn Lippold, MD 10/31/15 [phone]

## 2015-10-30 NOTE — ED Notes (Signed)
Pt hypertensive, MD made aware. 

## 2015-10-31 MED ORDER — VITAMIN B-1 100 MG PO TABS: 100.0000 mg | ORAL_TABLET | Freq: Every day | ORAL | Status: DC

## 2015-10-31 MED ORDER — LORAZEPAM 2 MG PO TABS
0.0000 mg | ORAL_TABLET | Freq: Four times a day (QID) | ORAL | Status: DC
  Administered 2015-10-31: 1 mg via ORAL
  Filled 2015-10-31: qty 1

## 2015-10-31 NOTE — ED Notes (Signed)
Lina Hitch RN ambulated the Pt to the bedside commode to have a BM. Pt's family member requested Sherilyn CooterHenry RN to step out until the Pt was done.

## 2015-10-31 NOTE — ED Provider Notes (Signed)
-----------------------------------------   7:07 AM on 10/31/2015 -----------------------------------------   Blood pressure 145/95, pulse 97, temperature 97.6 F (36.4 C), temperature source Axillary, resp. rate 18, height 5\' 8"  (1.727 m), weight 160 lb (72.576 kg), SpO2 98 %.  The patient had no acute events since last update.  Calm and cooperative at this time.  Disposition is pending per Psychiatry/Behavioral Medicine team recommendations.     Irean HongJade J Javonnie Illescas, MD 10/31/15 [phone]

## 2015-10-31 NOTE — Discharge Instructions (Signed)
If you have any thoughts of hurting herself or others return to the emergency department.  Accidental Overdose A drug overdose occurs when a chemical substance (drug or medication) is used in amounts large enough to overcome a person. This may result in severe illness or death. This is a type of poisoning. Accidental overdoses of medications or other substances come from a variety of reasons. When this happens accidentally, it is often because the person taking the substance does not know enough about what they have taken. Drugs which commonly cause overdose deaths are alcohol, psychotropic medications (medications which affect the mind), pain medications, illegal drugs (street drugs) such as cocaine and heroin, and multiple drugs taken at the same time. It may result from careless behavior (such as over-indulging at a party). Other causes of overdose may include multiple drug use, a lapse in memory, or drug use after a period of no drug use.  Sometimes overdosing occurs because a person cannot remember if they have taken their medication.  A common unintentional overdose in young children involves multi-vitamins containing iron. Iron is a part of the hemoglobin molecule in blood. It is used to transport oxygen to living cells. When taken in small amounts, iron allows the body to restock hemoglobin. In large amounts, it causes problems in the body. If this overdose is not treated, it can lead to death. Never take medicines that show signs of tampering or do not seem quite right. Never take medicines in the dark or in poor lighting. Read the label and check each dose of medicine before you take it. When adults are poisoned, it happens most often through carelessness or lack of information. Taking medicines in the dark or taking medicine prescribed for someone else to treat the same type of problem is a dangerous practice. SYMPTOMS  Symptoms of overdose depend on the medication and amount taken. They can vary  from over-activity with stimulant over-dosage, to sleepiness from depressants such as alcohol, narcotics and tranquilizers. Confusion, dizziness, nausea and vomiting may be present. If problems are severe enough coma and death may result. DIAGNOSIS  Diagnosis and management are generally straightforward if the drug is known. Otherwise it is more difficult. At times, certain symptoms and signs exhibited by the patient, or blood tests, can reveal the drug in question.  TREATMENT  In an emergency department, most patients can be treated with supportive measures. Antidotes may be available if there has been an overdose of opioids or benzodiazepines. A rapid improvement will often occur if this is the cause of overdose. At home or away from medical care:  There may be no immediate problems or warning signs in children.  Not everything works well in all cases of poisoning.  Take immediate action. Poisons may act quickly.  If you think someone has swallowed medicine or a household product, and the person is unconscious, having seizures (convulsions), or is not breathing, immediately call for an ambulance. IF a person is conscious and appears to be doing OK but has swallowed a poison:  Do not wait to see what effect the poison will have. Immediately call a poison control center (listed in the white pages of your telephone book under "Poison Control" or inside the front cover with other emergency numbers). Some poison control centers have TTY capability for the deaf. Check with your local center if you or someone in your family requires this service.  Keep the container so you can read the label on the product for ingredients.  Describe  what, when, and how much was taken and the age and condition of the person poisoned. Inform them if the person is vomiting, choking, drowsy, shows a change in color or temperature of skin, is conscious or unconscious, or is convulsing.  Do not cause vomiting unless  instructed by medical personnel. Do not induce vomiting or force liquids into a person who is convulsing, unconscious, or very drowsy. Stay calm and in control.   Activated charcoal also is sometimes used in certain types of poisoning and you may wish to add a supply to your emergency medicines. It is available without a prescription. Call a poison control center before using this medication. PREVENTION  Thousands of children die every year from unintentional poisoning. This may be from household chemicals, poisoning from carbon monoxide in a car, taking their parent's medications, or simply taking a few iron pills or vitamins with iron. Poisoning comes from unexpected sources.  Store medicines out of the sight and reach of children, preferably in a locked cabinet. Do not keep medications in a food cabinet. Always store your medicines in a secure place. Get rid of expired medications.  If you have children living with you or have them as occasional guests, you should have child-resistant caps on your medicine containers. Keep everything out of reach. Child proof your home.  If you are called to the telephone or to answer the door while you are taking a medicine, take the container with you or put the medicine out of the reach of small children.  Do not take your medication in front of children. Do not tell your child how good a medication is and how good it is for them. They may get the idea it is more of a treat.  If you are an adult and have accidentally taken an overdose, you need to consider how this happened and what can be done to prevent it from happening again. If this was from a street drug or alcohol, determine if there is a problem that needs addressing. If you are not sure a problems exists, it is easy to talk to a professional and ask them if they think you have a problem. It is better to handle this problem in this way before it happens again and has a much worse consequence.   This  information is not intended to replace advice given to you by your health care provider. Make sure you discuss any questions you have with your health care provider.   Document Released: 08/31/2004 Document Revised: 07/08/2014 Document Reviewed: 12/05/2014 Elsevier Interactive Patient Education [employer]2016 [employer]

## 2015-10-31 NOTE — ED Provider Notes (Addendum)
-----------------------------------------   8:42 AM on 10/31/2015 -----------------------------------------  Patient is not under IVC, has no SI no HI, he is accompanied by a male colleague, he is here voluntarily, he wants to go home. We obviously cannot compel the patient does state he has no indication for acute involuntary commitment and it would be against [place] just keep him here against his will. I have suggested to him that since he has stayed here this long, he might as well wait and see the psychiatrist but he sees a differently would like to go home. He has a primary care doctor he will follow-up. Return precautions and follow-up given and understood. He understands he can come back at any time. The patient is getting dressed at this time, he declines any further medical intervention from me and he will follow closely with his doctor tomorrow. He has no indication at this time that the has an active toxidrome, nor is there any indication the patient has SI or HI or any become considered psychiatric illness.  Her not to have any possible treatment for questionable seizures and he would prefer to follow up with his PCP. I did advise him not to drive or operate heavy machinery until cleared.  Jeanmarie PlantJames A Shenequa Howse, MD 10/31/15 [phone]  Jeanmarie PlantJames A Fredric Slabach, MD 10/31/15 [phone]

## 2015-10-31 NOTE — ED Notes (Signed)
Patty from poison control called to get information about the status of the Pt. She stated that the Pt is able to go home after he voids.

## 2015-11-01 ENCOUNTER — Inpatient Hospital Stay
Admission: EM | Admit: 2015-11-01 | Discharge: 2015-11-04 | DRG: 896 | Disposition: A | Discharge status: Home / Self Care | Payer: Self-pay | Attending: Internal Medicine | Admitting: Internal Medicine

## 2015-11-01 ENCOUNTER — Encounter: Payer: Self-pay | Admitting: Emergency Medicine

## 2015-11-01 DIAGNOSIS — K047 Periapical abscess without sinus: Secondary | ICD-10-CM | POA: Diagnosis present

## 2015-11-01 DIAGNOSIS — Y906 Blood alcohol level of 120-199 mg/100 ml: Secondary | ICD-10-CM | POA: Diagnosis present

## 2015-11-01 DIAGNOSIS — F1210 Cannabis abuse, uncomplicated: Secondary | ICD-10-CM | POA: Diagnosis present

## 2015-11-01 DIAGNOSIS — F17210 Nicotine dependence, cigarettes, uncomplicated: Secondary | ICD-10-CM | POA: Diagnosis present

## 2015-11-01 DIAGNOSIS — Z833 Family history of diabetes mellitus: Secondary | ICD-10-CM

## 2015-11-01 DIAGNOSIS — E876 Hypokalemia: Secondary | ICD-10-CM | POA: Diagnosis present

## 2015-11-01 DIAGNOSIS — J45909 Unspecified asthma, uncomplicated: Secondary | ICD-10-CM | POA: Diagnosis present

## 2015-11-01 DIAGNOSIS — F419 Anxiety disorder, unspecified: Secondary | ICD-10-CM | POA: Diagnosis present

## 2015-11-01 DIAGNOSIS — F10231 Alcohol dependence with withdrawal delirium: Principal | ICD-10-CM | POA: Diagnosis present

## 2015-11-01 DIAGNOSIS — J9600 Acute respiratory failure, unspecified whether with hypoxia or hypercapnia: Secondary | ICD-10-CM | POA: Diagnosis present

## 2015-11-01 DIAGNOSIS — G40909 Epilepsy, unspecified, not intractable, without status epilepticus: Secondary | ICD-10-CM

## 2015-11-01 DIAGNOSIS — K219 Gastro-esophageal reflux disease without esophagitis: Secondary | ICD-10-CM | POA: Diagnosis present

## 2015-11-01 DIAGNOSIS — I10 Essential (primary) hypertension: Secondary | ICD-10-CM | POA: Diagnosis present

## 2015-11-01 DIAGNOSIS — Z801 Family history of malignant neoplasm of trachea, bronchus and lung: Secondary | ICD-10-CM

## 2015-11-01 DIAGNOSIS — G9341 Metabolic encephalopathy: Secondary | ICD-10-CM | POA: Diagnosis present

## 2015-11-01 DIAGNOSIS — Z79899 Other long term (current) drug therapy: Secondary | ICD-10-CM

## 2015-11-01 DIAGNOSIS — F10931 Alcohol use, unspecified with withdrawal delirium: Secondary | ICD-10-CM | POA: Diagnosis present

## 2015-11-01 LAB — CARBAMAZEPINE LEVEL, TOTAL: Carbamazepine Lvl: 4.9 ug/mL (ref 4.0–12.0)

## 2015-11-01 LAB — COMPREHENSIVE METABOLIC PANEL
ALBUMIN: 4.3 g/dL (ref 3.5–5.0)
ALT: 34 U/L (ref 17–63)
ANION GAP: 12 (ref 5–15)
AST: 45 U/L — ABNORMAL HIGH (ref 15–41)
Alkaline Phosphatase: 101 U/L (ref 38–126)
BUN: 6 mg/dL (ref 6–20)
CHLORIDE: 107 mmol/L (ref 101–111)
CO2: 25 mmol/L (ref 22–32)
Calcium: 9.3 mg/dL (ref 8.9–10.3)
Creatinine, Ser: 0.81 mg/dL (ref 0.61–1.24)
GFR calc Af Amer: >60 mL/min (ref >60)
GFR calc non Af Amer: >60 mL/min (ref >60)
Glucose, Bld: 160 mg/dL — ABNORMAL HIGH (ref 65–99)
POTASSIUM: 3.2 mmol/L — AB (ref 3.5–5.1)
SODIUM: 144 mmol/L (ref 135–145)
TOTAL PROTEIN: 8 g/dL (ref 6.5–8.1)
Total Bilirubin: 0.5 mg/dL (ref 0.3–1.2)

## 2015-11-01 LAB — CBC
HEMATOCRIT: 38.6 % — AB (ref 40.0–52.0)
HEMOGLOBIN: 13.4 g/dL (ref 13.0–18.0)
MCH: 32.3 pg (ref 26.0–34.0)
MCHC: 34.8 g/dL (ref 32.0–36.0)
MCV: 92.6 fL (ref 80.0–100.0)
Platelets: 281 10*3/uL (ref 150–440)
RBC: 4.16 MIL/uL — ABNORMAL LOW (ref 4.40–5.90)
RDW: 15.7 % — ABNORMAL HIGH (ref 11.5–14.5)
WBC: 7.5 10*3/uL (ref 3.8–10.6)

## 2015-11-01 LAB — LIPASE, BLOOD: LIPASE: 13 U/L (ref 11–51)

## 2015-11-01 LAB — ETHANOL: ALCOHOL ETHYL (B): 135 mg/dL — AB (ref <5)

## 2015-11-01 NOTE — ED Notes (Signed)
Mom states patient has history of seizures and has been vomiting all day.  Patient appears to be intoxicated, moving all extremities.  NAD

## 2015-11-02 ENCOUNTER — Inpatient Hospital Stay: Payer: Self-pay

## 2015-11-02 DIAGNOSIS — F10231 Alcohol dependence with withdrawal delirium: Secondary | ICD-10-CM | POA: Diagnosis present

## 2015-11-02 DIAGNOSIS — F10931 Alcohol use, unspecified with withdrawal delirium: Secondary | ICD-10-CM | POA: Diagnosis present

## 2015-11-02 LAB — BASIC METABOLIC PANEL
Anion gap: 11 (ref 5–15)
BUN: 6 mg/dL (ref 6–20)
CHLORIDE: 107 mmol/L (ref 101–111)
CO2: 23 mmol/L (ref 22–32)
Calcium: 8.6 mg/dL — ABNORMAL LOW (ref 8.9–10.3)
Creatinine, Ser: 0.78 mg/dL (ref 0.61–1.24)
GFR calc Af Amer: >60 mL/min (ref >60)
GFR calc non Af Amer: >60 mL/min (ref >60)
Glucose, Bld: 111 mg/dL — ABNORMAL HIGH (ref 65–99)
Potassium: 3.1 mmol/L — ABNORMAL LOW (ref 3.5–5.1)
SODIUM: 141 mmol/L (ref 135–145)

## 2015-11-02 LAB — URINALYSIS COMPLETE WITH MICROSCOPIC (ARMC ONLY)
BACTERIA UA: NONE SEEN
Bilirubin Urine: NEGATIVE
Glucose, UA: NEGATIVE mg/dL
Leukocytes, UA: NEGATIVE
NITRITE: NEGATIVE
PH: 9 — AB (ref 5.0–8.0)
PROTEIN: 30 mg/dL — AB
SPECIFIC GRAVITY, URINE: 1.015 (ref 1.005–1.030)

## 2015-11-02 LAB — URINE DRUG SCREEN, QUALITATIVE (ARMC ONLY)
Amphetamines, Ur Screen: NOT DETECTED
BARBITURATES, UR SCREEN: NOT DETECTED
BENZODIAZEPINE, UR SCRN: POSITIVE — AB
CANNABINOID 50 NG, UR ~~LOC~~: POSITIVE — AB
Cocaine Metabolite,Ur ~~LOC~~: NOT DETECTED
MDMA (Ecstasy)Ur Screen: NOT DETECTED
Methadone Scn, Ur: NOT DETECTED
Opiate, Ur Screen: NOT DETECTED
PHENCYCLIDINE (PCP) UR S: NOT DETECTED
TRICYCLIC, UR SCREEN: NOT DETECTED

## 2015-11-02 LAB — CBC
HEMATOCRIT: 36.1 % — AB (ref 40.0–52.0)
HEMOGLOBIN: 12.4 g/dL — AB (ref 13.0–18.0)
MCH: 31.6 pg (ref 26.0–34.0)
MCHC: 34.4 g/dL (ref 32.0–36.0)
MCV: 91.8 fL (ref 80.0–100.0)
Platelets: 263 10*3/uL (ref 150–440)
RBC: 3.93 MIL/uL — AB (ref 4.40–5.90)
RDW: 15.8 % — ABNORMAL HIGH (ref 11.5–14.5)
WBC: 6.3 10*3/uL (ref 3.8–10.6)

## 2015-11-02 LAB — GLUCOSE, CAPILLARY
GLUCOSE-CAPILLARY: 124 mg/dL — AB (ref 65–99)
GLUCOSE-CAPILLARY: 127 mg/dL — AB (ref 65–99)

## 2015-11-02 LAB — MAGNESIUM: Magnesium: 2 mg/dL (ref 1.7–2.4)

## 2015-11-02 LAB — POTASSIUM: Potassium: 3.1 mmol/L — ABNORMAL LOW (ref 3.5–5.1)

## 2015-11-02 LAB — MRSA PCR SCREENING: MRSA by PCR: NEGATIVE

## 2015-11-02 MED ORDER — SODIUM CHLORIDE 0.9 % IV SOLN
1500.0000 mg | Freq: Once | INTRAVENOUS | Status: AC
  Administered 2015-11-02: 1500 mg via INTRAVENOUS
  Filled 2015-11-02: qty 15

## 2015-11-02 MED ORDER — ONDANSETRON HCL 4 MG/2ML IJ SOLN
4.0000 mg | Freq: Four times a day (QID) | INTRAMUSCULAR | Status: DC | PRN
  Administered 2015-11-02: 4 mg via INTRAVENOUS

## 2015-11-02 MED ORDER — ONDANSETRON HCL 4 MG/2ML IJ SOLN
INTRAMUSCULAR | Status: AC
  Administered 2015-11-02: 4 mg via INTRAVENOUS
  Filled 2015-11-02: qty 2

## 2015-11-02 MED ORDER — POTASSIUM CHLORIDE 10 MEQ/100ML IV SOLN
10.0000 meq | INTRAVENOUS | Status: AC
Start: 2015-11-02 — End: 2015-11-02
  Administered 2015-11-02 (×4): 10 meq via INTRAVENOUS
  Filled 2015-11-02 (×4): qty 100

## 2015-11-02 MED ORDER — LORAZEPAM 2 MG/ML IJ SOLN: 2.0000 mg | Freq: Once | INTRAMUSCULAR | Status: DC

## 2015-11-02 MED ORDER — LORAZEPAM 2 MG/ML IJ SOLN
INTRAMUSCULAR | Status: AC
  Administered 2015-11-02: 1 mg via INTRAVENOUS
  Filled 2015-11-02: qty 1

## 2015-11-02 MED ORDER — LORAZEPAM 2 MG/ML IJ SOLN
1.0000 mg | Freq: Once | INTRAMUSCULAR | Status: AC
  Administered 2015-11-02: 1 mg via INTRAVENOUS

## 2015-11-02 MED ORDER — POTASSIUM CHLORIDE 10 MEQ/100ML IV SOLN
10.0000 meq | INTRAVENOUS | Status: AC
  Administered 2015-11-02 (×4): 10 meq via INTRAVENOUS
  Filled 2015-11-02 (×4): qty 100

## 2015-11-02 MED ORDER — CLONIDINE HCL 0.3 MG/24HR TD PTWK
0.3000 mg | MEDICATED_PATCH | TRANSDERMAL | Status: DC
  Administered 2015-11-02: 0.3 mg via TRANSDERMAL
  Filled 2015-11-02: qty 1

## 2015-11-02 MED ORDER — LORAZEPAM 2 MG/ML IJ SOLN
INTRAMUSCULAR | Status: AC
  Administered 2015-11-02: 2 mg via INTRAVENOUS
  Filled 2015-11-02: qty 1

## 2015-11-02 MED ORDER — THIAMINE HCL 100 MG/ML IJ SOLN
100.0000 mg | INTRAMUSCULAR | Status: DC
  Administered 2015-11-02: 100 mg via INTRAVENOUS
  Filled 2015-11-02: qty 2

## 2015-11-02 MED ORDER — THIAMINE HCL 100 MG/ML IJ SOLN
Freq: Once | INTRAVENOUS | Status: AC
  Administered 2015-11-03: 10:00:00 via INTRAVENOUS
  Filled 2015-11-02: qty 1000

## 2015-11-02 MED ORDER — LORAZEPAM 2 MG/ML IJ SOLN
INTRAMUSCULAR | Status: AC
  Filled 2015-11-02: qty 1

## 2015-11-02 MED ORDER — LORAZEPAM 2 MG PO TABS: 0.0000 mg | ORAL_TABLET | Freq: Four times a day (QID) | ORAL | Status: DC

## 2015-11-02 MED ORDER — LORAZEPAM 2 MG PO TABS: 0.0000 mg | ORAL_TABLET | Freq: Two times a day (BID) | ORAL | Status: DC

## 2015-11-02 MED ORDER — THIAMINE HCL 100 MG/ML IJ SOLN: 100.0000 mg | Freq: Every day | INTRAMUSCULAR | Status: DC

## 2015-11-02 MED ORDER — VITAMIN B-1 100 MG PO TABS: 100.0000 mg | ORAL_TABLET | Freq: Every day | ORAL | Status: DC

## 2015-11-02 MED ORDER — DEXMEDETOMIDINE HCL IN NACL 400 MCG/100ML IV SOLN: 0.2000 ug/kg/h | INTRAVENOUS | Status: DC

## 2015-11-02 MED ORDER — ENOXAPARIN SODIUM 40 MG/0.4ML ~~LOC~~ SOLN
40.0000 mg | SUBCUTANEOUS | Status: DC
  Administered 2015-11-02 – 2015-11-03 (×2): 40 mg via SUBCUTANEOUS
  Filled 2015-11-02 (×2): qty 0.4

## 2015-11-02 MED ORDER — CARBAMAZEPINE 200 MG PO TABS
200.0000 mg | ORAL_TABLET | Freq: Two times a day (BID) | ORAL | Status: DC
  Administered 2015-11-02 – 2015-11-03 (×2): 200 mg via ORAL
  Filled 2015-11-02 (×4): qty 1

## 2015-11-02 MED ORDER — LORAZEPAM 2 MG/ML IJ SOLN: 0.0000 mg | Freq: Two times a day (BID) | INTRAMUSCULAR | Status: DC

## 2015-11-02 MED ORDER — HYDRALAZINE HCL 20 MG/ML IJ SOLN
20.0000 mg | INTRAMUSCULAR | Status: DC | PRN
  Administered 2015-11-02 (×3): 20 mg via INTRAVENOUS
  Filled 2015-11-02 (×3): qty 1

## 2015-11-02 MED ORDER — LABETALOL HCL 5 MG/ML IV SOLN
10.0000 mg | INTRAVENOUS | Status: DC | PRN
  Administered 2015-11-02 (×3): 10 mg via INTRAVENOUS
  Filled 2015-11-02 (×4): qty 4

## 2015-11-02 MED ORDER — THIAMINE HCL 100 MG/ML IJ SOLN
Freq: Once | INTRAVENOUS | Status: AC
  Administered 2015-11-02: 01:00:00 via INTRAVENOUS
  Filled 2015-11-02: qty 1000

## 2015-11-02 MED ORDER — KCL IN DEXTROSE-NACL 20-5-0.9 MEQ/L-%-% IV SOLN
INTRAVENOUS | Status: DC
  Administered 2015-11-02: 125 mL/h via INTRAVENOUS
  Administered 2015-11-03 – 2015-11-04 (×3): via INTRAVENOUS
  Filled 2015-11-02 (×8): qty 1000

## 2015-11-02 MED ORDER — SODIUM CHLORIDE 0.9 % IV SOLN
500.0000 mg | Freq: Two times a day (BID) | INTRAVENOUS | Status: DC
  Administered 2015-11-02 – 2015-11-03 (×2): 500 mg via INTRAVENOUS
  Filled 2015-11-02 (×4): qty 5

## 2015-11-02 MED ORDER — SODIUM CHLORIDE 0.9% FLUSH
3.0000 mL | Freq: Two times a day (BID) | INTRAVENOUS | Status: DC
  Administered 2015-11-02 – 2015-11-03 (×4): 3 mL via INTRAVENOUS

## 2015-11-02 MED ORDER — ACETAMINOPHEN 650 MG RE SUPP: 650.0000 mg | Freq: Four times a day (QID) | RECTAL | Status: DC | PRN

## 2015-11-02 MED ORDER — ACETAMINOPHEN 325 MG PO TABS
650.0000 mg | ORAL_TABLET | Freq: Four times a day (QID) | ORAL | Status: DC | PRN
  Administered 2015-11-02 – 2015-11-03 (×3): 650 mg via ORAL
  Filled 2015-11-02 (×3): qty 2

## 2015-11-02 MED ORDER — PNEUMOCOCCAL VAC POLYVALENT 25 MCG/0.5ML IJ INJ: 0.5000 mL | INJECTION | INTRAMUSCULAR | Status: DC

## 2015-11-02 MED ORDER — NICOTINE 14 MG/24HR TD PT24
14.0000 mg | MEDICATED_PATCH | Freq: Every day | TRANSDERMAL | Status: DC
  Administered 2015-11-02 – 2015-11-03 (×2): 14 mg via TRANSDERMAL
  Filled 2015-11-02 (×2): qty 1

## 2015-11-02 MED ORDER — SODIUM CHLORIDE 0.9 % IV SOLN
INTRAVENOUS | Status: DC
  Administered 2015-11-02: 100 mL/h via INTRAVENOUS

## 2015-11-02 MED ORDER — ONDANSETRON HCL 4 MG PO TABS: 4.0000 mg | ORAL_TABLET | Freq: Four times a day (QID) | ORAL | Status: DC | PRN

## 2015-11-02 MED ORDER — ONDANSETRON HCL 4 MG/2ML IJ SOLN
4.0000 mg | Freq: Once | INTRAMUSCULAR | Status: AC
  Administered 2015-11-02: 4 mg via INTRAVENOUS

## 2015-11-02 MED ORDER — LORAZEPAM 2 MG/ML IJ SOLN
1.0000 mg | INTRAMUSCULAR | Status: DC | PRN
  Administered 2015-11-02 – 2015-11-03 (×8): 2 mg via INTRAVENOUS
  Filled 2015-11-02 (×6): qty 1

## 2015-11-02 MED ORDER — CETYLPYRIDINIUM CHLORIDE 0.05 % MT LIQD
7.0000 mL | Freq: Two times a day (BID) | OROMUCOSAL | Status: DC
  Administered 2015-11-02 – 2015-11-03 (×4): 7 mL via OROMUCOSAL

## 2015-11-02 MED ORDER — LORAZEPAM 2 MG/ML IJ SOLN: 0.0000 mg | Freq: Four times a day (QID) | INTRAMUSCULAR | Status: DC

## 2015-11-02 MED ORDER — LORAZEPAM 2 MG/ML IJ SOLN
2.0000 mg | Freq: Once | INTRAMUSCULAR | Status: AC
  Administered 2015-11-02: 2 mg via INTRAVENOUS

## 2015-11-02 NOTE — Progress Notes (Signed)
[facility] [place] at [facility]   PATIENT NAME: Johnathan Rivera    MR#:  [id number]  DATE OF BIRTH:  06/10/1986  SUBJECTIVE:  CHIEF COMPLAINT:   Chief Complaint  Patient presents with  . Emesis  Patient is a 30 year old African-American male with past medical history significant for history of alcohol abuse who has been in and out from a rehabilitation program who presents to the hospital with altered mental status, tachycardia, elevated blood pressure, possible seizure episode yesterday. On arrival to the hospital patient was felt to have delirium tremens and admitted to the hospital. Patient apparently was given Ativan yesterday and remains very somnolent, however, able to open his eyes and briefly converses but mostly mumbled by himself, not able to review systems.   Review of Systems  Unable to perform ROS: mental acuity    VITAL SIGNS: Blood pressure 164/111, pulse 107, temperature 98.6 F (37 C), temperature source Oral, resp. rate 19, height  (1.803 m), weight 81.647 kg (180 lb), SpO2 100 %.  PHYSICAL EXAMINATION:   GENERAL:  30 y.o.-year-old patient lying in the bed with no acute distress. somnolent, however, able to open his eyes, mumbles by himself, not following commands, not verbal, moves around in the bed  EYES: Pupils equal, round, reactive to light and accommodation. No scleral icterus. Extraocular muscles intact.  HEENT: Head atraumatic, normocephalic. Oropharynx and nasopharynx clear.  NECK:  Supple, no jugular venous distention. No thyroid enlargement, no tenderness.  LUNGS: Normal breath sounds bilaterally, no wheezing, rales,rhonchi or crepitation. No use of accessory muscles of respiration.  CARDIOVASCULAR: S1, S2 , Tachycardic. No murmurs, rubs, or gallops.  ABDOMEN: Soft, nontender, nondistended. Bowel sounds present. No organomegaly or mass.  EXTREMITIES: No pedal edema, cyanosis, or clubbing.  NEUROLOGIC: Cranial nerves II  through XII are grossly  intact. Muscle strength 5/5 in  low. Her extremities, minimal movements in upper extremities, not following commands, not squeezing fingers. Sensation not able to assess . Gait not checked. Slurring speech PSYCHIATRIC: The patiesomnolent, not able to assess orientation. Intermittently restless, moving in the bed with no significant tremors were noted.  SKIN: No obvious rash, lesion, or ulcer.   ORDERS/RESULTS REVIEWED:   CBC  Recent Labs Lab 10/30/15 1647 11/01/15 2301 11/02/15 0418  WBC 7.0 7.5 6.3  HGB 13.0 13.4 12.4*  HCT 37.4* 38.6* 36.1*  PLT 300 281 263  MCV 92.4 92.6 91.8  MCH 32.1 32.3 31.6  MCHC 34.7 34.8 34.4  RDW 15.9* 15.7* 15.8*  LYMPHSABS 2.7  --   --   MONOABS 0.7  --   --   EOSABS 0.0  --   --   BASOSABS 0.0  --   --    ------------------------------------------------------------------------------------------------------------------  Chemistries   Recent Labs Lab 10/30/15 1647 11/01/15 2301 11/02/15 0418  NA 145 144 141  K 3.6 3.2* 3.1*  CL 105 107 107  CO2 GLUCOSE 116* 160* 111*  BUN CREATININE 0.87 0.81 0.78  CALCIUM 9.7 9.3 8.6*  MG 2.0  --   --   AST 50* 45*  --   ALT 40 34  --   ALKPHOS 81 101  --   BILITOT 0.4 0.5  --    ------------------------------------------------------------------------------------------------------------------ estimated creatinine clearance is 141.2 mL/min (by C-G formula based on Cr of 0.78). ------------------------------------------------------------------------------------------------------------------ No results for input(s): TSH, T4TOTAL, T3FREE, THYROIDAB in the last 72 hours.  Invalid input(s): FREET3  Cardiac Enzymes No results  for input(s): CKMB, TROPONINI, MYOGLOBIN in the last 168 hours.  Invalid input(s): CK ------------------------------------------------------------------------------------------------------------------ Invalid input(s):  POCBNP ---------------------------------------------------------------------------------------------------------------  RADIOLOGY: Ct Head Wo Contrast  11/02/2015  CLINICAL DATA:  Altered mental status. Presenting with delirium tremens. Previous admissions for alcohol withdrawal. EXAM: CT HEAD WITHOUT CONTRAST TECHNIQUE: Contiguous axial images were obtained from the base of the skull through the vertex without intravenous contrast. COMPARISON:  10/30/2015 FINDINGS: Ventricles and sulci appear symmetrical. No ventricular dilatation. No mass effect or midline shift. No abnormal extra-axial fluid collections. Gray-white matter junctions are distinct. Basal cisterns are not effaced. No evidence of acute intracranial hemorrhage. No depressed skull fractures. Visualized paranasal sinuses and mastoid air cells are not opacified. IMPRESSION: No acute intracranial abnormalities. Electronically Signed   By: Burman Nieves M.D.   On: 11/02/2015 03:18    EKG:  Orders placed or performed during the hospital encounter of 10/30/15  . ED EKG  . ED EKG  . EKG 12-Lead  . EKG 12-Lead  . EKG 12-Lead  . EKG 12-Lead  . EKG 12-Lead  . EKG 12-Lead  . EKG 12-Lead  . EKG 12-Lead  . ED EKG  . ED EKG  . EKG 12-Lead  . EKG 12-Lead  . EKG    ASSESSMENT AND PLAN:  Principal Problem:   Delirium tremens (HCC) Active Problems:   Anxiety   Seizure disorder (HCC)   HTN (hypertension)   GERD (gastroesophageal reflux disease) #1. Altered mental status of unclear etiology, could be related to Ativan, Questionable Keppra, and likely postictal, clinically improving, follow neurologically closely, neurology consultation is requested. CT of the head showed no acute intracranial abnormality   #2. Hypokalemia, supplement intravenously, check magnesium level , Supplement as needed #3, DTs, continue alcohol withdrawal scale, patient is scoring 12-15 today, follow closely,initiate thiamine intravenously #4. Sinus  tachycardia, likely due to DTs, supportive therapy, on labetalol as needed #5. Seizure disorder, continue Tegretol, now also on Keppra, follow clinically, supportive therapy, Ativan as needed #6. Tobacco abuse, negative replacement therapy will be initiated, will discuss this patient whenever he is more alert #7. Cannabis abuse, supportive therapy, may benefit from intensive substance abuse program  Management plans discussed with the patient, family and they are in agreement.   DRUG ALLERGIES: No Known Allergies  CODE STATUS:     Code Status Orders        Start     Ordered   11/02/15 0352  Full code   Continuous     11/02/15 0351    Code Status History    Date Active Date Inactive Code Status Order ID Comments User Context   11/02/2015 12:26 AM 11/02/2015  3:51 AM Full Code [id number]  Darci Current, MD ED   09/21/2015  3:48 AM 09/21/2015  5:52 PM Full Code [id number]  Oralia Manis, MD Inpatient   09/21/2015  1:41 AM 09/21/2015  3:48 AM Full Code [id number]  Darci Current, MD ED   08/18/2015  1:34 AM 08/18/2015  6:06 PM Full Code [id number]  Marguarite Arbour, MD Inpatient   05/22/2015  4:00 PM 05/24/2015  1:18 PM Full Code [id number]  Shaune Pollack, MD Inpatient   03/21/2015  9:07 AM 03/23/2015  7:45 PM Full Code [id number]  Crissie Figures, MD Inpatient   01/04/2015 12:52 PM 01/06/2015  1:06 PM Full Code [id number]  Gale Journey, MD Inpatient      TOTAL Critical care TIME TAKING CARE OF THIS PATIENT: 40 minutes.    Katharina Caper M.D on  11/02/2015 at 2:53 PM  Between 7am to 6pm - Pager - [phone]  After 6pm go to [email or web address] - password [email or web address] [facility]  [facility]  Office  [phone]  CC: Primary care physician; Lanier EnsignSOLES, MEREDITH KEY, MD

## 2015-11-02 NOTE — Consult Note (Signed)
[facility] Consultation      Date: 11/02/2015,   MRN# [id number] Johnathan Rivera 1986-05-10 Code Status:     Code Status Orders        Start     Ordered   11/02/15 0352  Full code   Continuous     11/02/15 0351    Code Status History    Date Active Date Inactive Code Status Order ID Comments User Context   11/02/2015 12:26 AM 11/02/2015  3:51 AM Full Code [id number]  Darci Currentandolph N Brown, MD ED   09/21/2015  3:48 AM 09/21/2015  5:52 PM Full Code [id number]  Oralia Manisavid Willis, MD Inpatient   09/21/2015  1:41 AM 09/21/2015  3:48 AM Full Code [id number]  Darci Currentandolph N Brown, MD ED   08/18/2015  1:34 AM 08/18/2015  6:06 PM Full Code [id number]  Marguarite ArbourJeffrey D Sparks, MD Inpatient   05/22/2015  4:00 PM 05/24/2015  1:18 PM Full Code [id number]  Shaune PollackQing Chen, MD Inpatient   03/21/2015  9:07 AM 03/23/2015  7:45 PM Full Code [id number]  Crissie FiguresEdavally N Reddy, MD Inpatient   01/04/2015 12:52 PM 01/06/2015  1:06 PM Full Code [id number]  Gale Journeyatherine P Walsh, MD Inpatient     Hosp day:@LENGTHOFSTAYDAYS @ Referring MD: @ATDPROV @     PCP:      AdmissionWeight: 180 lb (81.647 kg)                 CurrentWeight: 180 lb (81.647 kg) Johnathan Rivera is a 30 y.o. old male seen in consultation for resp failure at the request of Dr. Anne HahnWillis     CHIEF COMPLAINT:   Acute resp failure   HISTORY OF PRESENT ILLNESS  30 yo AAM seen today for acute resp failure, patient noted to have been found unresponsive Patient has a known history: Abuse, and has been out of some recent rehabilitation programs. He has been admitted with alcohol withdrawal and DTs before.   He presents with  lethargic and of altered mental status, tachycardic, with an elevated blood pressure. Patient placed on 100% NRB mask and transitioned to RA -there was some report/question of some possible seizure activity.. He has had a history of withdrawal seizures in the past. -alcohol level was elevated at 135. Patient more alert and awake, follows  commands intermittantly lethargic Patient able to respond to questions, no tremors at this time   PAST MEDICAL HISTORY   Past Medical History  Diagnosis Date  . Seizures (HCC)   . Polysubstance abuse   . Pancreatitis   . Asthma   . Alcohol dependence (HCC)   . Hypertension   . GERD (gastroesophageal reflux disease)      SURGICAL HISTORY   Past Surgical History  Procedure Laterality Date  . No past surgeries    . Esophagogastroduodenoscopy Left 08/18/2015    Procedure: ESOPHAGOGASTRODUODENOSCOPY (EGD);  Surgeon: Wallace CullensPaul Y Oh, MD;  [facility];  Service: Endoscopy;  Laterality: Left;     FAMILY HISTORY   Family History  Problem Relation Age of Onset  . Diabetes Mother   . Liver disease Neg Hx   . Colon cancer Neg Hx   . Lung cancer Father      SOCIAL HISTORY   Social History  Substance Use Topics  . Smoking status: Current Every Day Smoker -- 1.00 packs/day for 10 years    Types: Cigarettes  . Smokeless tobacco: Never Used  . Alcohol Use: Yes     Comment: daily     MEDICATIONS  Home Medication:  No current outpatient prescriptions on file.  Current Medication:  Current facility-administered medications:  .  0.9 %  sodium chloride infusion, , Intravenous, Continuous, Oralia Manis, MD, Last Rate: 100 mL/hr at 11/02/15 0615 .  acetaminophen (TYLENOL) tablet 650 mg, 650 mg, Oral, Q6H PRN **OR** acetaminophen (TYLENOL) suppository 650 mg, 650 mg, Rectal, Q6H PRN, Oralia Manis, MD .  antiseptic oral rinse (CPC / CETYLPYRIDINIUM CHLORIDE 0.05%) solution 7 mL, 7 mL, Mouth Rinse, BID, Oralia Manis, MD, 7 mL at 11/02/15 0915 .  carbamazepine (TEGRETOL) tablet 200 mg, 200 mg, Oral, BID, Oralia Manis, MD, 200 mg at 11/02/15 0913 .  dexmedetomidine (PRECEDEX) 400 MCG/100ML (4 mcg/mL) infusion, 0.2-0.7 mcg/kg/hr, Intravenous, Continuous, Oralia Manis, MD, Stopped at 11/02/15 [phone] .  enoxaparin (LOVENOX) injection 40 mg, 40 mg, Subcutaneous, Q24H, Oralia Manis, MD .  hydrALAZINE (APRESOLINE) injection 20 mg, 20 mg, Intravenous, Q2H PRN, Erin Fulling, MD, 20 mg at 11/02/15 1048 .  labetalol (NORMODYNE,TRANDATE) injection 10 mg, 10 mg, Intravenous, Q2H PRN, Oralia Manis, MD, 10 mg at 11/02/15 0914 .  levETIRAcetam (KEPPRA) 500 mg in sodium chloride 0.9 % 100 mL IVPB, 500 mg, Intravenous, Q12H, Oralia Manis, MD .  LORazepam (ATIVAN) injection 1-2 mg, 1-2 mg, Intravenous, Q1H PRN, Shane Crutch, MD, 2 mg at 11/02/15 1220 .  ondansetron (ZOFRAN) tablet 4 mg, 4 mg, Oral, Q6H PRN **OR** ondansetron (ZOFRAN) injection 4 mg, 4 mg, Intravenous, Q6H PRN, Oralia Manis, MD, 4 mg at 11/02/15 0248 .  [START ON 11/03/2015] pneumococcal 23 valent vaccine (PNU-IMMUNE) injection 0.5 mL, 0.5 mL, Intramuscular, Tomorrow-1000, Oralia Manis, MD .  Melene Muller ON 11/03/2015] sodium chloride 0.9 % 1,000 mL with thiamine 100 mg, folic acid 1 mg, multivitamins adult 10 mL infusion, , Intravenous, Once, Oralia Manis, MD .  sodium chloride flush (NS) 0.9 % injection 3 mL, 3 mL, Intravenous, Q12H, Oralia Manis, MD, 3 mL at 11/02/15 0915    ALLERGIES   Review of patient's allergies indicates no known allergies.     REVIEW OF SYSTEMS   Review of Systems  Constitutional: Positive for malaise/fatigue. Negative for fever, chills and weight loss.  HENT: Negative for congestion.   Eyes: Negative for blurred vision and double vision.  Respiratory: Negative for cough, hemoptysis, sputum production, shortness of breath and wheezing.   Cardiovascular: Negative for chest pain and leg swelling.  Gastrointestinal: Negative for heartburn, nausea, vomiting and abdominal pain.  Genitourinary: Negative for dysuria.  Musculoskeletal: Negative for back pain.  Skin: Negative for rash.  Neurological: Positive for dizziness and headaches.  Psychiatric/Behavioral: Negative for depression. The patient is not nervous/anxious.      VS: BP 160/103 mmHg  Pulse 113  Temp(Src) 98.6 F (37  C) (Oral)  Resp 20  Ht  (1.803 m)  Wt 180 lb (81.647 kg)  BMI 25.12 kg/m2  SpO2 100%     PHYSICAL EXAM  Physical Exam  Constitutional: He is oriented to person, place, and time. He appears well-developed and well-nourished. No distress.  HENT:  Head: Normocephalic and atraumatic.  Mouth/Throat: No oropharyngeal exudate.  Eyes: EOM are normal. Pupils are equal, round, and reactive to light. No scleral icterus.  Neck: Normal range of motion. Neck supple.  Cardiovascular: Normal rate, regular rhythm and normal heart sounds.   No murmur heard. Pulmonary/Chest: No stridor. No respiratory distress. He has no wheezes.  Abdominal: Soft. Bowel sounds are normal.  Musculoskeletal: Normal range of motion. He exhibits no edema.  Neurological: He is alert and  oriented to person, place, and time. No cranial nerve deficit.  Skin: Skin is warm. He is not diaphoretic.  Psychiatric: He has a normal mood and affect.        LABS    Recent Labs     10/30/15  1647  11/01/15  2301  11/02/15  0418  HGB  13.0  13.4  12.4*  HCT  37.4*  38.6*  36.1*  MCV  92.4  92.6  91.8  WBC  7.0  7.5  6.3  BUN  8  6  6   CREATININE  0.87  0.81  0.78  GLUCOSE  116*  160*  111*  CALCIUM  9.7  9.3  8.6*  ,          IMAGING    Ct Head Wo Contrast  11/02/2015  CLINICAL DATA:  Altered mental status. Presenting with delirium tremens. Previous admissions for alcohol withdrawal. EXAM: CT HEAD WITHOUT CONTRAST TECHNIQUE: Contiguous axial images were obtained from the base of the skull through the vertex without intravenous contrast. COMPARISON:  10/30/2015 FINDINGS: Ventricles and sulci appear symmetrical. No ventricular dilatation. No mass effect or midline shift. No abnormal extra-axial fluid collections. Gray-white matter junctions are distinct. Basal cisterns are not effaced. No evidence of acute intracranial hemorrhage. No depressed skull fractures. Visualized paranasal sinuses and mastoid air cells  are not opacified. IMPRESSION: No acute intracranial abnormalities. Electronically Signed   By: Burman Nieves M.D.   On: 11/02/2015 03:18   Ct Head Wo Contrast  10/30/2015  CLINICAL DATA:  Seizure, alcohol detox program EXAM: CT HEAD WITHOUT CONTRAST TECHNIQUE: Contiguous axial images were obtained from the base of the skull through the vertex without intravenous contrast. COMPARISON:  09/21/2015 FINDINGS: No mass lesion. No midline shift. No acute hemorrhage or hematoma. No extra-axial fluid collections. No evidence of acute infarction. Calvarium intact. IMPRESSION: No acute abnormalities Electronically Signed   By: Esperanza Heir M.D.   On: 10/30/2015 17:35   Dg Chest Portable 1 View  10/30/2015  CLINICAL DATA:  Altered mental status, seizure today, alcohol detox program EXAM: PORTABLE CHEST 1 VIEW COMPARISON:  None. FINDINGS: The heart size and mediastinal contours are within normal limits. Both lungs are clear. The visualized skeletal structures are unremarkable. IMPRESSION: No active disease. Electronically Signed   By: Esperanza Heir M.D.   On: 10/30/2015 17:21     ASSESSMENT/PLAN  30 yo AAM admitted for mental status changes ?seizures from acute ETOH intoxication and acute Drug abuse with marijauna   monitoring for Dt's  1.step down status-Ok to transfer to floor my my stand point 2.continue Keppra 3.CIWA protocol 4.thiamine/folic acid therapy 5.BP control  Mother updated and notified of findings  I have personally obtained a history, examined the patient, evaluated laboratory and independently reviewed imaging results, formulated the assessment and plan and placed orders.  The Patient requires high complexity decision making for assessment and support, frequent evaluation and titration of therapies, application of advanced monitoring technologies and extensive interpretation of multiple databases.   Lucie Leather, M.D.  [facility]  Medical  Director [facility] Medical Director [facility] Cardio-Pulmonary Department

## 2015-11-02 NOTE — Progress Notes (Signed)
RN made Dr. Belia HemanKasa aware in rounds that patient's blood pressure remains elevated SBP 160-170 and DBP 1 teens not reduced by PRN labetalol. MD gave order for PRN hydralazine q2H with parameters.

## 2015-11-02 NOTE — ED Provider Notes (Signed)
[facility] Emergency Department Provider Note  ____________________________________________  Time seen: 12:10 AM  I have reviewed the triage vital signs and the nursing notes.   HISTORY  Chief Complaint Emesis      HPI Johnathan Rivera is a 30 y.o. male with history of polysubstance abuse, alcohol dependence presents with altered mental status, hypertensive on my presentation to room systolic 154, heart rate 124 patient agitated and attempting to crawl out of the bed not responding to his mother at bedside. Per the patient's mother he's been attempting to decrease/stop his alcohol intake. Has been attending [facility] for detox.     Past Medical History  Diagnosis Date  . Seizures (HCC)   . Polysubstance abuse   . Pancreatitis   . Asthma   . Alcohol dependence (HCC)   . Hypertension   . GERD (gastroesophageal reflux disease)     Patient Active Problem List   Diagnosis Date Noted  . Unresponsive episode 09/21/2015  . HTN (hypertension) 09/21/2015  . GERD (gastroesophageal reflux disease) 09/21/2015  . GI bleed 08/17/2015  . Hematemesis 08/17/2015  . Nausea & vomiting 08/17/2015  . 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 Seizure (HCC) 05/22/2015  . Alcohol withdrawal (HCC) 05/22/2015  . Peripheral neuropathy (HCC) 05/10/2015  . Seizure disorder (HCC) 05/10/2015  . Acute alcoholic pancreatitis 03/21/2015  . Hypokalemia 03/21/2015  . Hypomagnesemia 03/21/2015  . Alcohol abuse 01/04/2015  . Alcohol withdrawal seizure (HCC) 01/04/2015  . Anxiety 01/04/2015  . Alcoholic hepatitis 01/04/2015  . Alcohol dependence (HCC) 01/04/2015  . Elevated LFTs   . Polysubstance abuse     Past Surgical History  Procedure Laterality Date  . No past surgeries    . Esophagogastroduodenoscopy Left 08/18/2015    Procedure: ESOPHAGOGASTRODUODENOSCOPY (EGD);  Surgeon: Wallace Cullens, MD;  Location: [facility] ENDOSCOPY;  Service: Endoscopy;  Laterality: Left;     Current Outpatient Rx  Name  Route  Sig  Dispense  Refill  . busPIRone (BUSPAR) 10 MG tablet   Oral   Take 1 tablet (10 mg total) by mouth 3 (three) times daily.   90 tablet   0   . carbamazepine (TEGRETOL) 200 MG tablet   Oral   Take 1 tablet (200 mg total) by mouth 2 (two) times daily.   60 tablet   0   . hydrALAZINE (APRESOLINE) 25 MG tablet   Oral   Take 1 tablet (25 mg total) by mouth 3 (three) times daily.   90 tablet   0   . hydrOXYzine (VISTARIL) 50 MG capsule   Oral   Take 1 capsule (50 mg total) by mouth 3 (three) times daily as needed for anxiety.   30 capsule   0   . Multiple Vitamin (MULTIVITAMIN WITH MINERALS) TABS tablet   Oral   Take 1 tablet by mouth daily.   30 tablet   0     Allergies No known drug allergies  Family History  Problem Relation Age of Onset  . Diabetes Mother   . Liver disease Neg Hx   . Colon cancer Neg Hx   . Lung cancer Father     Social History Social History  Substance Use Topics  . Smoking status: Current Every Day Smoker -- 1.00 packs/day for 10 years    Types: Cigarettes  . Smokeless tobacco: Never Used  . Alcohol Use: Yes     Comment: daily    Review of Systems  Constitutional: Negative for fever. Eyes: Negative for  visual changes. ENT: Negative for sore throat. Cardiovascular: Negative for chest pain. Respiratory: Negative for shortness of breath. Gastrointestinal: Negative for abdominal pain, vomiting and diarrhea. Genitourinary: Negative for dysuria. Musculoskeletal: Negative for back pain. Skin: Negative for rash. Neurological: Positive for altered mental status and agitation 10-point ROS otherwise negative.  ____________________________________________   PHYSICAL EXAM:  VITAL SIGNS: ED Triage Vitals  Enc Vitals Group     BP 11/01/15 2253 168/106 mmHg     Pulse Rate 11/01/15 2253 100     Resp 11/01/15 2253 16     Temp 11/01/15 2253 98.4 F (36.9 C)     Temp Source 11/01/15 2253 Oral      SpO2 11/01/15 2253 100 %     Weight 11/01/15 2253 180 lb (81.647 kg)     Height 11/01/15 2253  (1.803 m)     Head Cir --      Peak Flow --      Pain Score 11/01/15 2258 0     Pain Loc --      Pain Edu? --      Excl. in GC? --      Constitutional: Alert but combative Eyes: Conjunctivae are normal. PERRL. Normal extraocular movements. ENT   Head: Normocephalic and atraumatic.   Nose: No congestion/rhinnorhea.   Mouth/Throat: Mucous membranes are moist.   Neck: No stridor. Hematological/Lymphatic/Immunilogical: No cervical lymphadenopathy. Cardiovascular: Tachycardia. Normal and symmetric distal pulses are present in all extremities. No murmurs, rubs, or gallops. Respiratory: Normal respiratory effort without tachypnea nor retractions. Breath sounds are clear and equal bilaterally. No wheezes/rales/rhonchi. Gastrointestinal: Soft and nontender. No distention. There is no CVA tenderness. Genitourinary: deferred Musculoskeletal: Nontender with normal range of motion in all extremities. No joint effusions.  No lower extremity tenderness nor edema. Neurologic:  Normal speech and language. No gross focal neurologic deficits are appreciated. Speech is normal.  Skin:  Skin is warm, dry and intact. No rash noted. Psychiatric: Agitated, combative ____________________________________________    LABS (pertinent positives/negatives)  Labs Reviewed  COMPREHENSIVE METABOLIC PANEL - Abnormal; Notable for the following:    Potassium 3.2 (*)    Glucose, Bld 160 (*)    AST 45 (*)    All other components within normal limits  CBC - Abnormal; Notable for the following:    RBC 4.16 (*)    HCT 38.6 (*)    RDW 15.7 (*)    All other components within normal limits  ETHANOL - Abnormal; Notable for the following:    Alcohol, Ethyl (B) 135 (*)    All other components within normal limits  LIPASE, BLOOD  CARBAMAZEPINE LEVEL, TOTAL  URINALYSIS COMPLETEWITH MICROSCOPIC ([facility]  ONLY)  URINE DRUG SCREEN, QUALITATIVE ([facility] ONLY)      Critical Care performed: CRITICAL CARE Performed by: Darci Current   Total critical care time: 60 minutes  Critical care time was exclusive of separately billable procedures and treating other patients.  Critical care was necessary to treat or prevent imminent or life-threatening deterioration.  Critical care was time spent personally by me on the following activities: development of treatment plan with patient and/or surrogate as well as nursing, discussions with consultants, evaluation of patient's response to treatment, examination of patient, obtaining history from patient or surrogate, ordering and performing treatments and interventions, ordering and review of laboratory studies, ordering and review of radiographic studies, pulse oximetry and re-evaluation of patient's condition.    ____________________________________________   INITIAL IMPRESSION / ASSESSMENT AND PLAN / ED COURSE  Pertinent labs & imaging results that were available during my care of the patient were reviewed by me and considered in my medical decision making (see chart for details).  Patient tachycardic hypertensive tachypnea With altered mental status and concern for delirium tremens given recent decrease alcohol intake. Patient received 2 mg of Ativan orally after my arrival to the room with improvement of heart rate. Patient discussed with Dr. Anne HahnWillis for hospital admission for further evaluation and management.  Patient continued to have episodes emergency department with increased heart rate and rhythm with movement consistent with possible seizures as such repeated doses of Ativan given  ____________________________________________   FINAL CLINICAL IMPRESSION(S) / ED DIAGNOSES  Final diagnoses:  Delirium tremens (HCC)      Darci Currentandolph N Rylyn Zawistowski, MD 11/02/15 [phone]

## 2015-11-02 NOTE — Progress Notes (Signed)
eLink Physician-Brief Progress Note Patient Name: Carnella GuadalajaraMichael M Herrin DOB: [birth year] MRN: [id number]   Date of Service  11/02/2015  HPI/Events of Note  Pt reviewed/ camera check. Pt admitted with DT, currently responsive to pain, but otherwise lethargic, has received ativan in ED.  Now on NRB with sat 100%; VSS, RR=23, no respiratory distress, no impending resp distress noted. Has 2 peripherals.   eICU Interventions  Continue CIWA, wean down oxygen as tolerated. May consider addition of precedex.         Shane CrutchPradeep Trevar Boehringer 11/02/2015, 4:07 AM

## 2015-11-02 NOTE — ED Notes (Signed)
Pt diaphoretic, pt body appears to be jerking, pt not responding to painful stimuli. Dr. Anne HahnWillis at bedside. See MAR.

## 2015-11-02 NOTE — ED Notes (Signed)
Dr. Willis at bedside  

## 2015-11-02 NOTE — Progress Notes (Signed)
RN made MD aware that patient is asking for nicotine patch and that he is anxious and agitated and being given ativan per CIWA scale. MD gave order for nicotine patch.

## 2015-11-02 NOTE — Progress Notes (Signed)
RN spoke with Dr. Winona LegatoVaickute and made MD aware that patient's K+ recheck is 3.1, MD gave order for run of K+.

## 2015-11-02 NOTE — Progress Notes (Signed)
RN discussed with Dr. Winona LegatoVaickute that patient's blood pressure is remaining high even after PRN labetalol was given.  MD stated she would order nitro patch.

## 2015-11-02 NOTE — ED Notes (Signed)
Dr. Brown at bedside

## 2015-11-02 NOTE — ED Notes (Addendum)
Pt appears to be seizing, heart rate ranging 120s-130s. Pt placed on non-rebreather. MD notified, see MAR.

## 2015-11-02 NOTE — BH Assessment (Signed)
[facility] Assessment Progress Note   Clinician informed by Gwynneth MunsonButch at [facility] ED that patient is being admitted medically.

## 2015-11-02 NOTE — Progress Notes (Signed)
Pt lying in bed w/d from pain oxygen down to 50% VENTI MASK, Pt . cont. on CIWA. Pt received ativan as ordered. Pt BP also elevated and was given labetalol which wasn't effective. Dr. Ardyth Manam notified no new orders received. Pt remains on seizure and aspiration precautions . Foley noted to gravity.further assessment via flowsheet

## 2015-11-02 NOTE — H&P (Signed)
[facility] [place] at [facility]   PATIENT NAME: Vishal Sandlin    MR#:  [id number]  DATE OF BIRTH:  1985-12-14  DATE OF ADMISSION:  11/01/2015  PRIMARY CARE PHYSICIAN: Lanier Ensign KEY, MD   REQUESTING/REFERRING PHYSICIAN: Manson Passey, MD  CHIEF COMPLAINT:   Chief Complaint  Patient presents with  . Emesis    HISTORY OF PRESENT ILLNESS:  Maggie Dworkin  is a 30 y.o. male who presents with signs delirium tremens. Patient has a known history: Abuse, and has been out of some recent rehabilitation programs. He has been admitted with alcohol withdrawal and DTs before. He presents today lethargic and of altered mental status, tachycardic, with an elevated blood pressure. Is also some question of some possible seizure activity yesterday per his mother. He has had a history of withdrawal seizures in the past. In the 80s alcohol level was elevated at 135.  Hospitalists were called for admission  PAST MEDICAL HISTORY:   Past Medical History  Diagnosis Date  . Seizures (HCC)   . Polysubstance abuse   . Pancreatitis   . Asthma   . Alcohol dependence (HCC)   . Hypertension   . GERD (gastroesophageal reflux disease)     PAST SURGICAL HISTORY:   Past Surgical History  Procedure Laterality Date  . No past surgeries    . Esophagogastroduodenoscopy Left 08/18/2015    Procedure: ESOPHAGOGASTRODUODENOSCOPY (EGD);  Surgeon: Wallace Cullens, MD;  Location: [facility] ENDOSCOPY;  Service: Endoscopy;  Laterality: Left;    SOCIAL HISTORY:   Social History  Substance Use Topics  . Smoking status: Current Every Day Smoker -- 1.00 packs/day for 10 years    Types: Cigarettes  . Smokeless tobacco: Never Used  . Alcohol Use: Yes     Comment: daily    FAMILY HISTORY:   Family History  Problem Relation Age of Onset  . Diabetes Mother   . Liver disease Neg Hx   . Colon cancer Neg Hx   . Lung cancer Father     DRUG ALLERGIES:  No Known Allergies  MEDICATIONS AT  HOME:   Prior to Admission medications   Medication Sig Start Date End Date Taking? Authorizing Provider  busPIRone (BUSPAR) 10 MG tablet Take 1 tablet (10 mg total) by mouth 3 (three) times daily. 05/10/15  Yes Audery Amel, MD  carbamazepine (TEGRETOL) 200 MG tablet Take 1 tablet (200 mg total) by mouth 2 (two) times daily. 07/21/15  Yes Governor Rooks, MD  hydrALAZINE (APRESOLINE) 25 MG tablet Take 1 tablet (25 mg total) by mouth 3 (three) times daily. 03/23/15  Yes Altamese Dilling, MD  hydrOXYzine (VISTARIL) 50 MG capsule Take 1 capsule (50 mg total) by mouth 3 (three) times daily as needed for anxiety. 07/21/15  Yes Governor Rooks, MD  Multiple Vitamin (MULTIVITAMIN WITH MINERALS) TABS tablet Take 1 tablet by mouth daily. 03/23/15  Yes Altamese Dilling, MD    REVIEW OF SYSTEMS:  Review of Systems  Unable to perform ROS: acuity of condition     VITAL SIGNS:   Filed Vitals:   11/01/15 2253 11/02/15 0006 11/02/15 0030 11/02/15 0100  BP: 168/106 154/99 150/96 170/103  Pulse: 100 101 94 108  Temp: 98.4 F (36.9 C)     TempSrc: Oral     Resp: Height:  (1.803 m)     Weight: 81.647 kg (180 lb)     SpO2: 100% 97% 97% 93%   Wt Readings from Last  3 Encounters:  11/01/15 81.647 kg (180 lb)  10/30/15 72.576 kg (160 lb)  09/20/15 72.122 kg (159 lb)    PHYSICAL EXAMINATION:  Physical Exam  Vitals reviewed. Constitutional: He appears well-developed and well-nourished. No distress.  HENT:  Head: Normocephalic and atraumatic.  Mouth/Throat: Oropharynx is clear and moist.  Eyes: Conjunctivae and EOM are normal. Pupils are equal, round, and reactive to light. No scleral icterus.  Neck: Normal range of motion. Neck supple. No JVD present. No thyromegaly present.  Cardiovascular: Regular rhythm and intact distal pulses.  Exam reveals no gallop and no friction rub.   No murmur heard. Tachycardic  Respiratory: Effort normal and breath sounds normal. No respiratory  distress. He has no wheezes. He has no rales.  GI: Soft. Bowel sounds are normal. He exhibits no distension. There is no tenderness.  Musculoskeletal: Normal range of motion. He exhibits no edema.  No arthritis, no gout  Lymphadenopathy:    He has no cervical adenopathy.  Neurological:  Patient is not following commands. On this writer's exam he was not responding to painful stimuli. Nursing reported he had been responding to painful stimuli by localizing. On this writer's examination he seems to be having some question of some possible seizure activity.  Skin: Skin is warm and dry. No rash noted. No erythema.  Psychiatric:  Unable to assess due to the patient's condition    LABORATORY PANEL:   CBC  Recent Labs Lab 11/01/15 2301  WBC 7.5  HGB 13.4  HCT 38.6*  PLT 281   ------------------------------------------------------------------------------------------------------------------  Chemistries   Recent Labs Lab 10/30/15 1647 11/01/15 2301  NA 145 144  K 3.6 3.2*  CL 105 107  CO2 23 25  GLUCOSE 116* 160*  BUN 8 6  CREATININE 0.87 0.81  CALCIUM 9.7 9.3  MG 2.0  --   AST 50* 45*  ALT 40 34  ALKPHOS 81 101  BILITOT 0.4 0.5   ------------------------------------------------------------------------------------------------------------------  Cardiac Enzymes No results for input(s): TROPONINI in the last 168 hours. ------------------------------------------------------------------------------------------------------------------  RADIOLOGY:  No results found.  EKG:   Orders placed or performed during the hospital encounter of 10/30/15  . ED EKG  . ED EKG  . EKG 12-Lead  . EKG 12-Lead  . EKG 12-Lead  . EKG 12-Lead  . EKG 12-Lead  . EKG 12-Lead  . EKG 12-Lead  . EKG 12-Lead  . ED EKG  . ED EKG  . EKG 12-Lead  . EKG 12-Lead  . EKG    IMPRESSION AND PLAN:  Principal Problem:   Delirium tremens (HCC) - patient is to this point still respiratory stable,  however he is usually a couple of doses of Ativan and given his potential seizure activity may likely need some more. If he drops his respiratory drive he will likely require intubation. We will admit to ICU with withdrawal protocol order set. Active Problems:   Seizure disorder (HCC) - history of withdrawal seizures. We will continue home antiepileptics as well as using his ordered Ativan and adding Keppra here for his possible seizure activity.   Anxiety - will be on a significant dose of benzos or other withdrawal treatment which also concurrently treat anxiety.   HTN (hypertension) - elevated, treat as above and also with when necessary antihypertensives for goal less than 160/100.   GERD (gastroesophageal reflux disease) - home dose PPI  All the records are reviewed and case discussed with ED provider. Management plans discussed with the patient and/or family.  DVT PROPHYLAXIS:  SubQ lovenox  GI PROPHYLAXIS: None  ADMISSION STATUS: Inpatient  CODE STATUS: Full    Code Status Orders        Start     Ordered   11/02/15 0026  Full code   Continuous     11/02/15 0025    Code Status History    Date Active Date Inactive Code Status Order ID Comments User Context   09/21/2015  3:48 AM 09/21/2015  5:52 PM Full Code [id number]  Oralia Manis, MD Inpatient   09/21/2015  1:41 AM 09/21/2015  3:48 AM Full Code [id number]  Darci Current, MD ED   08/18/2015  1:34 AM 08/18/2015  6:06 PM Full Code [id number]  Marguarite Arbour, MD Inpatient   05/22/2015  4:00 PM 05/24/2015  1:18 PM Full Code [id number]  Shaune Pollack, MD Inpatient   03/21/2015  9:07 AM 03/23/2015  7:45 PM Full Code [id number]  Crissie Figures, MD Inpatient   01/04/2015 12:52 PM 01/06/2015  1:06 PM Full Code [id number]  Gale Journey, MD Inpatient    Full Code  TOTAL CRITICAL CARE TIME TAKING CARE OF THIS PATIENT: 45 minutes.    Aki Abalos FIELDING 11/02/2015, 1:14 AM  [facility]  Office  [phone]  CC: Primary  care physician; Lanier Ensign KEY, MD

## 2015-11-02 NOTE — ED Notes (Signed)
Pt not responding to painful stimuli, pt on non-rebreather to ease breathing. Dr. Manson PasseyBrown at bedside.

## 2015-11-03 DIAGNOSIS — F10231 Alcohol dependence with withdrawal delirium: Principal | ICD-10-CM

## 2015-11-03 LAB — POTASSIUM
POTASSIUM: 3 mmol/L — AB (ref 3.5–5.1)
Potassium: 3.5 mmol/L (ref 3.5–5.1)

## 2015-11-03 LAB — PHOSPHORUS: PHOSPHORUS: 3.3 mg/dL (ref 2.5–4.6)

## 2015-11-03 LAB — HEMOGLOBIN: HEMOGLOBIN: 12 g/dL — AB (ref 13.0–18.0)

## 2015-11-03 MED ORDER — VITAMIN B-1 100 MG PO TABS
100.0000 mg | ORAL_TABLET | Freq: Every day | ORAL | Status: DC
  Administered 2015-11-03 – 2015-11-04 (×2): 100 mg via ORAL
  Filled 2015-11-03 (×2): qty 1

## 2015-11-03 MED ORDER — LORAZEPAM 1 MG PO TABS
1.0000 mg | ORAL_TABLET | Freq: Four times a day (QID) | ORAL | Status: DC | PRN
  Administered 2015-11-03: 1 mg via ORAL
  Filled 2015-11-03: qty 1

## 2015-11-03 MED ORDER — FOLIC ACID 1 MG PO TABS
1.0000 mg | ORAL_TABLET | Freq: Every day | ORAL | Status: DC
  Administered 2015-11-03 – 2015-11-04 (×2): 1 mg via ORAL
  Filled 2015-11-03 (×2): qty 1

## 2015-11-03 MED ORDER — POTASSIUM CHLORIDE 20 MEQ PO PACK
40.0000 meq | PACK | Freq: Once | ORAL | Status: AC
  Administered 2015-11-03: 40 meq via ORAL
  Filled 2015-11-03: qty 2

## 2015-11-03 MED ORDER — LORAZEPAM 2 MG/ML IJ SOLN
1.0000 mg | Freq: Four times a day (QID) | INTRAMUSCULAR | Status: DC | PRN
  Filled 2015-11-03: qty 1

## 2015-11-03 MED ORDER — IBUPROFEN 400 MG PO TABS
400.0000 mg | ORAL_TABLET | Freq: Four times a day (QID) | ORAL | Status: DC | PRN
  Administered 2015-11-04: 400 mg via ORAL
  Filled 2015-11-03 (×2): qty 1

## 2015-11-03 MED ORDER — CARBAMAZEPINE 100 MG PO CHEW
100.0000 mg | CHEWABLE_TABLET | Freq: Once | ORAL | Status: AC
  Administered 2015-11-03: 100 mg via ORAL
  Filled 2015-11-03: qty 1

## 2015-11-03 MED ORDER — VITAMIN B-1 100 MG PO TABS
100.0000 mg | ORAL_TABLET | Freq: Every day | ORAL | Status: DC
  Administered 2015-11-04: 100 mg via ORAL
  Filled 2015-11-03: qty 1

## 2015-11-03 MED ORDER — ADULT MULTIVITAMIN W/MINERALS CH
1.0000 | ORAL_TABLET | Freq: Every day | ORAL | Status: DC
  Administered 2015-11-03 – 2015-11-04 (×2): 1 via ORAL
  Filled 2015-11-03 (×2): qty 1

## 2015-11-03 MED ORDER — THIAMINE HCL 100 MG/ML IJ SOLN
100.0000 mg | Freq: Every day | INTRAMUSCULAR | Status: DC
  Filled 2015-11-03: qty 2

## 2015-11-03 MED ORDER — POTASSIUM CHLORIDE CRYS ER 20 MEQ PO TBCR
40.0000 meq | EXTENDED_RELEASE_TABLET | Freq: Two times a day (BID) | ORAL | Status: AC
  Administered 2015-11-03: 40 meq via ORAL
  Filled 2015-11-03: qty 2

## 2015-11-03 MED ORDER — CARBAMAZEPINE 200 MG PO TABS
300.0000 mg | ORAL_TABLET | Freq: Two times a day (BID) | ORAL | Status: DC
  Administered 2015-11-03 – 2015-11-04 (×2): 300 mg via ORAL
  Filled 2015-11-03: qty 2
  Filled 2015-11-03 (×2): qty 1.5

## 2015-11-03 MED ORDER — LEVETIRACETAM 250 MG PO TABS
250.0000 mg | ORAL_TABLET | Freq: Once | ORAL | Status: AC
  Administered 2015-11-03: 250 mg via ORAL
  Filled 2015-11-03: qty 1

## 2015-11-03 NOTE — Consult Note (Signed)
Reason for Consult:AMS Referring Physician: Winona LegatoVaickute  CC: AMS  HPI: Johnathan Rivera is an 30 y.o. male with a history of ETOH abuse and seizures who is somewhat confused about his history.  History obtained from the chart.  Patient presented yesterday lethargic, altered, tachycardic and with an elevated blood pressure. Is also some question of some possible seizure activity on the day prior per his mother. He has had a history of withdrawal seizures in the past.  Had been seen in the ED on 5/1 with seizures.    Past Medical History  Diagnosis Date  . Seizures (HCC)   . Polysubstance abuse   . Pancreatitis   . Asthma   . Alcohol dependence (HCC)   . Hypertension   . GERD (gastroesophageal reflux disease)     Past Surgical History  Procedure Laterality Date  . No past surgeries    . Esophagogastroduodenoscopy Left 08/18/2015    Procedure: ESOPHAGOGASTRODUODENOSCOPY (EGD);  Surgeon: Wallace CullensPaul Y Oh, MD;  [facility]COPY;  Service: Endoscopy;  Laterality: Left;    Family History  Problem Relation Age of Onset  . Diabetes Mother   . Liver disease Neg Hx   . Colon cancer Neg Hx   . Lung cancer Father     Social History:  reports that he has been smoking Cigarettes.  He has a 10 pack-year smoking history. He has never used smokeless tobacco. He reports that he drinks alcohol. He reports that he uses illicit drugs (Marijuana and Benzodiazepines).  No Known Allergies  Medications:  I have reviewed the patient's current medications. Prior to Admission:  Prescriptions prior to admission  Medication Sig Dispense Refill Last Dose  . busPIRone (BUSPAR) 10 MG tablet Take 1 tablet (10 mg total) by mouth 3 (three) times daily. 90 tablet 0 11/01/2015 at Unknown time  . carbamazepine (TEGRETOL) 200 MG tablet Take 1 tablet (200 mg total) by mouth 2 (two) times daily. 60 tablet 0 11/01/2015 at Unknown time  . hydrALAZINE (APRESOLINE) 25 MG tablet Take 1 tablet (25 mg total) by mouth 3  (three) times daily. 90 tablet 0 11/01/2015 at Unknown time  . hydrOXYzine (VISTARIL) 50 MG capsule Take 1 capsule (50 mg total) by mouth 3 (three) times daily as needed for anxiety. 30 capsule 0 11/01/2015 at Unknown time  . Multiple Vitamin (MULTIVITAMIN WITH MINERALS) TABS tablet Take 1 tablet by mouth daily. 30 tablet 0 11/01/2015 at Unknown time   Scheduled: . antiseptic oral rinse  7 mL Mouth Rinse BID  . carbamazepine  100 mg Oral Once  . carbamazepine  300 mg Oral BID  . cloNIDine  0.3 mg Transdermal Weekly  . enoxaparin (LOVENOX) injection  40 mg Subcutaneous Q24H  . levETIRAcetam  250 mg Oral Once  . nicotine  14 mg Transdermal Daily  . pneumococcal 23 valent vaccine  0.5 mL Intramuscular Tomorrow-1000  . sodium chloride flush  3 mL Intravenous Q12H  . thiamine IV  100 mg Intravenous Q24H    ROS: History obtained from the patient  General ROS: negative for - chills, fatigue, fever, night sweats, weight gain or weight loss Psychological ROS: anxiety Ophthalmic ROS: negative for - blurry vision, double vision, eye pain or loss of vision ENT ROS: negative for - epistaxis, nasal discharge, oral lesions, sore throat, tinnitus or vertigo Allergy and Immunology ROS: negative for - hives or itchy/watery eyes Hematological and Lymphatic ROS: negative for - bleeding problems, bruising or swollen lymph nodes Endocrine ROS: negative for - galactorrhea,  hair pattern changes, polydipsia/polyuria or temperature intolerance Respiratory ROS: negative for - cough, hemoptysis, shortness of breath or wheezing Cardiovascular ROS: negative for - chest pain, dyspnea on exertion, edema or irregular heartbeat Gastrointestinal ROS: negative for - abdominal pain, diarrhea, hematemesis, nausea/vomiting or stool incontinence Genito-Urinary ROS: negative for - dysuria, hematuria, incontinence or urinary frequency/urgency Musculoskeletal ROS: negative for - joint swelling or muscular weakness Neurological ROS:  as noted in HPI Dermatological ROS: negative for rash and skin lesion changes  Physical Examination: Blood pressure 144/94, pulse 92, temperature 98.5 F (36.9 C), temperature source Oral, resp. rate 18, height  (1.803 m), weight 81.647 kg (180 lb), SpO2 100 %.  HEENT-  Normocephalic, no lesions, without obvious abnormality.  Normal external eye and conjunctiva.  Normal TM's bilaterally.  Normal auditory canals and external ears. Normal external nose, mucus membranes and septum.  Normal pharynx. Cardiovascular- S1, S2 normal, pulses palpable throughout   Lungs- chest clear, no wheezing, rales, normal symmetric air entry Abdomen- soft, non-tender; bowel sounds normal; no masses,  no organomegaly Extremities- no edema Lymph-no adenopathy palpable Musculoskeletal-no joint tenderness, deformity or swelling Skin-warm and dry, no hyperpigmentation, vitiligo, or suspicious lesions  Neurological Examination Mental Status: Alert, oriented but some mild confusion noted in obtaining history.  Slight agitation.  Speech fluent without evidence of aphasia.  Able to follow 3 step commands but requires some reinforcement. Cranial Nerves: II: Discs flat bilaterally; Visual fields grossly normal, pupils equal, round, reactive to light and accommodation III,IV, VI: ptosis not present, extra-ocular motions intact bilaterally V,VII: smile symmetric, facial light touch sensation normal bilaterally VIII: hearing normal bilaterally IX,X: gag reflex present XI: bilateral shoulder shrug XII: midline tongue extension Motor: Right : Upper extremity   5/5    Left:     Upper extremity   5/5  Lower extremity   5/5     Lower extremity   5/5 Tone and bulk:normal tone throughout; no atrophy noted Sensory: Pinprick and light touch intact throughout, bilaterally Deep Tendon Reflexes: 2+ and symmetric throughout Plantars: Right: downgoing   Left: downgoing Cerebellar: Normal finger-to-nose and normal  heel-to-shin testing bilaterally Gait: not tested due to safety concerns    Laboratory Studies:   Basic Metabolic Panel:  Recent Labs Lab 10/30/15 1647 11/01/15 2301 11/02/15 0418 11/02/15 1534 11/03/15 0444 11/03/15 0529  NA 145 144 141  --   --   --   K 3.6 3.2* 3.1* 3.1* 3.0*  --   CL 105 107 107  --   --   --   CO2 --   --   --   GLUCOSE 116* 160* 111*  --   --   --   BUN --   --   --   CREATININE 0.87 0.81 0.78  --   --   --   CALCIUM 9.7 9.3 8.6*  --   --   --   MG 2.0  --  2.0  --   --   --   PHOS 3.5  --   --   --   --  3.3    Liver Function Tests:  Recent Labs Lab 10/30/15 1647 11/01/15 2301  AST 50* 45*  ALT 40 34  ALKPHOS 81 101  BILITOT 0.4 0.5  PROT 7.9 8.0  ALBUMIN 4.4 4.3    Recent Labs Lab 10/30/15 1647 11/01/15 2301  LIPASE 13 13   No results for input(s): AMMONIA in the last  168 hours.  CBC:  Recent Labs Lab 10/30/15 1647 11/01/15 2301 11/02/15 0418 11/03/15 0444  WBC 7.0 7.5 6.3  --   NEUTROABS 3.5  --   --   --   HGB 13.0 13.4 12.4* 12.0*  HCT 37.4* 38.6* 36.1*  --   MCV 92.4 92.6 91.8  --   PLT 300 281 263  --     Cardiac Enzymes: No results for input(s): CKTOTAL, CKMB, CKMBINDEX, TROPONINI in the last 168 hours.  BNP: Invalid input(s): POCBNP  CBG:  Recent Labs Lab 11/02/15 0110 11/02/15 0350  GLUCAP 124* 127*    Microbiology: Results for orders placed or performed during the hospital encounter of 11/01/15  Urine culture     Status: None (Preliminary result)   Collection Time: 11/02/15  3:39 AM  Result Value Ref Range Status   Specimen Description URINE, CLEAN CATCH  Final   Special Requests Normal  Final   Culture NO GROWTH < 24 HOURS  Final   Report Status PENDING  Incomplete  MRSA PCR Screening     Status: None   Collection Time: 11/02/15  3:50 AM  Result Value Ref Range Status   MRSA by PCR NEGATIVE NEGATIVE Final    Comment:        The GeneXpert MRSA Assay (FDA approved for NASAL  specimens only), is one component of a comprehensive MRSA colonization surveillance program. It is not intended to diagnose MRSA infection nor to guide or monitor treatment for MRSA infections.     Coagulation Studies: No results for input(s): LABPROT, INR in the last 72 hours.  Urinalysis:  Recent Labs Lab 11/02/15 0339  COLORURINE YELLOW*  LABSPEC 1.015  PHURINE 9.0*  GLUCOSEU NEGATIVE  HGBUR 1+*  BILIRUBINUR NEGATIVE  KETONESUR TRACE*  PROTEINUR 30*  NITRITE NEGATIVE  LEUKOCYTESUR NEGATIVE    Lipid Panel:     Component Value Date/Time   CHOL 142 03/21/2015 0145   TRIG 193* 03/21/2015 0145   HDL 49 03/21/2015 0145   CHOLHDL 2.9 03/21/2015 0145   VLDL 39 03/21/2015 0145   LDLCALC 54 03/21/2015 0145    HgbA1C: No results found for: HGBA1C  Urine Drug Screen:     Component Value Date/Time   LABOPIA NONE DETECTED 11/02/2015 0339   COCAINSCRNUR NONE DETECTED 11/02/2015 0339   LABBENZ POSITIVE* 11/02/2015 0339   AMPHETMU NONE DETECTED 11/02/2015 0339   THCU POSITIVE* 11/02/2015 0339   LABBARB NONE DETECTED 11/02/2015 0339    Alcohol Level:  Recent Labs Lab 10/30/15 1647 11/01/15 2301  ETH 124* 135*    Other results: EKG: sinus rhythm at 94 bpm.  Imaging: Ct Head Wo Contrast  11/02/2015  CLINICAL DATA:  Altered mental status. Presenting with delirium tremens. Previous admissions for alcohol withdrawal. EXAM: CT HEAD WITHOUT CONTRAST TECHNIQUE: Contiguous axial images were obtained from the base of the skull through the vertex without intravenous contrast. COMPARISON:  10/30/2015 FINDINGS: Ventricles and sulci appear symmetrical. No ventricular dilatation. No mass effect or midline shift. No abnormal extra-axial fluid collections. Gray-white matter junctions are distinct. Basal cisterns are not effaced. No evidence of acute intracranial hemorrhage. No depressed skull fractures. Visualized paranasal sinuses and mastoid air cells are not opacified. IMPRESSION:  No acute intracranial abnormalities. Electronically Signed   By: Burman Nieves M.D.   On: 11/02/2015 03:18     Assessment/Plan: 30 year old male with a history of ETOH abuse and seizures who presents altered.  Likely in DT's with possibly some component of breakthrough seizure  prior to presentation as well.  Patient on Tegretol at home.  Keppra has been added.  Patient does not wish to continue on Keppra. Tegretol level is low normal.  Seizures may not only be ETOH withdrawal related but it appears that patient takes benzos when he can get them as well and may withdraw from them.  Although patient with ETOH level of 135 on presentation this may actually represent a much lower level than he usually has.  Patient appears to still be grappling with discontinuation of ETOH despite multple rehab attempts.    Recommendations: 1.  Agree with CIWA 2.  Agree with seizure precautions 3.  D/C Keppra.  Will give next dose at 250mg  and then discontinue administration 4.  Increase Tegretol to 300mg  BID starting this morning.   5.  Patient unable to drive, operate heavy machinery, perform activities at heights and participate in water activities until release by outpatient physician.  Thana Farr, MD Neurology [phone] 11/03/2015, 10:58 AM

## 2015-11-03 NOTE — Progress Notes (Signed)
[facility] [place] [facility]   PATIENT NAME: Johnathan Rivera    MR#:  [id number]  DATE OF BIRTH:  April 07, 1986  SUBJECTIVE:  CHIEF COMPLAINT:   Chief Complaint  Patient presents with  . Emesis  Patient is a 30 year old African-American male with past medical history significant for history of alcohol abuse who has been in and out from a rehabilitation program who presents to the hospital with altered mental status, tachycardia, elevated blood pressure, possible seizure episode yesterday. On arrival to the hospital patient was felt to have delirium tremens and admitted to the hospital. Patient was poorly arousable yesterday, however, after time he became more and more arousable and exhibited signs of significant difficulty. No cholesterol with total cholesterol scale, reaching 15 and above even last night. Patient feels good today and wants to go home, request Librium for home as well as some pain medications for aching tooth. Patient was seen by neurologist, who recommended to advanced Tegretol dose, discontinue Keppra.   Review of Systems  Constitutional: Negative for fever, chills and weight loss.  HENT: Negative for congestion.   Eyes: Negative for blurred vision and double vision.  Respiratory: Negative for cough, sputum production, shortness of breath and wheezing.   Cardiovascular: Negative for chest pain, palpitations, orthopnea, leg swelling and PND.  Gastrointestinal: Negative for nausea, vomiting, abdominal pain, diarrhea, constipation and blood in stool.  Genitourinary: Negative for dysuria, urgency, frequency and hematuria.  Musculoskeletal: Negative for falls.  Neurological: Negative for dizziness, tremors, focal weakness and headaches.  Endo/Heme/Allergies: Does not bruise/bleed easily.  Psychiatric/Behavioral: Negative for depression. The patient does not have insomnia.     VITAL SIGNS: Blood pressure 152/91, pulse 87, temperature 98.4 F  (36.9 C), temperature source Oral, resp. rate 20, height  (1.803 m), weight 81.647 kg (180 lb), SpO2 100 %.  PHYSICAL EXAMINATION:   GENERAL:  30 y.o.-year-old patient lying in the bed with no acute distress. Alert, comfortable, engaging in conversation, no signs of withdrawal, tremors, anxiety EYES: Pupils equal, round, reactive to light and accommodation. No scleral icterus. Extraocular muscles intact.  HEENT: Head atraumatic, normocephalic. Oropharynx and nasopharynx clear. Tender molar palpation in the right lower jaw, chipped tooth, no gum discomfort on palpation NECK:  Supple, no jugular venous distention. No thyroid enlargement, no tenderness.  LUNGS: Normal breath sounds bilaterally, no wheezing, rales,rhonchi or crepitation. No use of accessory muscles of respiration.  CARDIOVASCULAR: S1, S2 , Tachycardic. No murmurs, rubs, or gallops.  ABDOMEN: Soft, nontender, nondistended. Bowel sounds present. No organomegaly or mass.  EXTREMITIES: No pedal edema, cyanosis, or clubbing.  NEUROLOGIC: Cranial nerves II through XII are grossly  intact. Muscle strength 5/5 in  low. Her extremities, minimal movements in upper extremities, not following commands, not squeezing fingers. Sensation not able to assess . Gait not checked. Slurring speech PSYCHIATRIC: Alert, comfortable, engaging in conversation, no tremors, anxiety noted.  SKIN: No obvious rash, lesion, or ulcer.   ORDERS/RESULTS REVIEWED:   CBC  Recent Labs Lab 10/30/15 1647 11/01/15 2301 11/02/15 0418 11/03/15 0444  WBC 7.0 7.5 6.3  --   HGB 13.0 13.4 12.4* 12.0*  HCT 37.4* 38.6* 36.1*  --   PLT 300 281 263  --   MCV 92.4 92.6 91.8  --   MCH 32.1 32.3 31.6  --   MCHC 34.7 34.8 34.4  --   RDW 15.9* 15.7* 15.8*  --   LYMPHSABS 2.7  --   --   --   MONOABS  0.7  --   --   --   EOSABS 0.0  --   --   --   BASOSABS 0.0  --   --   --     ------------------------------------------------------------------------------------------------------------------  Chemistries   Recent Labs Lab 10/30/15 1647 11/01/15 2301 11/02/15 0418 11/02/15 1534 11/03/15 0444  NA 145 144 141  --   --   K 3.6 3.2* 3.1* 3.1* 3.0*  CL 105 107 107  --   --   CO2 23 25 23   --   --   GLUCOSE 116* 160* 111*  --   --   BUN 8 6 6   --   --   CREATININE 0.87 0.81 0.78  --   --   CALCIUM 9.7 9.3 8.6*  --   --   MG 2.0  --  2.0  --   --   AST 50* 45*  --   --   --   ALT 40 34  --   --   --   ALKPHOS 81 101  --   --   --   BILITOT 0.4 0.5  --   --   --    ------------------------------------------------------------------------------------------------------------------ estimated creatinine clearance is 141.2 mL/min (by C-G formula based on Cr of 0.78). ------------------------------------------------------------------------------------------------------------------ No results for input(s): TSH, T4TOTAL, T3FREE, THYROIDAB in the last 72 hours.  Invalid input(s): FREET3  Cardiac Enzymes No results for input(s): CKMB, TROPONINI, MYOGLOBIN in the last 168 hours.  Invalid input(s): CK ------------------------------------------------------------------------------------------------------------------ Invalid input(s): POCBNP ---------------------------------------------------------------------------------------------------------------  RADIOLOGY: Ct Head Wo Contrast  11/02/2015  CLINICAL DATA:  Altered mental status. Presenting with delirium tremens. Previous admissions for alcohol withdrawal. EXAM: CT HEAD WITHOUT CONTRAST TECHNIQUE: Contiguous axial images were obtained from the base of the skull through the vertex without intravenous contrast. COMPARISON:  10/30/2015 FINDINGS: Ventricles and sulci appear symmetrical. No ventricular dilatation. No mass effect or midline shift. No abnormal extra-axial fluid collections. Gray-white matter junctions are  distinct. Basal cisterns are not effaced. No evidence of acute intracranial hemorrhage. No depressed skull fractures. Visualized paranasal sinuses and mastoid air cells are not opacified. IMPRESSION: No acute intracranial abnormalities. Electronically Signed   By: Burman Nieves M.D.   On: 11/02/2015 03:18    EKG:  Orders placed or performed during the hospital encounter of 10/30/15  . ED EKG  . ED EKG  . EKG 12-Lead  . EKG 12-Lead  . EKG 12-Lead  . EKG 12-Lead  . EKG 12-Lead  . EKG 12-Lead  . EKG 12-Lead  . EKG 12-Lead  . ED EKG  . ED EKG  . EKG 12-Lead  . EKG 12-Lead  . EKG    ASSESSMENT AND PLAN:  Principal Problem:   Delirium tremens (HCC) Active Problems:   Anxiety   Seizure disorder (HCC)   HTN (hypertension)   GERD (gastroesophageal reflux disease) #1. DTs, improving overall, continue alcohol was also protocol, follow clinically, continue thiamine, questionable discharge home on Librium tapering if scale is below 5-10 consistently over 12-24 hours  CT of the head on admission showed no acute intracranial abnormality   #2. Hypokalemia, supplemented intravenously, however level is still low, supplement orally, check it in the morning, magnesium level was 2.0 yesterday, physiologic #3. Sinus tachycardia, due to DTs, supportive therapy, improved on labetalol as needed #4. Seizure disorder, advanced Tegretol level, now off Keppra, improved clinically, supportive therapy, Ativan as needed. Discussed with Dr. Thad Ranger, neurologist #5. Tobacco abuse, nicotine replacement therapy to be continued. Discussed for  4 minutes #6. Cannabis abuse, supportive therapy, may benefit from intensive substance abuse program #7. Altered mental status, likely metabolic encephalopathy, due to Ativan, Keppra, DTs, improved significantly with therapy. Discussed with Dr. Thad Rangereynolds, neurologist Management plans discussed with the patient, family and they are in agreement.   DRUG ALLERGIES: No Known  Allergies  CODE STATUS:     Code Status Orders        Start     Ordered   11/02/15 0352  Full code   Continuous     11/02/15 0351    Code Status History    Date Active Date Inactive Code Status Order ID Comments User Context   11/02/2015 12:26 AM 11/02/2015  3:51 AM Full Code [id number]  Darci Currentandolph N Brown, MD ED   09/21/2015  3:48 AM 09/21/2015  5:52 PM Full Code [id number]  Oralia Manisavid Willis, MD Inpatient   09/21/2015  1:41 AM 09/21/2015  3:48 AM Full Code [id number]  Darci Currentandolph N Brown, MD ED   08/18/2015  1:34 AM 08/18/2015  6:06 PM Full Code [id number]  Marguarite ArbourJeffrey D Sparks, MD Inpatient   05/22/2015  4:00 PM 05/24/2015  1:18 PM Full Code [id number]  Shaune PollackQing Chen, MD Inpatient   03/21/2015  9:07 AM 03/23/2015  7:45 PM Full Code [id number]  Crissie FiguresEdavally N Reddy, MD Inpatient   01/04/2015 12:52 PM 01/06/2015  1:06 PM Full Code [id number]  Gale Journeyatherine P Walsh, MD Inpatient      TOTAL TIME TAKING CARE OF THIS PATIENT: 40 minutes.    Katharina CaperVAICKUTE,Aravind Chrismer M.D on 11/03/2015 at 3:21 PM  Between 7am to 6pm - Pager - [phone]  After 6pm go to [email or web address] - password [email or web address] [facility]  [facility]  Office  [phone]  CC: Primary care physician; Lanier EnsignSOLES, MEREDITH KEY, MD

## 2015-11-03 NOTE — Progress Notes (Signed)
Initial Nutrition Assessment    INTERVENTION:  -Monitor intake and cater to pt preferences   NUTRITION DIAGNOSIS:    (none at this time) related to   as evidenced by  .    GOAL:   Patient will meet greater than or equal to 90% of their needs    MONITOR:   PO intake  REASON FOR ASSESSMENT:   Malnutrition Screening Tool    ASSESSMENT:   30 y/o male admitted with AMS, questionable seizure from Etoh use and/or medication/drug use  Past Medical History  Diagnosis Date  . Seizures (HCC)   . Polysubstance abuse   . Pancreatitis   . Asthma   . Alcohol dependence (HCC)   . Hypertension   . GERD (gastroesophageal reflux disease)      Pt reports 100% of lunch and breakfast this am and tolerated well.  Reports fairly normal intake prior to admission.  Could not get more information from pt regarding intake. Pt seemed frustrated this Clinical research associatewriter was speaking with him, wanted to sleep.  Medications reviewed  thaimine, D5 NS with KCl at 12025ml/hr Labs reviewed K 3.0   Unable to complete Nutrition-Focused physical exam at this time.    Diet Order:  Diet regular Room service appropriate?: Yes; Fluid consistency:: Thin  Skin:  Reviewed, no issues  Last BM:  5/5  Height:   Ht Readings from Last 1 Encounters:  11/01/15 5\' 11"  (1.803 m)    Weight: pt reports stable wt  Wt Readings from Last 1 Encounters:  11/01/15 180 lb (81.647 kg)    Ideal Body Weight:     BMI:  Body mass index is 25.12 kg/(m^2).  Estimated Nutritional Needs:   Kcal:  2025-2430  Protein:  101-121 g/d  Fluid:  >/=2 L/d  EDUCATION NEEDS:   No education needs identified at this time  Rie Mcneil B. Freida BusmanAllen, RD, LDN [phone] (pager) Weekend/On-Call pager [phone])

## 2015-11-03 NOTE — Care Management (Signed)
Patient transferred to 2c from CCU for delirium tremens.  Patient has history of seizures.  Patient states that he goes to primary care [facility]ignMeredith Soles [facility], and obtains his medications from there. Patient states that if he is not able to afford his medication his mother assists him financialy.  Patients girlfriend is at bedside.  [place]  The plan is for the patient to move there with her, and get established with her. "i am planning on getting enough of my seizure medication prescribed to last me till i get a new doctor".  Patient is self pay patient.  RNCM following for discharge medication.

## 2015-11-03 NOTE — Progress Notes (Signed)
MEDICATION RELATED CONSULT NOTE   Pharmacy Consult for electrolyte monitoring   No Known Allergies  Patient Measurements: Height: 5\' 11"  (180.3 cm) Weight: 180 lb (81.647 kg) IBW/kg (Calculated) : 75.3   Vital Signs: Temp: 98.5 F (36.9 C) (05/05 0637) Temp Source: Oral (05/05 0637) BP: 144/94 mmHg (05/05 0637) Pulse Rate: 92 (05/05 0800) Intake/Output from previous day: 05/04 0701 - 05/05 0700 In: 2832.5 [P.O.:60; I.V.:1962.5; IV Piggyback:810] Out: 601 [Urine:600; Stool:1] Intake/Output from this shift: Total I/O In: 1116.2 [P.O.:480; I.V.:636.2] Out: -   Labs:  Recent Labs  11/01/15 2301 11/02/15 0418 11/03/15 0444 11/03/15 0529  WBC 7.5 6.3  --   --   HGB 13.4 12.4* 12.0*  --   HCT 38.6* 36.1*  --   --   PLT 281 263  --   --   CREATININE 0.81 0.78  --   --   MG  --  2.0  --   --   PHOS  --   --   --  3.3  ALBUMIN 4.3  --   --   --   PROT 8.0  --   --   --   AST 45*  --   --   --   ALT 34  --   --   --   ALKPHOS 101  --   --   --   BILITOT 0.5  --   --   --    Estimated Creatinine Clearance: 141.2 mL/min (by C-G formula based on Cr of 0.78).   Assessment: Pharmacy consulted for electrolyte management for 30 yo male admitted with delirium tremens. Patient currently receiving D5/NS with 20mEq of KCL/L at 13125mL/hr. Patient received potassium 40mEq PO x 1 this am.    Plan:  Will obtain follow up potassium level at 1800.   Patient has history significant for seizures, will maintain magensium >/=2 during admission.   Pharmacy will continue to monitor and adjust per consult.   Ife Vitelli,Joaovictor L 11/03/2015,12:59 PM

## 2015-11-03 NOTE — Progress Notes (Signed)
CONCERNING: IV to Oral Route Change Policy  RECOMMENDATION: This patient is receiving thiamine by the intravenous route.  Based on criteria approved by the Pharmacy and Therapeutics Committee, the intravenous medication(s) is/are being converted to the equivalent oral dose form(s).   DESCRIPTION: These criteria include:  The patient is eating (either orally or via tube) and/or has been taking other orally administered medications for a least 24 hours  The patient has no evidence of active gastrointestinal bleeding or impaired GI absorption (gastrectomy, short bowel, patient on TNA or NPO).  If you have questions about this conversion, please contact the Pharmacy Department  []   [phone]( [phone] )  [facility] [x]   [phone]( [phone] )  [facility] []   [phone]  [facility] []   [phone]( [phone] )  [facility] []   [phone]( [phone] )  [facility]   Simpson,Norvel L, [facility] LPRPH 11/03/2015 12:44 PM

## 2015-11-04 LAB — URINE CULTURE
Culture: NO GROWTH
Special Requests: NORMAL

## 2015-11-04 LAB — BASIC METABOLIC PANEL
Anion gap: 8 (ref 5–15)
CALCIUM: 8.9 mg/dL (ref 8.9–10.3)
CO2: 22 mmol/L (ref 22–32)
CREATININE: 0.69 mg/dL (ref 0.61–1.24)
Chloride: 106 mmol/L (ref 101–111)
GFR calc Af Amer: >60 mL/min (ref >60)
Glucose, Bld: 105 mg/dL — ABNORMAL HIGH (ref 65–99)
Potassium: 3.6 mmol/L (ref 3.5–5.1)
SODIUM: 136 mmol/L (ref 135–145)

## 2015-11-04 LAB — MAGNESIUM: MAGNESIUM: 1.6 mg/dL — AB (ref 1.7–2.4)

## 2015-11-04 MED ORDER — PNEUMOCOCCAL VAC POLYVALENT 25 MCG/0.5ML IJ INJ: 0.5000 mL | INJECTION | INTRAMUSCULAR | Status: AC

## 2015-11-04 MED ORDER — MAGNESIUM 30 MG PO TABS: 30.0000 mg | ORAL_TABLET | Freq: Two times a day (BID) | ORAL | Status: AC

## 2015-11-04 MED ORDER — THIAMINE HCL 100 MG PO TABS: 100.0000 mg | ORAL_TABLET | Freq: Every day | ORAL | Status: AC

## 2015-11-04 MED ORDER — AMOXICILLIN 500 MG PO CAPS: 500.0000 mg | ORAL_CAPSULE | Freq: Three times a day (TID) | ORAL | Status: AC

## 2015-11-04 MED ORDER — CARBAMAZEPINE 200 MG PO TABS: 300.0000 mg | ORAL_TABLET | Freq: Two times a day (BID) | ORAL | Status: AC

## 2015-11-04 MED ORDER — MAGNESIUM SULFATE 2 GM/50ML IV SOLN
2.0000 g | Freq: Once | INTRAVENOUS | Status: AC
  Administered 2015-11-04: 2 g via INTRAVENOUS
  Filled 2015-11-04: qty 50

## 2015-11-04 MED ORDER — NICOTINE 14 MG/24HR TD PT24: 14.0000 mg | MEDICATED_PATCH | Freq: Every day | TRANSDERMAL | Status: AC

## 2015-11-04 MED ORDER — FOLIC ACID 1 MG PO TABS: 1.0000 mg | ORAL_TABLET | Freq: Every day | ORAL | Status: AC

## 2015-11-04 NOTE — Progress Notes (Signed)
Pt d/c to home today.  IV removed intact.  Rx's given to pt w/all questions and concerns addressed.  D/C paperwork reviewed and education provided with all questions and concerns addressed.  Pt has a friend at bedside for home transport.

## 2015-11-04 NOTE — Progress Notes (Signed)
Pt anxious and agitated throughout night wanting to be DC'ed. Ativan 2 mg given but pt noted that it does not help. Pt also complaining of a tooth ache, Tylenol and Ibuprofen given but Pt noted it did not help, he needs to see a dentist. Pt continous pulse Ox DC'ed this shift as he noted that it was aggravating him  and so he kept pulling it off. SAT's WDL throughout shift.

## 2015-11-04 NOTE — Discharge Summary (Addendum)
[facility] - [place] [facility]   PATIENT NAME: Johnathan Rivera    MR#:  [id number]  DATE OF BIRTH:  26-Jul-1985  DATE OF ADMISSION:  11/01/2015 ADMITTING PHYSICIAN: Oralia Manis, MD  DATE OF DISCHARGE: 11/04/15 PRIMARY CARE PHYSICIAN: SOLES, MEREDITH KEY, MD    ADMISSION DIAGNOSIS:  Delirium tremens (HCC) [F10.231]  DISCHARGE DIAGNOSIS:  Principal Problem:   Delirium tremens (HCC) Active Problems:   Anxiety   Seizure disorder (HCC)   HTN (hypertension)   GERD (gastroesophageal reflux disease)   SECONDARY DIAGNOSIS:   Past Medical History  Diagnosis Date  . Seizures (HCC)   . Polysubstance abuse   . Pancreatitis   . Asthma   . Alcohol dependence (HCC)   . Hypertension   . GERD (gastroesophageal reflux disease)     HOSPITAL COURSE:   #1. DTs, improving overall, implemented alcohol withdrawl protocol during hospital course, clinically doing fine.  continue thiamine, CIWA  scale is below 5  consistently over 12-24 hours CT of the head on admission showed no acute intracranial abnormality . Outpatient follow-up with alcohol rehabilitation center is recommended #2. Hypokalemia, supplemented intravenously, supplement orally, potassium at 3.6 today magnesium level is 1.6 during 2 g of magnesium sulfate and will discharge him home with magnesium oxide supplements #3. Sinus tachycardia, due to DTs, supportive therapy, improved . #4. Seizure disorder, increased her Tegretol dose to 300 mg by mouth twice a day as recommended by Dr. Thad Ranger, now off Keppra, improved clinically, supportive therapy, Ativan as needed. Discussed with Dr. Thad Ranger, neurologist, recommended not to work on heavy machinery, water activity until cleared by neurology/pcp. She's not recommending Librium tapping at this point as patient doesn't seem to be reliable #5. Tobacco abuse, nicotine replacement therapy to be continued. Discussed for 4 minutes #6. Cannabis abuse, supportive  therapy, may benefit from intensive substance abuse program #7. Altered mental status, likely metabolic encephalopathy, due to Ativan, Keppra, DTs, improved significantly with therapy. Discussed with Dr. Thad Ranger, neurologist #8 possible tooth infection with tooth pain-recommended amoxicillin by mouth every 8 hours and outpatient follow-up with dentist, prescription given for 7 days. Management plans discussed with the patient, family and they are in agreement.  DISCHARGE CONDITIONS:  fair  CONSULTS OBTAINED:  Treatment Team:  Kym Groom, MD Thana Farr, MD   PROCEDURES none   DRUG ALLERGIES:  No Known Allergies  DISCHARGE MEDICATIONS:   Current Discharge Medication List    START taking these medications   Details  amoxicillin (AMOXIL) 500 MG capsule Take 1 capsule (500 mg total) by mouth 3 (three) times daily. Qty: 21 capsule, Refills: 0    folic acid (FOLVITE) 1 MG tablet Take 1 tablet (1 mg total) by mouth daily.    magnesium 30 MG tablet Take 1 tablet (30 mg total) by mouth 2 (two) times daily. Qty: 30 tablet, Refills: 0    nicotine (NICODERM CQ - DOSED IN MG/24 HOURS) 14 mg/24hr patch Place 1 patch (14 mg total) onto the skin daily. Qty: 28 patch, Refills: 0    pneumococcal 23 valent vaccine (PNU-IMMUNE) 25 MCG/0.5ML injection Inject 0.5 mLs into the muscle tomorrow at 10 am. Qty: 2.5 mL, Refills: 0    thiamine 100 MG tablet Take 1 tablet (100 mg total) by mouth daily.      CONTINUE these medications which have CHANGED   Details  carbamazepine (TEGRETOL) 200 MG tablet Take 1.5 tablets (300 mg total) by mouth 2 (two) times daily. Qty: 90 tablet, Refills: 0  CONTINUE these medications which have NOT CHANGED   Details  busPIRone (BUSPAR) 10 MG tablet Take 1 tablet (10 mg total) by mouth 3 (three) times daily. Qty: 90 tablet, Refills: 0    hydrALAZINE (APRESOLINE) 25 MG tablet Take 1 tablet (25 mg total) by mouth 3 (three) times daily. Qty: 90  tablet, Refills: 0    hydrOXYzine (VISTARIL) 50 MG capsule Take 1 capsule (50 mg total) by mouth 3 (three) times daily as needed for anxiety. Qty: 30 capsule, Refills: 0    Multiple Vitamin (MULTIVITAMIN WITH MINERALS) TABS tablet Take 1 tablet by mouth daily. Qty: 30 tablet, Refills: 0         DISCHARGE INSTRUCTIONS:   Activity as tolerated and instructed not to operate heavy machinery or walker and is cleared by neurology R primary care physician Follow-up with primary care physician in 3-5 days  follow-up with neurology in 1weeks   DIET:  Regular diet  DISCHARGE CONDITION:  Fair  ACTIVITY:  Activity as tolerated and instructed not to operate heavy machinery or walker and is cleared by neurology R primary care physician  OXYGEN:  Home Oxygen: No.   Oxygen Delivery: room air  DISCHARGE LOCATION:  home   If you experience worsening of your admission symptoms, develop shortness of breath, life threatening emergency, suicidal or homicidal thoughts you must seek medical attention immediately by calling 911 or calling your MD immediately  if symptoms less severe.  You Must read complete instructions/literature along with all the possible adverse reactions/side effects for all the Medicines you take and that have been prescribed to you. Take any new Medicines after you have completely understood and accpet all the possible adverse reactions/side effects.   Please note  You were cared for by a hospitalist during your hospital stay. If you have any questions about your discharge medications or the care you received while you were in the hospital after you are discharged, you can call the unit and asked to speak with the hospitalist on call if the hospitalist that took care of you is not available. Once you are discharged, your primary care physician will handle any further medical issues. Please note that NO REFILLS for any discharge medications will be authorized once you are  discharged, as it is imperative that you return to your primary care physician (or establish a relationship with a primary care physician if you do not have one) for your aftercare needs so that they can reassess your need for medications and monitor your lab values.     Today  Chief Complaint  Patient presents with  . Emesis   Patient is resting comfortably. Anxious to leave. Denies any tremors. No overnight events. No other episodes of seizures. Mom at bedside  ROS:  CONSTITUTIONAL: Denies fevers, chills. Denies any fatigue, weakness.  EYES: Denies blurry vision, double vision, eye pain. EARS, NOSE, THROAT: Denies tinnitus, ear pain, hearing loss. RESPIRATORY: Denies cough, wheeze, shortness of breath.  CARDIOVASCULAR: Denies chest pain, palpitations, edema.  GASTROINTESTINAL: Denies nausea, vomiting, diarrhea, abdominal pain. Denies bright red blood per rectum. GENITOURINARY: Denies dysuria, hematuria. ENDOCRINE: Denies nocturia or thyroid problems. HEMATOLOGIC AND LYMPHATIC: Denies easy bruising or bleeding. SKIN: Denies rash or lesion. MUSCULOSKELETAL: Denies pain in neck, back, shoulder, knees, hips or arthritic symptoms.  NEUROLOGIC: Denies paralysis, paresthesias.  PSYCHIATRIC: Denies anxiety or depressive symptoms.   VITAL SIGNS:  Blood pressure 150/93, pulse 80, temperature 98.3 F (36.8 C), temperature source Oral, resp. rate 20, height 5\' 11"  (1.803  m), weight 81.647 kg (180 lb), SpO2 100 %.  I/O:    Intake/Output Summary (Last 24 hours) at 11/04/15 0931 Last data filed at 11/04/15 0814  Gross per 24 hour  Intake 3682.23 ml  Output      0 ml  Net 3682.23 ml    PHYSICAL EXAMINATION:  GENERAL:  30 y.o.-year-old patient lying in the bed with no acute distress.  EYES: Pupils equal, round, reactive to light and accommodation. No scleral icterus. Extraocular muscles intact.  HEENT: Head atraumatic, normocephalic. Oropharynx and nasopharynx clear.  NECK:  Supple,  no jugular venous distention. No thyroid enlargement, no tenderness.  LUNGS: Normal breath sounds bilaterally, no wheezing, rales,rhonchi or crepitation. No use of accessory muscles of respiration.  CARDIOVASCULAR: S1, S2 normal. No murmurs, rubs, or gallops.  ABDOMEN: Soft, non-tender, non-distended. Bowel sounds present. No organomegaly or mass.  EXTREMITIES: No pedal edema, cyanosis, or clubbing.  NEUROLOGIC: Cranial nerves II through XII are intact. Muscle strength 5/5 in all extremities. Sensation intact. Gait not checked.  PSYCHIATRIC: The patient is alert and oriented x 3.  SKIN: No obvious rash, lesion, or ulcer.   DATA REVIEW:   CBC  Recent Labs Lab 11/02/15 0418 11/03/15 0444  WBC 6.3  --   HGB 12.4* 12.0*  HCT 36.1*  --   PLT 263  --     Chemistries   Recent Labs Lab 11/01/15 2301  11/04/15 0534  NA 144  < > 136  K 3.2*  < > 3.6  CL 107  < > 106  CO2 25  < > 22  GLUCOSE 160*  < > 105*  BUN 6  < > <5*  CREATININE 0.81  < > 0.69  CALCIUM 9.3  < > 8.9  MG  --   < > 1.6*  AST 45*  --   --   ALT 34  --   --   ALKPHOS 101  --   --   BILITOT 0.5  --   --   < > = values in this interval not displayed.  Cardiac Enzymes No results for input(s): TROPONINI in the last 168 hours.  Microbiology Results  Results for orders placed or performed during the hospital encounter of 11/01/15  Urine culture     Status: None   Collection Time: 11/02/15  3:39 AM  Result Value Ref Range Status   Specimen Description URINE, CLEAN CATCH  Final   Special Requests Normal  Final   Culture NO GROWTH 2 DAYS  Final   Report Status 11/04/2015 FINAL  Final  MRSA PCR Screening     Status: None   Collection Time: 11/02/15  3:50 AM  Result Value Ref Range Status   MRSA by PCR NEGATIVE NEGATIVE Final    Comment:        The GeneXpert MRSA Assay (FDA approved for NASAL specimens only), is one component of a comprehensive MRSA colonization surveillance program. It is not intended to  diagnose MRSA infection nor to guide or monitor treatment for MRSA infections.     RADIOLOGY:  Ct Head Wo Contrast  11/02/2015  CLINICAL DATA:  Altered mental status. Presenting with delirium tremens. Previous admissions for alcohol withdrawal. EXAM: CT HEAD WITHOUT CONTRAST TECHNIQUE: Contiguous axial images were obtained from the base of the skull through the vertex without intravenous contrast. COMPARISON:  10/30/2015 FINDINGS: Ventricles and sulci appear symmetrical. No ventricular dilatation. No mass effect or midline shift. No abnormal extra-axial fluid collections. Gray-white matter junctions are  distinct. Basal cisterns are not effaced. No evidence of acute intracranial hemorrhage. No depressed skull fractures. Visualized paranasal sinuses and mastoid air cells are not opacified. IMPRESSION: No acute intracranial abnormalities. Electronically Signed   By: Burman NievesWilliam  Stevens M.D.   On: 11/02/2015 03:18    EKG:   Orders placed or performed during the hospital encounter of 10/30/15  . ED EKG  . ED EKG  . EKG 12-Lead  . EKG 12-Lead  . EKG 12-Lead  . EKG 12-Lead  . EKG 12-Lead  . EKG 12-Lead  . EKG 12-Lead  . EKG 12-Lead  . ED EKG  . ED EKG  . EKG 12-Lead  . EKG 12-Lead  . EKG      Management plans discussed with the patient, family and they are in agreement.  CODE STATUS:     Code Status Orders        Start     Ordered   11/02/15 0352  Full code   Continuous     11/02/15 0351    Code Status History    Date Active Date Inactive Code Status Order ID Comments User Context   11/02/2015 12:26 AM 11/02/2015  3:51 AM Full Code [id number]  Darci Currentandolph N Brown, MD ED   09/21/2015  3:48 AM 09/21/2015  5:52 PM Full Code [id number]  Oralia Manisavid Willis, MD Inpatient   09/21/2015  1:41 AM 09/21/2015  3:48 AM Full Code [id number]  Darci Currentandolph N Brown, MD ED   08/18/2015  1:34 AM 08/18/2015  6:06 PM Full Code [id number]  Marguarite ArbourJeffrey D Sparks, MD Inpatient   05/22/2015  4:00 PM 05/24/2015  1:18 PM Full Code  [id number]  Shaune PollackQing Chen, MD Inpatient   03/21/2015  9:07 AM 03/23/2015  7:45 PM Full Code [id number]  Crissie FiguresEdavally N Reddy, MD Inpatient   01/04/2015 12:52 PM 01/06/2015  1:06 PM Full Code [id number]  Gale Journeyatherine P Walsh, MD Inpatient      TOTAL TIME TAKING CARE OF THIS PATIENT: 45minutes.    @[facility] @  on 11/04/2015 at 9:31 AM  Between 7am to 6pm - Pager - [phone]  After 6pm go to [email or web address] - password [email or web address] [facility]  [facility]  Office  [phone]  CC: Primary care physician; Lanier EnsignSOLES, MEREDITH KEY, MD

## 2015-11-04 NOTE — Progress Notes (Signed)
[facility]         [place]   11/04/2015  Patient: Johnathan Rivera   Date of Birth:  1986/03/28  Date of admission:  11/01/2015  Date of Discharge  11/04/2015    To Whom it May Concern:   Johnathan Rivera  may return to work on 11/06/15.  PHYSICAL ACTIVITY:  Moderate,Patient unable to drive, operate heavy machinery, perform activities at heights and participate in water activities until release by outpatient physician.  If you have any questions or concerns, please don't hesitate to call.  Sincerely,   Ramonita LabGouru, Marria Mathison M.D Pager [phone] Office : [phone]   .

## 2015-11-04 NOTE — Progress Notes (Signed)
MEDICATION RELATED CONSULT NOTE   Pharmacy Consult for electrolyte monitoring   No Known Allergies  Patient Measurements: Height: 5\' 11"  (180.3 cm) Weight: 180 lb (81.647 kg) IBW/kg (Calculated) : 75.3   Vital Signs: Temp: 98.3 F (36.8 C) (05/06 0511) Temp Source: Oral (05/06 0511) BP: 150/93 mmHg (05/06 0511) Pulse Rate: 80 (05/06 0511) Intake/Output from previous day: 05/05 0701 - 05/06 0700 In: 2675.2 [P.O.:960; I.V.:1715.2] Out: -  Intake/Output from this shift:    Labs:  Recent Labs  11/01/15 2301 11/02/15 0418 11/03/15 0444 11/03/15 0529 11/04/15 0534  WBC 7.5 6.3  --   --   --   HGB 13.4 12.4* 12.0*  --   --   HCT 38.6* 36.1*  --   --   --   PLT 281 263  --   --   --   CREATININE 0.81 0.78  --   --  0.69  MG  --  2.0  --   --  1.6*  PHOS  --   --   --  3.3  --   ALBUMIN 4.3  --   --   --   --   PROT 8.0  --   --   --   --   AST 45*  --   --   --   --   ALT 34  --   --   --   --   ALKPHOS 101  --   --   --   --   BILITOT 0.5  --   --   --   --    Lab Results  Component Value Date   K 3.6 11/04/2015   Estimated Creatinine Clearance: 141.2 mL/min (by C-G formula based on Cr of 0.69).   Assessment: Pharmacy consulted for electrolyte management for 30 yo male admitted with delirium tremens. Patient currently receiving D5/NS with 20mEq of KCL/L at 12225mL/hr. Patient received potassium 40mEq PO x 1 this am 5/5.    Plan:  Will obtain follow up potassium level at 1800. (Potassium Chloride 40meq given x 1 at 1550 on 5/5)  Patient has history significant for seizures, will maintain magensium >/=2 during admission.   5/5 K+ @ 1750= 3.5. 5/6 K+= 3.6, Mag= 1.6.  Patient continues on IV fluids with KCL as above. Will give Magnesium Sulfate 2 gram IV x 1.   Pharmacy will continue to monitor and adjust per consult.   Alana Dayton A 11/04/2015,7:23 AM

## 2015-11-04 NOTE — Care Management Note (Signed)
Case Management Note  Patient Details  Name: Carnella GuadalajaraMichael M Arnaud MRN: [id number] Date of Birth: 12-25-1985  Subjective/Objective:         [facility] but he left the hospital before this writer could [facility]. Will try to contact him at home.            Action/Plan:   Expected Discharge Date:                  Expected Discharge Plan:     In-House Referral:     Discharge planning Services     Post Acute Care Choice:    Choice offered to:     DME Arranged:    DME Agency:     HH Arranged:    HH Agency:     Status of Service:     Medicare Important Message Given:    Date Medicare IM Given:    Medicare IM give by:    Date Additional Medicare IM Given:    Additional Medicare Important Message give by:     If discussed at Long Length of Stay Meetings, dates discussed:    Additional Comments:  Lataysha Vohra A, RN 11/04/2015, 12:05 PM

## 2017-05-11 IMAGING — CT CT CERVICAL SPINE W/O CM
2 of 3 series · 8 of 14 positions shown, 9 images · non-contrast
Comparison: None.

CLINICAL DATA: Recent seizure activity with confusion and neck
pain, initial encounter

EXAM:
CT HEAD WITHOUT CONTRAST
CT CERVICAL SPINE WITHOUT CONTRAST
TECHNIQUE: Multidetector CT imaging of the head and cervical spine was
performed following the standard protocol without intravenous
contrast. Multiplanar CT image reconstructions of the cervical spine
were also generated.

[Series 5: c spine soft · axial · 0.31mm/px · z∈[+254,+362]mm · 4 of 90 slices shown]
[im 18/90  soft-tissue]
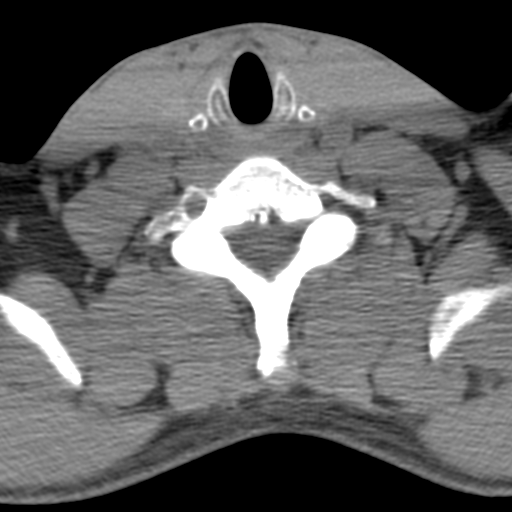
[im 36/90  soft-tissue]
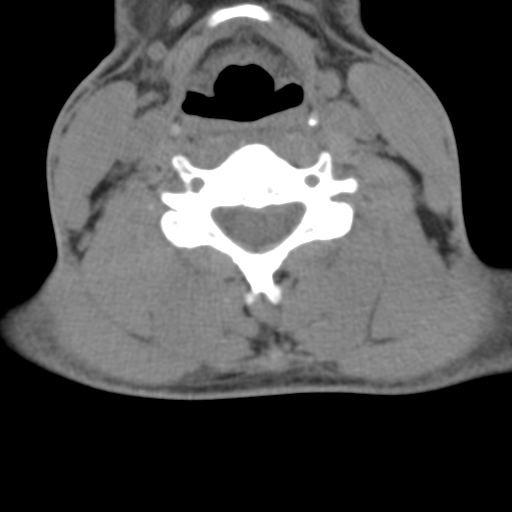
[im 54/90  soft-tissue]
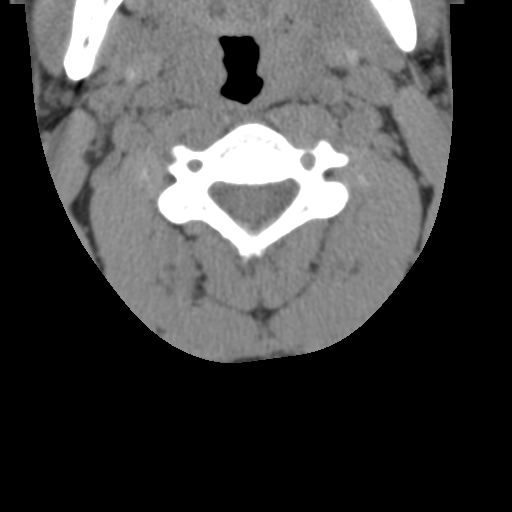
[im 72/90  soft-tissue]
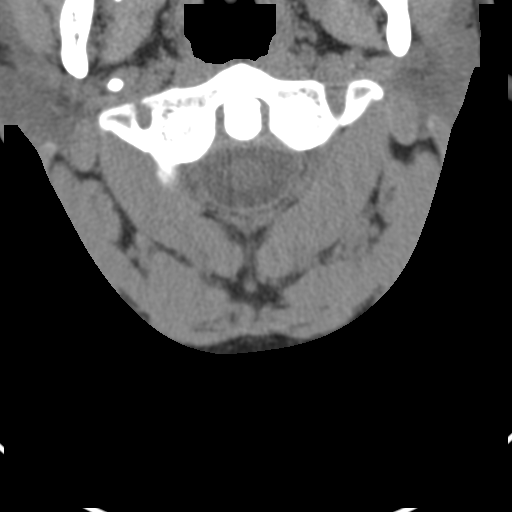

[Series 10: orthogonal axials · axial · 0.21mm/px · z∈[+242,+357]mm · 4 of 98 slices shown, 5 images]
[im 20/98  soft-tissue]
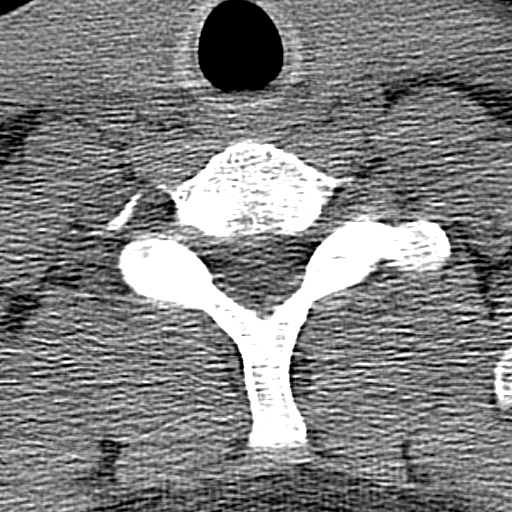
[im 20/98  bone]
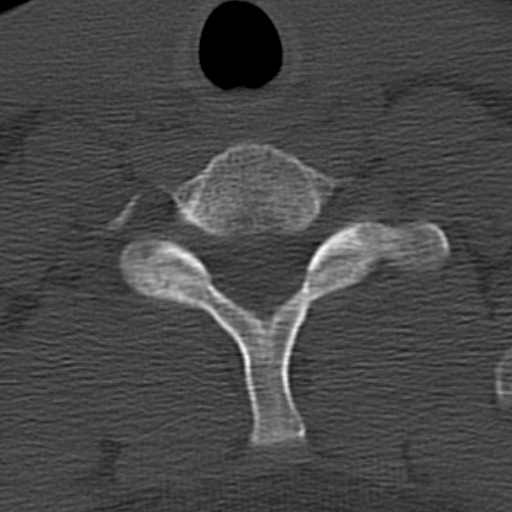
[im 39/98  bone]
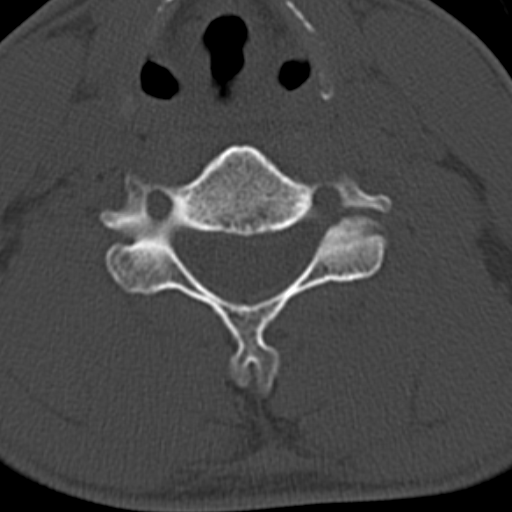
[im 59/98  bone]
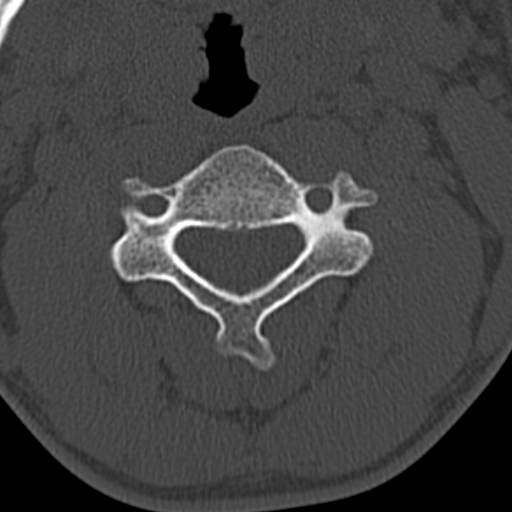
[im 78/98  bone]
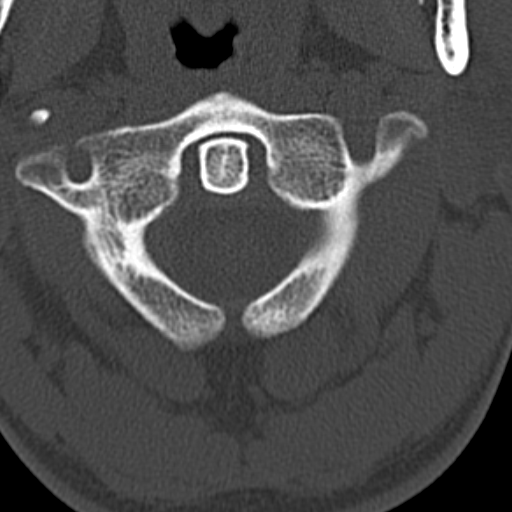

[8 of 14 positions shown; findings below may reference images not displayed]

FINDINGS: CT HEAD FINDINGS

Bony calvarium is intact. No findings to suggest acute hemorrhage,
acute infarction or space-occupying mass lesion are noted.

CT CERVICAL SPINE FINDINGS

Seven cervical segments are well visualized. Vertebral body height
is well maintained. No acute fracture or acute facet abnormality is
seen. The odontoid is within normal limits. Minimal osteophytic
changes are noted. No gross soft tissue abnormality is seen.
IMPRESSION: CT of the head:  No acute intracranial abnormality noted.

CT of the cervical spine: Minimal degenerative change. No acute
abnormality noted.

## 2021-09-18 DIAGNOSIS — Z8719 Personal history of other diseases of the digestive system: Secondary | ICD-10-CM | POA: Insufficient documentation

## 2021-09-18 DIAGNOSIS — F17200 Nicotine dependence, unspecified, uncomplicated: Secondary | ICD-10-CM | POA: Insufficient documentation

## 2021-10-02 DIAGNOSIS — R002 Palpitations: Secondary | ICD-10-CM | POA: Insufficient documentation

## 2021-10-02 DIAGNOSIS — F1021 Alcohol dependence, in remission: Secondary | ICD-10-CM | POA: Insufficient documentation

## 2021-10-02 DIAGNOSIS — R7301 Impaired fasting glucose: Secondary | ICD-10-CM | POA: Insufficient documentation

## 2023-11-17 ENCOUNTER — Other Ambulatory Visit: Payer: Self-pay

## 2023-11-17 DIAGNOSIS — Z021 Encounter for pre-employment examination: Secondary | ICD-10-CM

## 2023-11-17 NOTE — Progress Notes (Signed)
 [employer] pre-employment cleared.

## 2023-11-27 ENCOUNTER — Emergency Department
Admission: EM | Admit: 2023-11-27 | Discharge: 2023-11-28 | Disposition: A | Discharge status: Home / Self Care | Payer: Self-pay | Attending: Emergency Medicine | Admitting: Emergency Medicine

## 2023-11-27 ENCOUNTER — Encounter: Payer: Self-pay | Admitting: Emergency Medicine

## 2023-11-27 ENCOUNTER — Emergency Department: Payer: Self-pay

## 2023-11-27 ENCOUNTER — Other Ambulatory Visit: Payer: Self-pay

## 2023-11-27 DIAGNOSIS — R569 Unspecified convulsions: Secondary | ICD-10-CM | POA: Insufficient documentation

## 2023-11-27 DIAGNOSIS — M25511 Pain in right shoulder: Secondary | ICD-10-CM | POA: Insufficient documentation

## 2023-11-27 DIAGNOSIS — I10 Essential (primary) hypertension: Secondary | ICD-10-CM | POA: Insufficient documentation

## 2023-11-27 DIAGNOSIS — R000 Tachycardia, unspecified: Secondary | ICD-10-CM | POA: Insufficient documentation

## 2023-11-27 DIAGNOSIS — J45909 Unspecified asthma, uncomplicated: Secondary | ICD-10-CM | POA: Insufficient documentation

## 2023-11-27 MED ORDER — SODIUM CHLORIDE 0.9 % IV BOLUS
1000.0000 mL | Freq: Once | INTRAVENOUS | Status: AC
  Administered 2023-11-28: 1000 mL via INTRAVENOUS

## 2023-11-27 MED ORDER — LORAZEPAM 2 MG/ML IJ SOLN
1.0000 mg | Freq: Once | INTRAMUSCULAR | Status: AC
  Administered 2023-11-27: 1 mg via INTRAVENOUS
  Filled 2023-11-27: qty 1

## 2023-11-27 NOTE — ED Provider Notes (Signed)
 [facility] Provider Note    Event Date/Time   First MD Initiated Contact with Patient 11/27/23 2337     (approximate)   History   Seizures   HPI {Remember to add pertinent medical, surgical, social, and/or OB history to HPI:1} Johnathan Rivera is a 38 y.o. 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   Seizure (HCC) 05/22/2015   Alcohol withdrawal (HCC) 05/22/2015   Peripheral neuropathy 05/10/2015   Seizure disorder (HCC) 05/10/2015   Acute alcoholic pancreatitis 03/21/2015   Hypokalemia 03/21/2015   Hypomagnesemia 03/21/2015   Alcohol abuse 01/04/2015   Alcohol withdrawal seizure (HCC) 01/04/2015   Anxiety 01/04/2015   Alcoholic hepatitis 01/04/2015   Alcohol dependence (HCC) 01/04/2015   Elevated LFTs    Polysubstance abuse [facility])      Past Surgical History   Past Surgical History:  Procedure Laterality Date   ESOPHAGOGASTRODUODENOSCOPY Left 08/18/2015   Procedure: ESOPHAGOGASTRODUODENOSCOPY (EGD);  Surgeon: Stephens Eis, MD;  Location: [facility] ENDOSCOPY;  Service: Endoscopy;  Laterality: Left;   NO PAST SURGERIES       Home Medications   Prior to Admission medications   Medication Sig Start Date End Date Taking? Authorizing Provider  amoxicillin  (AMOXIL ) 500 MG capsule Take 1 capsule (500 mg total) by mouth 3 (three) times daily. 11/04/15   Gouru, Aruna, MD  busPIRone   (BUSPAR ) 10 MG tablet Take 1 tablet (10 mg total) by mouth 3 (three) times daily. 05/10/15   Clapacs, Elida Grounds, MD  carbamazepine  (TEGRETOL ) 200 MG tablet Take 1.5 tablets (300 mg total) by mouth 2 (two) times daily. 11/04/15   Gouru, Aruna, MD  folic acid  (FOLVITE ) 1 MG tablet Take 1 tablet (1 mg total) by mouth daily. 11/04/15   Gouru, Aruna, MD  hydrALAZINE  (APRESOLINE ) 25 MG tablet Take 1 tablet (25 mg total) by mouth 3 (three) times daily. 03/23/15   Vachhani, Vaibhavkumar, MD  hydrOXYzine  (VISTARIL ) 50 MG capsule Take 1 capsule (50 mg total) by mouth 3 (three) times daily as needed for anxiety. 07/21/15   Ainsley Houston, MD  magnesium  30 MG tablet Take 1 tablet (30 mg total) by mouth 2 (two) times daily. 11/04/15   Gouru, Aruna, MD  Multiple Vitamin (MULTIVITAMIN WITH MINERALS) TABS tablet Take 1 tablet by mouth daily. 03/23/15   Vachhani, Vaibhavkumar, MD  nicotine  (NICODERM CQ  - DOSED IN MG/24 HOURS) 14 mg/24hr patch Place 1 patch (14 mg total) onto the skin daily. 11/04/15   Gouru, Aruna, MD  pneumococcal 23 valent vaccine (PNU-IMMUNE) 25 MCG/0.5ML injection Inject 0.5 mLs into the muscle tomorrow at 10 am. 11/04/15   Gouru, Aruna, MD  thiamine  100 MG tablet Take 1 tablet (100 mg total) by mouth daily. 11/04/15   Gouru, Aruna, MD     Allergies  Lisinopril   Family History   Family History  Problem Relation Age of Onset   Diabetes Mother    Liver disease Neg Hx    Colon cancer Neg Hx    Lung cancer Father      Physical Exam  Triage Vital Signs: ED Triage Vitals  Encounter Vitals Group     BP 11/27/23 2326 (!) 177/112     Systolic BP Percentile --      Diastolic BP Percentile --      Pulse Rate 11/27/23 2326 (!) 130     Resp 11/27/23 2326 18     Temp 11/27/23 2326 98.8 F (37.1 C)     Temp Source 11/27/23 2326 Oral     SpO2 11/27/23 2326 95 %     Weight 11/27/23 2322 155 lb (70.3 kg)     Height 11/27/23 2322 5\' 11"  (1.803 m)     Head Circumference --      Peak Flow --      Pain Score  11/27/23 2322 5     Pain Loc --      Pain Education --      Exclude from Growth Chart --     Updated Vital Signs: BP (!) 177/112 (BP Location: Left Arm)   Pulse (!) 130   Temp 98.8 F (37.1 C) (Oral)   Resp 18   Ht 5\' 11"  (1.803 m)   Wt 70.3 kg   SpO2 95%   BMI 21.62 kg/m   {Only need to document appropriate and relevant physical exam:1} General: Awake, no distress. *** CV:  Good peripheral perfusion. *** Resp:  Normal effort. *** Abd:  No distention. *** Other:  ***   ED Results / Procedures / Treatments  Labs (all labs ordered are listed, but only abnormal results are displayed) Labs Reviewed  CBC WITH DIFFERENTIAL/PLATELET  COMPREHENSIVE METABOLIC PANEL WITH GFR  ETHANOL  URINALYSIS, ROUTINE W REFLEX MICROSCOPIC  URINE DRUG SCREEN, QUALITATIVE ([facility] ONLY)  ACETAMINOPHEN  LEVEL  SALICYLATE LEVEL  CBG MONITORING, ED     EKG  ***   RADIOLOGY *** {You MUST document your own interpretation of imaging, as well as the fact that you reviewed the radiologist's report!:1}  Official radiology report(s): No results found.   PROCEDURES:  Critical Care performed: {CriticalCareYesNo:19197::"Yes, see critical care procedure note(s)","No"}  Procedures   MEDICATIONS ORDERED IN ED: Medications  sodium chloride  0.9 % bolus 1,000 mL (has no administration in time range)  LORazepam  (ATIVAN ) injection 1 mg (has no administration in time range)     IMPRESSION / MDM / ASSESSMENT AND PLAN / ED COURSE  I reviewed the triage vital signs and the nursing notes.                              Differential diagnosis includes, but is not limited to, ***  Patient's presentation is most consistent with {EM COPA:27473}  {If the patient is on the monitor, remove the brackets and asterisks on the sentence below and remember to document it as a Procedure as well. Otherwise delete the sentence below:1} {**The patient is on the cardiac monitor to evaluate for evidence of  arrhythmia and/or significant heart rate changes.**}  {Remember to include, when applicable, any/all of the following data: independent review of imaging independent review of labs (comment specifically on pertinent positives and negatives) review of specific prior hospitalizations, PCP/specialist notes, etc. discuss meds given and prescribed document any discussion with consultants (including hospitalists) any clinical decision tools you used and  why (PECARN, NEXUS, etc.) did you consider admitting the patient? document social determinants of health affecting patient's care (homelessness, inability to follow up in a timely fashion, etc) document any pre-existing conditions increasing risk on current visit (e.g. diabetes and HTN increasing danger of high-risk chest pain/ACS) describes what meds you gave (especially parenteral) and why any other interventions?:1}      FINAL CLINICAL IMPRESSION(S) / ED DIAGNOSES   Final diagnoses:  Seizure (HCC)     Rx / DC Orders   ED Discharge Orders     None        Note:  This document was prepared using Dragon voice recognition software and may include unintentional dictation errors.

## 2023-11-27 NOTE — ED Triage Notes (Signed)
 Patient ambulatory to triage with steady gait, without difficulty or distress noted; SO reports heard a noise and found pt had fallen off couch and was shaking approx ; c/o rt shoulder pain; hx alcohol induced seizures but has not drank in 3seizures; took unisom 5 tab at 830pm and unsure if that was cause because he normally takes that

## 2023-11-28 LAB — COMPREHENSIVE METABOLIC PANEL WITH GFR
ALT: 32 U/L (ref 0–44)
AST: 31 U/L (ref 15–41)
Albumin: 4.1 g/dL (ref 3.5–5.0)
Alkaline Phosphatase: 90 U/L (ref 38–126)
Anion gap: 14 (ref 5–15)
BUN: 18 mg/dL (ref 6–20)
CO2: 22 mmol/L (ref 22–32)
Calcium: 8.9 mg/dL (ref 8.9–10.3)
Chloride: 99 mmol/L (ref 98–111)
Creatinine, Ser: 1.07 mg/dL (ref 0.61–1.24)
GFR, Estimated: >60 mL/min (ref >60)
Glucose, Bld: 132 mg/dL — ABNORMAL HIGH (ref 70–99)
Potassium: 4 mmol/L (ref 3.5–5.1)
Sodium: 135 mmol/L (ref 135–145)
Total Bilirubin: 0.6 mg/dL (ref 0.0–1.2)
Total Protein: 7.7 g/dL (ref 6.5–8.1)

## 2023-11-28 LAB — URINALYSIS, ROUTINE W REFLEX MICROSCOPIC
Bilirubin Urine: NEGATIVE
Glucose, UA: NEGATIVE mg/dL
Hgb urine dipstick: NEGATIVE
Ketones, ur: NEGATIVE mg/dL
Leukocytes,Ua: NEGATIVE
Nitrite: NEGATIVE
Protein, ur: NEGATIVE mg/dL
Specific Gravity, Urine: 1.005 (ref 1.005–1.030)
pH: 5 (ref 5.0–8.0)

## 2023-11-28 LAB — CBC WITH DIFFERENTIAL/PLATELET
Abs Immature Granulocytes: 0.05 10*3/uL (ref 0.00–0.07)
Basophils Absolute: 0 10*3/uL (ref 0.0–0.1)
Basophils Relative: 0 %
Eosinophils Absolute: 0.2 10*3/uL (ref 0.0–0.5)
Eosinophils Relative: 1 %
HCT: 33.7 % — ABNORMAL LOW (ref 39.0–52.0)
Hemoglobin: 11.3 g/dL — ABNORMAL LOW (ref 13.0–17.0)
Immature Granulocytes: 0 %
Lymphocytes Relative: 23 %
Lymphs Abs: 3.2 10*3/uL (ref 0.7–4.0)
MCH: 28.8 pg (ref 26.0–34.0)
MCHC: 33.5 g/dL (ref 30.0–36.0)
MCV: 86 fL (ref 80.0–100.0)
Monocytes Absolute: 1.2 10*3/uL — ABNORMAL HIGH (ref 0.1–1.0)
Monocytes Relative: 9 %
Neutro Abs: 9.1 10*3/uL — ABNORMAL HIGH (ref 1.7–7.7)
Neutrophils Relative %: 67 %
Platelets: 236 10*3/uL (ref 150–400)
RBC: 3.92 MIL/uL — ABNORMAL LOW (ref 4.22–5.81)
RDW: 13.3 % (ref 11.5–15.5)
WBC: 13.8 10*3/uL — ABNORMAL HIGH (ref 4.0–10.5)
nRBC: 0 % (ref 0.0–0.2)

## 2023-11-28 LAB — URINE DRUG SCREEN, QUALITATIVE (ARMC ONLY)
Amphetamines, Ur Screen: NOT DETECTED
Barbiturates, Ur Screen: NOT DETECTED
Benzodiazepine, Ur Scrn: NOT DETECTED
Cannabinoid 50 Ng, Ur ~~LOC~~: POSITIVE — AB
Cocaine Metabolite,Ur ~~LOC~~: NOT DETECTED
MDMA (Ecstasy)Ur Screen: NOT DETECTED
Methadone Scn, Ur: NOT DETECTED
Opiate, Ur Screen: NOT DETECTED
Phencyclidine (PCP) Ur S: NOT DETECTED
Tricyclic, Ur Screen: POSITIVE — AB

## 2023-11-28 LAB — ETHANOL: Alcohol, Ethyl (B): <15 mg/dL (ref <15)

## 2023-11-28 LAB — ACETAMINOPHEN LEVEL: Acetaminophen (Tylenol), Serum: <10 ug/mL — ABNORMAL LOW (ref 10–30)

## 2023-11-28 LAB — SALICYLATE LEVEL: Salicylate Lvl: <7 mg/dL — ABNORMAL LOW (ref 7.0–30.0)

## 2023-11-28 LAB — CBG MONITORING, ED: Glucose-Capillary: 107 mg/dL — ABNORMAL HIGH (ref 70–99)

## 2023-11-28 MED ORDER — LORAZEPAM 1 MG PO TABS: 1.0000 mg | ORAL_TABLET | Freq: Three times a day (TID) | ORAL | 0 refills | Status: AC | PRN

## 2023-11-28 NOTE — Discharge Instructions (Signed)
 You may take Ativan  as needed for anxiety.  Do not drive or operate heavy machinery until seen by the neurologist.  Wear sling as needed for comfort.  Return to the ER for worsening symptoms, persistent vomiting, lethargy or other concerns.

## 2024-04-26 DIAGNOSIS — Z1331 Encounter for screening for depression: Secondary | ICD-10-CM | POA: Diagnosis not present

## 2024-04-26 DIAGNOSIS — F1911 Other psychoactive substance abuse, in remission: Secondary | ICD-10-CM | POA: Diagnosis not present

## 2024-04-26 DIAGNOSIS — F411 Generalized anxiety disorder: Secondary | ICD-10-CM | POA: Diagnosis not present

## 2024-04-26 DIAGNOSIS — R569 Unspecified convulsions: Secondary | ICD-10-CM | POA: Diagnosis not present

## 2024-04-26 DIAGNOSIS — Z8659 Personal history of other mental and behavioral disorders: Secondary | ICD-10-CM | POA: Diagnosis not present

## 2024-04-26 DIAGNOSIS — Z87898 Personal history of other specified conditions: Secondary | ICD-10-CM | POA: Diagnosis not present

## 2024-04-26 DIAGNOSIS — F1011 Alcohol abuse, in remission: Secondary | ICD-10-CM | POA: Diagnosis not present
